# Patient Record
Sex: Male | Born: 1945 | Race: Black or African American | Hispanic: No | Smoking: Never smoker
Health system: Southern US, Community
[De-identification: ages and names within clinical notes are randomized; demographics above are authoritative.]

## PROBLEM LIST (undated history)

## (undated) DIAGNOSIS — I639 Cerebral infarction, unspecified: Secondary | ICD-10-CM

## (undated) DIAGNOSIS — E559 Vitamin D deficiency, unspecified: Secondary | ICD-10-CM

## (undated) DIAGNOSIS — Z550 Illiteracy and low-level literacy: Secondary | ICD-10-CM

## (undated) DIAGNOSIS — I10 Essential (primary) hypertension: Secondary | ICD-10-CM

## (undated) DIAGNOSIS — Z1810 Retained metal fragments, unspecified: Secondary | ICD-10-CM

## (undated) DIAGNOSIS — I2699 Other pulmonary embolism without acute cor pulmonale: Secondary | ICD-10-CM

## (undated) DIAGNOSIS — I5022 Chronic systolic (congestive) heart failure: Secondary | ICD-10-CM

## (undated) DIAGNOSIS — R7303 Prediabetes: Secondary | ICD-10-CM

## (undated) DIAGNOSIS — E785 Hyperlipidemia, unspecified: Secondary | ICD-10-CM

## (undated) DIAGNOSIS — I2510 Atherosclerotic heart disease of native coronary artery without angina pectoris: Secondary | ICD-10-CM

## (undated) DIAGNOSIS — M109 Gout, unspecified: Secondary | ICD-10-CM

## (undated) DIAGNOSIS — Z8673 Personal history of transient ischemic attack (TIA), and cerebral infarction without residual deficits: Secondary | ICD-10-CM

## (undated) DIAGNOSIS — I119 Hypertensive heart disease without heart failure: Secondary | ICD-10-CM

## (undated) DIAGNOSIS — I429 Cardiomyopathy, unspecified: Secondary | ICD-10-CM

## (undated) HISTORY — DX: Retained metal fragments, unspecified: Z18.10

## (undated) HISTORY — DX: Cerebral infarction, unspecified: I63.9

## (undated) HISTORY — DX: Other pulmonary embolism without acute cor pulmonale: I26.99

## (undated) HISTORY — DX: Vitamin D deficiency, unspecified: E55.9

## (undated) HISTORY — DX: Prediabetes: R73.03

## (undated) HISTORY — DX: Hyperlipidemia, unspecified: E78.5

## (undated) HISTORY — DX: Gout, unspecified: M10.9

---

## 1979-01-14 HISTORY — PX: OTHER SURGICAL HISTORY: SHX169

## 1988-05-15 HISTORY — PX: REVISION TOTAL HIP ARTHROPLASTY: SHX766

## 2006-06-23 ENCOUNTER — Emergency Department (HOSPITAL_COMMUNITY): Admission: EM | Admit: 2006-06-23 | Discharge: 2006-06-23 | Payer: Self-pay | Admitting: Family Medicine

## 2011-10-14 DIAGNOSIS — I639 Cerebral infarction, unspecified: Secondary | ICD-10-CM

## 2011-10-14 HISTORY — DX: Cerebral infarction, unspecified: I63.9

## 2011-10-17 ENCOUNTER — Emergency Department (HOSPITAL_COMMUNITY): Payer: 59

## 2011-10-17 ENCOUNTER — Encounter (HOSPITAL_COMMUNITY): Payer: Self-pay | Admitting: Emergency Medicine

## 2011-10-17 ENCOUNTER — Inpatient Hospital Stay (HOSPITAL_COMMUNITY)
Admission: EM | Admit: 2011-10-17 | Discharge: 2011-10-20 | DRG: 065 | Disposition: A | Discharge status: Home / Self Care | Payer: 59 | Source: Ambulatory Visit | Attending: Internal Medicine | Admitting: Internal Medicine

## 2011-10-17 DIAGNOSIS — Z91199 Patient's noncompliance with other medical treatment and regimen due to unspecified reason: Secondary | ICD-10-CM

## 2011-10-17 DIAGNOSIS — I2789 Other specified pulmonary heart diseases: Secondary | ICD-10-CM | POA: Diagnosis present

## 2011-10-17 DIAGNOSIS — I10 Essential (primary) hypertension: Secondary | ICD-10-CM | POA: Diagnosis present

## 2011-10-17 DIAGNOSIS — D696 Thrombocytopenia, unspecified: Secondary | ICD-10-CM | POA: Diagnosis present

## 2011-10-17 DIAGNOSIS — M311 Thrombotic microangiopathy: Secondary | ICD-10-CM

## 2011-10-17 DIAGNOSIS — I6350 Cerebral infarction due to unspecified occlusion or stenosis of unspecified cerebral artery: Principal | ICD-10-CM | POA: Diagnosis present

## 2011-10-17 DIAGNOSIS — Z9119 Patient's noncompliance with other medical treatment and regimen: Secondary | ICD-10-CM

## 2011-10-17 DIAGNOSIS — I639 Cerebral infarction, unspecified: Secondary | ICD-10-CM

## 2011-10-17 DIAGNOSIS — Z8249 Family history of ischemic heart disease and other diseases of the circulatory system: Secondary | ICD-10-CM

## 2011-10-17 DIAGNOSIS — I6340 Cerebral infarction due to embolism of unspecified cerebral artery: Secondary | ICD-10-CM

## 2011-10-17 DIAGNOSIS — I428 Other cardiomyopathies: Secondary | ICD-10-CM | POA: Diagnosis present

## 2011-10-17 DIAGNOSIS — Z833 Family history of diabetes mellitus: Secondary | ICD-10-CM

## 2011-10-17 HISTORY — DX: Essential (primary) hypertension: I10

## 2011-10-17 LAB — CBC
HCT: 43 % (ref 39.0–52.0)
MCH: 30.4 pg (ref 26.0–34.0)
MCHC: 34.2 g/dL (ref 30.0–36.0)
MCV: 89 fL (ref 78.0–100.0)
Platelets: 147 10*3/uL — ABNORMAL LOW (ref 150–400)
RDW: 13.3 % (ref 11.5–15.5)

## 2011-10-17 LAB — BASIC METABOLIC PANEL
CO2: 24 mEq/L (ref 19–32)
Calcium: 9.4 mg/dL (ref 8.4–10.5)
Creatinine, Ser: 1.36 mg/dL — ABNORMAL HIGH (ref 0.50–1.35)
GFR calc Af Amer: 62 mL/min — ABNORMAL LOW (ref >90)
GFR calc non Af Amer: 54 mL/min — ABNORMAL LOW (ref >90)
Sodium: 141 mEq/L (ref 135–145)

## 2011-10-17 LAB — DIFFERENTIAL
Basophils Absolute: 0 10*3/uL (ref 0.0–0.1)
Basophils Relative: 0 % (ref 0–1)
Eosinophils Absolute: 0.3 10*3/uL (ref 0.0–0.7)
Eosinophils Relative: 3 % (ref 0–5)
Monocytes Absolute: 1 10*3/uL (ref 0.1–1.0)

## 2011-10-17 MED ORDER — SODIUM CHLORIDE 0.9 % IV SOLN: INTRAVENOUS | Status: AC

## 2011-10-17 MED ORDER — ASPIRIN 300 MG RE SUPP
300.0000 mg | Freq: Every day | RECTAL | Status: DC
  Filled 2011-10-17 (×2): qty 1

## 2011-10-17 MED ORDER — SENNOSIDES-DOCUSATE SODIUM 8.6-50 MG PO TABS: 1.0000 | ORAL_TABLET | Freq: Every evening | ORAL | Status: DC | PRN

## 2011-10-17 MED ORDER — ENOXAPARIN SODIUM 30 MG/0.3ML ~~LOC~~ SOLN
30.0000 mg | SUBCUTANEOUS | Status: DC
  Administered 2011-10-18: 30 mg via SUBCUTANEOUS
  Filled 2011-10-17 (×2): qty 0.3

## 2011-10-17 MED ORDER — ASPIRIN 325 MG PO TABS
325.0000 mg | ORAL_TABLET | Freq: Every day | ORAL | Status: DC
  Administered 2011-10-18 – 2011-10-20 (×3): 325 mg via ORAL
  Filled 2011-10-17 (×4): qty 1

## 2011-10-17 MED ORDER — SODIUM CHLORIDE 0.9 % IV SOLN
INTRAVENOUS | Status: DC
Start: 2011-10-17 — End: 2011-10-19
  Administered 2011-10-18 – 2011-10-19 (×4): via INTRAVENOUS

## 2011-10-17 MED ORDER — ACETAMINOPHEN 325 MG PO TABS
650.0000 mg | ORAL_TABLET | Freq: Once | ORAL | Status: AC
  Administered 2011-10-17: 650 mg via ORAL
  Filled 2011-10-17: qty 2
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
## 2011-10-17 NOTE — ED Notes (Signed)
Patient assigned CDU 10  This nurse voiced concern for patient being away from the nurses station after being told of his inability to control the right leg.  Patient alert and oriented but continues to request to go home.  Jon notified of this nurses concerns.

## 2011-10-17 NOTE — ED Notes (Signed)
Patient states if he covers his right eye he sees 1 person on the TV, if he covers his left eye he sees 1 person on the TV but when both eyes uncovered he sees 2 people on the TV, his right eye wonders at times.

## 2011-10-17 NOTE — ED Notes (Signed)
Pt hx of Hypertension.  No meds .  Blurred vision, dizziness and hypertension symptoms since 7am today.  Denies n/v d.  18 Left forearm. 12 lead no significant changes.  Pt report porks rines that seemed to begin process

## 2011-10-17 NOTE — ED Notes (Signed)
X ray stated when they stood him up for his xray, his right leg was no cooperating.  Stated his leg was limp and did not have control of the leg.  Dr. Bebe Shaggy notified.
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
## 2011-10-17 NOTE — ED Notes (Signed)
Report given to Samantha

## 2011-10-17 NOTE — ED Notes (Signed)
Patient presented via EMS with c/o blurred vision and hypertension.  Stated he ate some pork rines and then he noticed feeling like his pressure was up.  Has history of same but has not been taking any medication. Denies headache, nausea or vomiting.

## 2011-10-18 ENCOUNTER — Encounter (HOSPITAL_COMMUNITY): Payer: Self-pay | Admitting: General Practice

## 2011-10-18 DIAGNOSIS — M311 Thrombotic microangiopathy: Secondary | ICD-10-CM

## 2011-10-18 DIAGNOSIS — I10 Essential (primary) hypertension: Secondary | ICD-10-CM

## 2011-10-18 DIAGNOSIS — I369 Nonrheumatic tricuspid valve disorder, unspecified: Secondary | ICD-10-CM

## 2011-10-18 DIAGNOSIS — I6340 Cerebral infarction due to embolism of unspecified cerebral artery: Secondary | ICD-10-CM

## 2011-10-18 DIAGNOSIS — Z9119 Patient's noncompliance with other medical treatment and regimen: Secondary | ICD-10-CM

## 2011-10-18 LAB — COMPREHENSIVE METABOLIC PANEL
BUN: 16 mg/dL (ref 6–23)
Calcium: 9.4 mg/dL (ref 8.4–10.5)
Creatinine, Ser: 1.28 mg/dL (ref 0.50–1.35)
GFR calc Af Amer: 67 mL/min — ABNORMAL LOW (ref >90)
Glucose, Bld: 118 mg/dL — ABNORMAL HIGH (ref 70–99)
Total Protein: 7 g/dL (ref 6.0–8.3)

## 2011-10-18 LAB — LIPID PANEL
Cholesterol: 146 mg/dL (ref 0–200)
Total CHOL/HDL Ratio: 2.5 RATIO
Triglycerides: 65 mg/dL (ref <150)
VLDL: 14 mg/dL (ref 0–40)

## 2011-10-18 MED ORDER — HYDRALAZINE HCL 20 MG/ML IJ SOLN
10.0000 mg | Freq: Four times a day (QID) | INTRAMUSCULAR | Status: DC | PRN
  Filled 2011-10-18: qty 0.5

## 2011-10-18 MED ORDER — HYDRALAZINE HCL 20 MG/ML IJ SOLN
10.0000 mg | Freq: Once | INTRAMUSCULAR | Status: AC
  Administered 2011-10-18: 10 mg via INTRAVENOUS
  Filled 2011-10-18: qty 0.5

## 2011-10-18 MED ORDER — AMLODIPINE BESYLATE 5 MG PO TABS
5.0000 mg | ORAL_TABLET | Freq: Every day | ORAL | Status: DC
  Administered 2011-10-18: 5 mg via ORAL
  Filled 2011-10-18 (×2): qty 1

## 2011-10-18 MED ORDER — ATORVASTATIN CALCIUM 10 MG PO TABS
10.0000 mg | ORAL_TABLET | Freq: Every day | ORAL | Status: DC
  Administered 2011-10-18 – 2011-10-19 (×2): 10 mg via ORAL
  Filled 2011-10-18 (×3): qty 1

## 2011-10-18 NOTE — ED Provider Notes (Signed)
Medical screening examination/treatment/procedure(s) were conducted as a shared visit with non-physician practitioner(s) and myself.  I personally evaluated the patient during the encounter   Joya Gaskins, MD 10/18/11 [phone]

## 2011-10-18 NOTE — Progress Notes (Signed)
SLP Cancellation Note  Evaluation cancelled today due to patient in MRI. Will f/u.  [name] MA, CCC-SLP [phone]   [name] [name] 10/18/2011, 9:38 AM

## 2011-10-18 NOTE — Progress Notes (Signed)
*  PRELIMINARY RESULTS* Vascular Ultrasound Carotid Duplex (Doppler) has been completed.  Preliminary findings: Bilateral no significant ICA stenosis with antegrade vertebral flow.  Farrel Demark, RDMS 10/18/2011, 9:52 AM
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
## 2011-10-18 NOTE — Progress Notes (Signed)
  Echocardiogram 2D Echocardiogram has been performed.  Dennis [name] [facility] 10/18/2011, 9:40 AM

## 2011-10-18 NOTE — Progress Notes (Signed)
Occupational Therapy Evaluation Patient Details Name: Dennis [name] MRN: [id number] DOB: January 10, 1946 Today's Date: 10/18/2011 Time: 1100-1119 OT Time Calculation (min): 19 min  OT Assessment / Plan / Recommendation Clinical Impression  Pt s/p cerebellar CVA and presenting with double vision. Pt will benefit from acute OT to address below problem list in prep for d/c home with assist from significant other.     OT Assessment  Patient needs continued OT Services    Follow Up Recommendations  Home health OT;Supervision/Assistance - 24 hour    Barriers to Discharge      Equipment Recommendations  None recommended by PT    Recommendations for Other Services    Frequency  Min 3X/week    Precautions / Restrictions Precautions Precautions: None Restrictions Weight Bearing Restrictions: No   Pertinent Vitals/Pain See vitals     ADL  Lower Body Bathing: Simulated;Modified independent Where Assessed - Lower Body Bathing: Unsupported sit to stand Lower Body Dressing: Modified independent;Performed Where Assessed - Lower Body Dressing: Unsupported sit to stand Toilet Transfer: Performed;Supervision/safety Toilet Transfer Method: Sit to Barista:  (bed) Equipment Used: Gait belt Transfers/Ambulation Related to ADLs: Supervision due to lines and leads.   ADL Comments: Mod I for increased time due to lines and leads and due to blurred/double vision.      OT Diagnosis: Generalized weakness;Disturbance of vision  OT Problem List: Impaired balance (sitting and/or standing);Impaired vision/perception OT Treatment Interventions: Self-care/ADL training;Therapeutic activities;Patient/family education;Balance training;Visual/perceptual remediation/compensation   OT Goals Acute Rehab OT Goals OT Goal Formulation: With patient Time For Goal Achievement: 10/25/11 Potential to Achieve Goals: Good Miscellaneous OT Goals Miscellaneous OT Goal #1: Pt will retrieve ADL  items with mod I. OT Goal: Miscellaneous Goal #1 - Progress: Goal set today Miscellaneous OT Goal #2: Pt will independently demosntrate proper eye patch wearing protocol. OT Goal: Miscellaneous Goal #2 - Progress: Goal set today  Visit Information  Last OT Received On: 10/18/11 Assistance Needed: +1    Subjective Data      Prior Functioning  Home Living Lives With: Significant other Available Help at Discharge: Available 24 hours/day (girlfriend) Type of Home: House Home Access: Stairs to enter [employer] of Steps: 2 Entrance Stairs-Rails: Right Home Layout: One level Bathroom Shower/Tub: Engineer, manufacturing systems: Standard Home Adaptive Equipment: None Prior Function Level of Independence: Independent Able to Take Stairs?: Yes Driving: Yes Vocation: Part time employment Comments: 8 hrs/ week supervising at [employer] Communication: No difficulties Dominant Hand: Right    Cognition  Overall Cognitive Status: Appears within functional limits for tasks assessed/performed Arousal/Alertness: Awake/alert Orientation Level: Appears intact for tasks assessed Behavior During Session: [facility] for tasks performed Cognition - Other Comments: Slow to process information/commands but still WFL. Question if this is baseline.    Extremity/Trunk Assessment Right Upper Extremity Assessment RUE ROM/Strength/Tone: Within functional levels RUE Sensation: WFL - Light Touch;WFL - Proprioception RUE Coordination: WFL - gross/fine motor Left Upper Extremity Assessment LUE ROM/Strength/Tone: Within functional levels LUE Sensation: WFL - Light Touch;WFL - Proprioception LUE Coordination: WFL - gross/fine motor  Trunk Assessment Trunk Assessment: Normal   Mobility Bed Mobility Bed Mobility: Supine to Sit;Sitting - Scoot to Edge of Bed;Sit to Supine Supine to Sit: 7: Independent;HOB flat Sitting - Scoot to Edge of Bed: 7: Independent Sit to Supine: 7:  Independent;HOB flat Details for Bed Mobility Assistance: HOB 25* Transfers Sit to Stand: 5: Supervision;From bed Stand to Sit: 5: Supervision;To bed Details for Transfer Assistance: supervision for safety due  to lines and leads   Exercise    Balance   End of Session OT - End of Session Equipment Utilized During Treatment: Gait belt Activity Tolerance: Patient tolerated treatment well Patient left: in bed;with call bell/phone within reach;with bed alarm set Nurse Communication: Mobility status (need for eye patch)  10/18/2011 Cipriano Mile OTR/L Pager [phone] Office [phone]  Cipriano Mile 10/18/2011, 1:15 PM
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
## 2011-10-18 NOTE — Care Management Note (Signed)
    Page 1 of 1   10/20/2011     12:36:52 PM   CARE MANAGEMENT NOTE 10/20/2011  Patient:  Dennis [name], Dennis [name]   Account Number:  [id number]  Date Initiated:  10/18/2011  Documentation initiated by:  Onnie Boer  Subjective/Objective Assessment:   PT WAS ADMITTED WITH CVA     Action/Plan:   PROGRESSION OF CARE AND DISCHARGE PLANNING   Anticipated DC Date:  10/20/2011   Anticipated DC Plan:  HOME W HOME HEALTH SERVICES      DC Planning Services  CM consult      Choice offered to / List presented to:  C-1 Patient        HH arranged  HH-3 OT      [facility] agency  [facility]   Status of service:  Completed, signed off Medicare Important Message given?   (If response is "NO", the following Medicare IM given date fields will be blank) Date Medicare IM given:   Date Additional Medicare IM given:    Discharge Disposition:  HOME W HOME HEALTH SERVICES  Per UR Regulation:  Reviewed for med. necessity/level of care/duration of stay  If discussed at Long Length of Stay Meetings, dates discussed:    Comments:  10/20/11 Onnie Boer, RN, BSN 1236 PT WAS DC'D TO HOME WITH HH OT WITH [facility]  10/18/11 Onnie Boer, RN, BSN 1023 PT WAS ADMITTED WITH CVA, PTA PT WAS AT HOME WITH SELF CARE.  WILL F/U ON DC NEEDS

## 2011-10-18 NOTE — Progress Notes (Signed)
OT Cancellation Note  Treatment cancelled today due to patient receiving procedure (doppler).  Will re-attempt later today when pt returns to room.    10/18/2011 Cipriano Mile OTR/L Pager [phone] Office [phone]

## 2011-10-19 DIAGNOSIS — I6340 Cerebral infarction due to embolism of unspecified cerebral artery: Secondary | ICD-10-CM

## 2011-10-19 DIAGNOSIS — Z9119 Patient's noncompliance with other medical treatment and regimen: Secondary | ICD-10-CM

## 2011-10-19 DIAGNOSIS — M311 Thrombotic microangiopathy: Secondary | ICD-10-CM

## 2011-10-19 DIAGNOSIS — I10 Essential (primary) hypertension: Secondary | ICD-10-CM

## 2011-10-19 LAB — CBC
HCT: 40.4 % (ref 39.0–52.0)
Hemoglobin: 13.9 g/dL (ref 13.0–17.0)
WBC: 8 10*3/uL (ref 4.0–10.5)

## 2011-10-19 LAB — PRO B NATRIURETIC PEPTIDE: Pro B Natriuretic peptide (BNP): 2522 pg/mL — ABNORMAL HIGH (ref 0–125)

## 2011-10-19 MED ORDER — HYDROCHLOROTHIAZIDE 12.5 MG PO CAPS
12.5000 mg | ORAL_CAPSULE | Freq: Every day | ORAL | Status: DC
  Administered 2011-10-19 – 2011-10-20 (×2): 12.5 mg via ORAL
  Filled 2011-10-19 (×2): qty 1

## 2011-10-19 MED ORDER — CARVEDILOL 3.125 MG PO TABS
3.1250 mg | ORAL_TABLET | Freq: Two times a day (BID) | ORAL | Status: DC
  Administered 2011-10-19 – 2011-10-20 (×3): 3.125 mg via ORAL
  Filled 2011-10-19 (×5): qty 1

## 2011-10-19 MED ORDER — AMLODIPINE BESYLATE 10 MG PO TABS
10.0000 mg | ORAL_TABLET | Freq: Every day | ORAL | Status: DC
  Administered 2011-10-19: 10 mg via ORAL
  Filled 2011-10-19: qty 1

## 2011-10-19 MED ORDER — LISINOPRIL 10 MG PO TABS
10.0000 mg | ORAL_TABLET | Freq: Every day | ORAL | Status: DC
  Administered 2011-10-19 – 2011-10-20 (×2): 10 mg via ORAL
  Filled 2011-10-19 (×2): qty 1

## 2011-10-19 MED ORDER — ENOXAPARIN SODIUM 40 MG/0.4ML ~~LOC~~ SOLN
40.0000 mg | SUBCUTANEOUS | Status: DC
  Administered 2011-10-19: 40 mg via SUBCUTANEOUS
  Filled 2011-10-19 (×2): qty 0.4

## 2011-10-19 MED ORDER — AMLODIPINE BESYLATE 5 MG PO TABS
5.0000 mg | ORAL_TABLET | Freq: Every day | ORAL | Status: DC
  Administered 2011-10-20: 5 mg via ORAL
  Filled 2011-10-19: qty 1

## 2011-10-19 NOTE — Discharge Instructions (Signed)
STROKE/TIA DISCHARGE INSTRUCTIONS SMOKING Cigarette smoking nearly doubles your risk of having a stroke & is the single most alterable risk factor  If you smoke or have smoked in the last 12 months, you are advised to quit smoking for your health.  Most of the excess cardiovascular risk related to smoking disappears within a year of stopping.  Ask you doctor about anti-smoking medications  [place] Quit Line: 1-800-QUIT NOW  Free Smoking Cessation Classes [phone]  CHOLESTEROL Know your levels; limit fat & cholesterol in your diet  Lipid Panel     Component Value Date/Time   CHOL 146 10/18/2011 1124   TRIG 65 10/18/2011 1124   HDL 57 10/18/2011 1124   CHOLHDL 2.6 10/18/2011 1124   VLDL 13 10/18/2011 1124   LDLCALC 76 10/18/2011 1124      Many patients benefit from treatment even if their cholesterol is at goal.  Goal: Total Cholesterol (CHOL) less than 160  Goal:  Triglycerides (TRIG) less than 150  Goal:  HDL greater than 40  Goal:  LDL (LDLCALC) less than 100   BLOOD PRESSURE American Stroke Association blood pressure target is less that 120/80 mm/Hg  Your discharge blood pressure is:  BP: 145/103 mmHg  Monitor your blood pressure  Limit your salt and alcohol intake  Many individuals will require more than one medication for high blood pressure  DIABETES (A1c is a blood sugar average for last 3 months) Goal HGBA1c is under 7% (HBGA1c is blood sugar average for last 3 months)  Diabetes: {STROKE DC DIABETES:22357}    Lab Results  Component Value Date   HGBA1C 5.5 10/18/2011     Your HGBA1c can be lowered with medications, healthy diet, and exercise.  Check your blood sugar as directed by your physician  Call your physician if you experience unexplained or low blood sugars.  PHYSICAL ACTIVITY/REHABILITATION Goal is 30 minutes at least 4 days per week    {STROKE DC ACTIVITY/REHAB:22359}  Activity decreases your risk of heart attack and stroke and makes your heart stronger.  It  helps control your weight and blood pressure; helps you relax and can improve your mood.  Participate in a regular exercise program.  Talk with your doctor about the best form of exercise for you (dancing, walking, swimming, cycling).  DIET/WEIGHT Goal is to maintain a healthy weight  Your discharge diet is: Cardiac *** liquids Your height is:  Height: 5\' 9"  (175.3 cm) Your current weight is: Weight: 73.4 kg (161 lb 13.1 oz) Your Body Mass Index (BMI) is:  BMI (Calculated): 23.9   Following the type of diet specifically designed for you will help prevent another stroke.  Your goal weight range is:  ***  Your goal Body Mass Index (BMI) is 19-24.  Healthy food habits can help reduce 3 risk factors for stroke:  High cholesterol, hypertension, and excess weight.  RESOURCES Stroke/Support Group:  Call [phone]  they meet the 3rd Sunday of the month on the Rehab Unit at [facility], [place] ( no meetings June, July & Aug).  STROKE EDUCATION PROVIDED/REVIEWED AND GIVEN TO PATIENT Stroke warning signs and symptoms How to activate emergency medical system (call 911). Medications prescribed at discharge. Need for follow-up after discharge. Personal risk factors for stroke. Pneumonia vaccine given:   {STROKE DC YES/NO/DATE:22363} Flu vaccine given:   {STROKE DC YES/NO/DATE:22363} My questions have been answered, the writing is legible, and I understand these instructions.  I will adhere to these goals & educational materials that have  been provided to me after my discharge from the hospital.
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
## 2011-10-19 NOTE — Progress Notes (Signed)
BP 145/103, patient asymptomatic, MD made aware- no order noted.

## 2011-10-19 NOTE — Progress Notes (Signed)
Physical Therapy Treatment Patient Details Name: LENZY KERSCHNER MRN: [id number] DOB: 12-09-1945 Today's Date: 10/19/2011 Time: 7829-5621 PT Time Calculation (min): 12 min  PT Assessment / Plan / Recommendation Comments on Treatment Session  Patient progressing well. Per daughter patient at baseline for mobility    Follow Up Recommendations  Home health PT (safety eval)    Barriers to Discharge        Equipment Recommendations  None recommended by PT    Recommendations for Other Services    Frequency     Plan All goals met and education completed, patient dischaged from PT services    Precautions / Restrictions     Pertinent Vitals/Pain     Mobility  Bed Mobility Supine to Sit: 7: Independent Sitting - Scoot to Edge of Bed: 7: Independent Transfers Sit to Stand: 7: Independent Stand to Sit: 7: Independent Ambulation/Gait Ambulation/Gait Assistance: 6: Modified independent (Device/Increase time) Ambulation Distance (Feet): 450 Feet Assistive device: None Gait Pattern: Within Functional Limits Stairs: Yes Stairs Assistance: 6: Modified independent (Device/Increase time) Stair Management Technique: One rail Right Number of Stairs: 10     Exercises     PT Diagnosis:    PT Problem List:   PT Treatment Interventions:     PT Goals Acute Rehab PT Goals PT Goal: Ambulate - Progress: Met PT Goal: Up/Down Stairs - Progress: Met  Visit Information  Last PT Received On: 10/19/11 Assistance Needed: +1    Subjective Data      Cognition  Overall Cognitive Status: Appears within functional limits for tasks assessed/performed Arousal/Alertness: Awake/alert Orientation Level: Appears intact for tasks assessed Behavior During Session: [facility] for tasks performed    Balance     End of Session PT - End of Session Activity Tolerance: Patient tolerated treatment well Patient left: in bed Nurse Communication: Mobility status    Fredrich Birks 10/19/2011, 2:48  PM 10/19/2011 Fredrich Birks PTA [phone] pager [phone] office
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
## 2011-10-20 DIAGNOSIS — I10 Essential (primary) hypertension: Secondary | ICD-10-CM

## 2011-10-20 DIAGNOSIS — R269 Unspecified abnormalities of gait and mobility: Secondary | ICD-10-CM

## 2011-10-20 DIAGNOSIS — I6340 Cerebral infarction due to embolism of unspecified cerebral artery: Secondary | ICD-10-CM

## 2011-10-20 DIAGNOSIS — H532 Diplopia: Secondary | ICD-10-CM

## 2011-10-20 LAB — BASIC METABOLIC PANEL
CO2: 27 mEq/L (ref 19–32)
Calcium: 9.6 mg/dL (ref 8.4–10.5)
Chloride: 103 mEq/L (ref 96–112)
Sodium: 139 mEq/L (ref 135–145)

## 2011-10-20 MED ORDER — LISINOPRIL 10 MG PO TABS: 10.0000 mg | ORAL_TABLET | Freq: Every day | ORAL | Status: DC

## 2011-10-20 MED ORDER — HYDROCHLOROTHIAZIDE 12.5 MG PO CAPS: 12.5000 mg | ORAL_CAPSULE | Freq: Every day | ORAL | Status: DC

## 2011-10-20 MED ORDER — ATORVASTATIN CALCIUM 10 MG PO TABS: 10.0000 mg | ORAL_TABLET | Freq: Every day | ORAL | Status: DC

## 2011-10-20 MED ORDER — CARVEDILOL 6.25 MG PO TABS: 6.2500 mg | ORAL_TABLET | Freq: Two times a day (BID) | ORAL | Status: DC

## 2011-10-20 MED ORDER — CARVEDILOL 3.125 MG PO TABS: 6.2500 mg | ORAL_TABLET | Freq: Two times a day (BID) | ORAL | Status: DC

## 2011-10-20 MED ORDER — ASPIRIN 325 MG PO TABS: 325.0000 mg | ORAL_TABLET | Freq: Every day | ORAL | Status: AC

## 2011-10-20 NOTE — Progress Notes (Signed)
Occupational Therapy Treatment Patient Details Name: Dennis [name] MRN: [id number] DOB: August 16, 1945 Today's Date: 10/20/2011 Time: 4098-1191 OT Time Calculation (min): 13 min  OT Assessment / Plan / Recommendation Comments on Treatment Session Pt reports that he continues to experience blurry vision.  Upon OT arrival, pt with gauze patch over left eye.  OT ordered eye patch for pt via Geographical information systems officer.  Educated pt on eye patch wearing schedule and to leave dominant eye (right eye) uncovered during transfers/ambulation.  Further OT needs can be addressed by [name] upon d/c home.    Follow Up Recommendations  Home health OT;Supervision/Assistance - 24 hour    Barriers to Discharge       Equipment Recommendations  None recommended by OT    Recommendations for Other Services    Frequency     Plan Discharge plan remains appropriate;All goals met and education completed, patient discharged from OT services    Precautions / Restrictions Restrictions Weight Bearing Restrictions: No   Pertinent Vitals/Pain See vitals    ADL  Grooming: Performed;Wash/dry hands;Wash/dry face;Modified independent Where Assessed - Grooming: Unsupported standing Transfers/Ambulation Related to ADLs: Pt ambulated throughout room with mod I for increased time due to lines. ADL Comments: Pt retrieved washcloths and towels from lower table shelves with no loss of balance.    OT Diagnosis:    OT Problem List:   OT Treatment Interventions:     OT Goals Miscellaneous OT Goals Miscellaneous OT Goal #1: Pt will retrieve ADL items with mod I. OT Goal: Miscellaneous Goal #1 - Progress: Met Miscellaneous OT Goal #2: Pt will independently demosntrate proper eye patch wearing protocol. OT Goal: Miscellaneous Goal #2 - Progress: Met  Visit Information  Last OT Received On: 10/20/11    Subjective Data      Prior Functioning       Cognition  Overall Cognitive Status: Appears within functional limits for tasks  assessed/performed Arousal/Alertness: Awake/alert Orientation Level: Appears intact for tasks assessed Behavior During Session: [facility] for tasks performed    Mobility Bed Mobility Bed Mobility: Supine to Sit;Sitting - Scoot to Edge of Bed;Sit to Supine Supine to Sit: 7: Independent;HOB flat Sitting - Scoot to Edge of Bed: 7: Independent Sit to Supine: 7: Independent;HOB flat Transfers Transfers: Sit to Stand;Stand to Sit Sit to Stand: 7: Independent;From bed Stand to Sit: 7: Independent;To bed   Exercises    Balance Dynamic Standing Balance Dynamic Standing - Balance Support: No upper extremity supported Dynamic Standing - Level of Assistance: 7: Independent Dynamic Standing - Balance Activities: Reaching for objects Dynamic Standing - Comments: Pt reaching for ADL items for grooming.  End of Session OT - End of Session Activity Tolerance: Patient tolerated treatment well Patient left: in bed;with call bell/phone within reach Nurse Communication: Other (comment) (ordered eye patch)  10/20/2011 Cipriano Mile OTR/L Pager [phone] Office [phone]  Cipriano Mile 10/20/2011, 11:34 AM
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
## 2011-10-20 NOTE — Progress Notes (Signed)
Patient Name: Dennis [name] Date of Encounter: 10/20/2011    SUBJECTIVE:Recurrent double vision over night. No cardiac complaints.  TELEMETRY:  NSR: Filed Vitals:   10/19/11 2200 10/20/11 0200 10/20/11 0600 10/20/11 1010  BP: 116/76 108/69 115/77 135/87  Pulse: 63 50 49 61  Temp: 98 F (36.7 C) 98.2 F (36.8 C) 97.7 F (36.5 C) 98.3 F (36.8 C)  TempSrc:    Oral  Resp: 16 16 16 18   Height:      Weight:      SpO2: 98% 98% 93% 97%    Intake/Output Summary (Last 24 hours) at 10/20/11 1127 Last data filed at 10/20/11 1012  Gross per 24 hour  Intake    358 ml  Output      0 ml  Net    358 ml    LABS: Basic Metabolic Panel:  Basename 10/20/11 0620 10/18/11 1124  NA 139 139  K 4.1 4.3  CL 103 108  CO2 27 23  GLUCOSE 85 118*  BUN 11 16  CREATININE 1.45* 1.28  CALCIUM 9.6 9.4  MG -- --  PHOS -- --   CBC:  Basename 10/19/11 0640 10/17/11 2025  WBC 8.0 8.9  NEUTROABS -- 5.8  HGB 13.9 14.7  HCT 40.4 43.0  MCV 89.0 89.0  PLT 129* 147*   BNP    Component Value Date/Time   PROBNP 2522.0* 10/19/2011 1128   Hemoglobin A1C:  Basename 10/18/11 1124  HGBA1C 5.5   Fasting Lipid Panel:  Basename 10/18/11 1124  CHOL 146  HDL 57  LDLCALC 76  TRIG 65  CHOLHDL 2.6  LDLDIRECT --    Radiology/Studies:  No new.  Physical Exam: Blood pressure 135/87, pulse 61, temperature 98.3 F (36.8 C), temperature source Oral, resp. rate 18, height 5\' 9"  (1.753 m), weight 73.4 kg (161 lb 13.1 oz), SpO2 97.00%. Weight change:    S4 gallop. Clear lungs  ASSESSMENT:  1. Acute on chronic systolic heart failure  2. Recent CVA  3. Hypertension still not well controlled.   Plan:  1. Med optimization as an OP. Will need better diuresis but in setting of CVA will get further out before more aggressive therapy  2. Plan OV with lab work in 5-7 days.  Selinda Eon 10/20/2011, 11:27 AM
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
## 2011-11-09 LAB — HM DIABETES EYE EXAM: HM Diabetic Eye Exam: NORMAL

## 2012-05-17 ENCOUNTER — Other Ambulatory Visit: Payer: Self-pay | Admitting: Orthopaedic Surgery

## 2012-06-05 ENCOUNTER — Encounter (HOSPITAL_BASED_OUTPATIENT_CLINIC_OR_DEPARTMENT_OTHER): Payer: Self-pay | Admitting: *Deleted
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
## 2012-06-05 NOTE — Progress Notes (Signed)
Daughter to bring him in for bmet-ekg 6/13-called for notes Dr Astrid Divine and Dr Katrinka Blazing

## 2012-06-06 NOTE — Progress Notes (Signed)
Since Dr crews wanted cardiac follow up from hospital visit 6/13-pt never went back to see dr Simeon Craft showed for labs Pt has OV with dr Katrinka Blazing 06/20/12 at 11:15am-daughter Consuela [phone] has that information and will take him-surgery will have to be reschedualled after clearance. Agustin Cree at Dr Jerl Santos office noticed.

## 2012-06-06 NOTE — Progress Notes (Signed)
Reviewed notes from Pt PCP and Dr smith-follow up stoke 6/13-pt never did go see Dr Mendel Ryder after hospital.  Dr Ivin Booty says pt needs to see cardiology before surgery for clearance. Dennis [name]-[facility] sch for Dr Jerl Santos notified. Did let pt know this needed to be done before Tuesday or surgery would need to be r/s.

## 2012-06-11 ENCOUNTER — Encounter (HOSPITAL_BASED_OUTPATIENT_CLINIC_OR_DEPARTMENT_OTHER): Admission: RE | Payer: Self-pay | Source: Ambulatory Visit

## 2012-06-11 ENCOUNTER — Ambulatory Visit (HOSPITAL_BASED_OUTPATIENT_CLINIC_OR_DEPARTMENT_OTHER): Admission: RE | Admit: 2012-06-11 | Payer: 59 | Source: Ambulatory Visit | Admitting: Orthopaedic Surgery

## 2012-06-11 HISTORY — DX: Illiteracy and low-level literacy: Z55.0

## 2012-06-11 HISTORY — DX: Hyperlipidemia, unspecified: E78.5

## 2012-06-11 SURGERY — BUNIONECTOMY, WITH METATARSAL OSTEOTOMY
Anesthesia: Choice | Site: Foot | Laterality: Left

## 2012-08-12 ENCOUNTER — Other Ambulatory Visit: Payer: Self-pay | Admitting: Orthopaedic Surgery
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
## 2012-08-12 NOTE — Progress Notes (Signed)
Pt surgery was r/s-needed cardiac clearance-saw dr smith-cleared- Needs istat

## 2012-08-13 ENCOUNTER — Ambulatory Visit (HOSPITAL_BASED_OUTPATIENT_CLINIC_OR_DEPARTMENT_OTHER)
Admission: RE | Admit: 2012-08-13 | Discharge: 2012-08-13 | Disposition: A | Discharge status: Home / Self Care | Payer: 59 | Source: Ambulatory Visit | Attending: Orthopaedic Surgery | Admitting: Orthopaedic Surgery

## 2012-08-13 ENCOUNTER — Encounter (HOSPITAL_BASED_OUTPATIENT_CLINIC_OR_DEPARTMENT_OTHER): Payer: Self-pay | Admitting: Anesthesiology

## 2012-08-13 ENCOUNTER — Ambulatory Visit (HOSPITAL_BASED_OUTPATIENT_CLINIC_OR_DEPARTMENT_OTHER): Payer: 59 | Admitting: Anesthesiology

## 2012-08-13 ENCOUNTER — Encounter (HOSPITAL_BASED_OUTPATIENT_CLINIC_OR_DEPARTMENT_OTHER): Payer: Self-pay | Admitting: *Deleted

## 2012-08-13 ENCOUNTER — Encounter (HOSPITAL_BASED_OUTPATIENT_CLINIC_OR_DEPARTMENT_OTHER)
Admission: RE | Disposition: A | Discharge status: Home / Self Care | Payer: Self-pay | Source: Ambulatory Visit | Attending: Orthopaedic Surgery

## 2012-08-13 DIAGNOSIS — M2010 Hallux valgus (acquired), unspecified foot: Secondary | ICD-10-CM | POA: Insufficient documentation

## 2012-08-13 DIAGNOSIS — L84 Corns and callosities: Secondary | ICD-10-CM | POA: Insufficient documentation

## 2012-08-13 DIAGNOSIS — Z8673 Personal history of transient ischemic attack (TIA), and cerebral infarction without residual deficits: Secondary | ICD-10-CM | POA: Insufficient documentation

## 2012-08-13 DIAGNOSIS — I10 Essential (primary) hypertension: Secondary | ICD-10-CM | POA: Insufficient documentation

## 2012-08-13 DIAGNOSIS — E785 Hyperlipidemia, unspecified: Secondary | ICD-10-CM | POA: Insufficient documentation

## 2012-08-13 DIAGNOSIS — M21619 Bunion of unspecified foot: Secondary | ICD-10-CM | POA: Insufficient documentation

## 2012-08-13 DIAGNOSIS — M2040 Other hammer toe(s) (acquired), unspecified foot: Secondary | ICD-10-CM | POA: Insufficient documentation

## 2012-08-13 DIAGNOSIS — M21612 Bunion of left foot: Secondary | ICD-10-CM

## 2012-08-13 DIAGNOSIS — I509 Heart failure, unspecified: Secondary | ICD-10-CM | POA: Insufficient documentation

## 2012-08-13 HISTORY — PX: TOE SURGERY: SHX1073

## 2012-08-13 HISTORY — PX: METATARSAL OSTEOTOMY WITH BUNIONECTOMY: SHX5662

## 2012-08-13 LAB — POCT I-STAT, CHEM 8
BUN: 26 mg/dL — ABNORMAL HIGH (ref 6–23)
Chloride: 107 mEq/L (ref 96–112)
Creatinine, Ser: 1.5 mg/dL — ABNORMAL HIGH (ref 0.50–1.35)
Sodium: 143 mEq/L (ref 135–145)
TCO2: 29 mmol/L (ref 0–100)

## 2012-08-13 SURGERY — BUNIONECTOMY, WITH METATARSAL OSTEOTOMY
Anesthesia: General | Site: Foot | Laterality: Left | Wound class: Clean

## 2012-08-13 MED ORDER — PROPOFOL 10 MG/ML IV BOLUS
INTRAVENOUS | Status: DC | PRN
  Administered 2012-08-13: 150 mg via INTRAVENOUS

## 2012-08-13 MED ORDER — LACTATED RINGERS IV SOLN
INTRAVENOUS | Status: DC
  Administered 2012-08-13 (×2): via INTRAVENOUS

## 2012-08-13 MED ORDER — PROMETHAZINE HCL 25 MG/ML IJ SOLN: 6.2500 mg | INTRAMUSCULAR | Status: DC | PRN

## 2012-08-13 MED ORDER — LIDOCAINE HCL (CARDIAC) 20 MG/ML IV SOLN
INTRAVENOUS | Status: DC | PRN
  Administered 2012-08-13: 50 mg via INTRAVENOUS

## 2012-08-13 MED ORDER — BUPIVACAINE-EPINEPHRINE PF 0.5-1:200000 % IJ SOLN
INTRAMUSCULAR | Status: DC | PRN
  Administered 2012-08-13: 30 mL
  Administered 2012-08-13: 10 mL

## 2012-08-13 MED ORDER — OXYCODONE HCL 5 MG PO TABS: 5.0000 mg | ORAL_TABLET | Freq: Once | ORAL | Status: DC | PRN

## 2012-08-13 MED ORDER — MIDAZOLAM HCL 2 MG/2ML IJ SOLN
0.5000 mg | Freq: Once | INTRAMUSCULAR | Status: DC | PRN
Start: 2012-08-13 — End: 2012-08-13

## 2012-08-13 MED ORDER — ACETAMINOPHEN 10 MG/ML IV SOLN
1000.0000 mg | Freq: Once | INTRAVENOUS | Status: AC
  Administered 2012-08-13: 1000 mg via INTRAVENOUS

## 2012-08-13 MED ORDER — OXYCODONE HCL 5 MG/5ML PO SOLN: 5.0000 mg | Freq: Once | ORAL | Status: DC | PRN

## 2012-08-13 MED ORDER — MIDAZOLAM HCL 2 MG/2ML IJ SOLN
1.0000 mg | INTRAMUSCULAR | Status: DC | PRN
Start: 2012-08-13 — End: 2012-08-13
  Administered 2012-08-13: 0.5 mg via INTRAVENOUS

## 2012-08-13 MED ORDER — MEPERIDINE HCL 25 MG/ML IJ SOLN: 6.2500 mg | INTRAMUSCULAR | Status: DC | PRN

## 2012-08-13 MED ORDER — DEXAMETHASONE SODIUM PHOSPHATE 4 MG/ML IJ SOLN
INTRAMUSCULAR | Status: DC | PRN
  Administered 2012-08-13: 10 mg via INTRAVENOUS

## 2012-08-13 MED ORDER — FENTANYL CITRATE 0.05 MG/ML IJ SOLN
50.0000 ug | INTRAMUSCULAR | Status: DC | PRN
  Administered 2012-08-13: 100 ug via INTRAVENOUS

## 2012-08-13 MED ORDER — OXYCODONE-ACETAMINOPHEN 5-325 MG PO TABS: 1.0000 | ORAL_TABLET | ORAL | Status: DC | PRN

## 2012-08-13 MED ORDER — FENTANYL CITRATE 0.05 MG/ML IJ SOLN: 25.0000 ug | INTRAMUSCULAR | Status: DC | PRN

## 2012-08-13 MED ORDER — CEFAZOLIN SODIUM-DEXTROSE 2-3 GM-% IV SOLR
INTRAVENOUS | Status: DC | PRN
  Administered 2012-08-13: 2 g via INTRAVENOUS

## 2012-08-13 MED ORDER — EPHEDRINE SULFATE 50 MG/ML IJ SOLN
INTRAMUSCULAR | Status: DC | PRN
  Administered 2012-08-13 (×3): 10 mg via INTRAVENOUS

## 2012-08-13 SURGICAL SUPPLY — 77 items
BANDAGE ELASTIC 3 VELCRO ST LF (GAUZE/BANDAGES/DRESSINGS) ×2 IMPLANT
BANDAGE ESMARK 6X9 LF (GAUZE/BANDAGES/DRESSINGS) ×1 IMPLANT
BANDAGE GAUZE ELAST BULKY 4 IN (GAUZE/BANDAGES/DRESSINGS) ×2 IMPLANT
BLADE AVERAGE 25X9 (BLADE) IMPLANT
BLADE CRESCENTIC 13.5X.38X32 (BLADE) IMPLANT
BLADE OSC/SAG .038X5.5 CUT EDG (BLADE) ×1 IMPLANT
BLADE SURG 15 STRL LF DISP TIS (BLADE) ×2 IMPLANT
BLADE SURG 15 STRL SS (BLADE) ×4
BNDG CMPR 9X6 STRL LF SNTH (GAUZE/BANDAGES/DRESSINGS) ×1
BNDG ESMARK 6X9 LF (GAUZE/BANDAGES/DRESSINGS) ×2
CANISTER SUCTION 1200CC (MISCELLANEOUS) IMPLANT
CAP PIN PROTECTOR ORTHO WHT (CAP) IMPLANT
COVER TABLE BACK 60X90 (DRAPES) ×2 IMPLANT
CUFF TOURNIQUET SINGLE 18IN (TOURNIQUET CUFF) IMPLANT
CUFF TOURNIQUET SINGLE 24IN (TOURNIQUET CUFF) ×1 IMPLANT
CUFF TOURNIQUET SINGLE 34IN LL (TOURNIQUET CUFF) IMPLANT
DECANTER SPIKE VIAL GLASS SM (MISCELLANEOUS) IMPLANT
DRAPE EXTREMITY T 121X128X90 (DRAPE) ×2 IMPLANT
DRAPE OEC MINIVIEW 54X84 (DRAPES) ×1 IMPLANT
DRAPE U-SHAPE 47X51 STRL (DRAPES) ×2 IMPLANT
DRESSING TELFA 8X3 (GAUZE/BANDAGES/DRESSINGS) IMPLANT
DRSG EMULSION OIL 3X3 NADH (GAUZE/BANDAGES/DRESSINGS) IMPLANT
DRSG PAD ABDOMINAL 8X10 ST (GAUZE/BANDAGES/DRESSINGS) ×2 IMPLANT
DURAPREP 26ML APPLICATOR (WOUND CARE) ×2 IMPLANT
ELECT NDL TIP 2.8 STRL (NEEDLE) ×1 IMPLANT
ELECT NEEDLE TIP 2.8 STRL (NEEDLE) ×2 IMPLANT
ELECT REM PT RETURN 9FT ADLT (ELECTROSURGICAL) ×2
ELECTRODE REM PT RTRN 9FT ADLT (ELECTROSURGICAL) ×1 IMPLANT
GAUZE SPONGE 4X4 16PLY XRAY LF (GAUZE/BANDAGES/DRESSINGS) IMPLANT
GAUZE XEROFORM 1X8 LF (GAUZE/BANDAGES/DRESSINGS) ×2 IMPLANT
GLOVE BIO SURGEON STRL SZ 6.5 (GLOVE) ×1 IMPLANT
GLOVE BIO SURGEON STRL SZ8.5 (GLOVE) ×2 IMPLANT
GLOVE BIOGEL PI IND STRL 8 (GLOVE) ×1 IMPLANT
GLOVE BIOGEL PI IND STRL 8.5 (GLOVE) ×1 IMPLANT
GLOVE BIOGEL PI INDICATOR 8 (GLOVE) ×1
GLOVE BIOGEL PI INDICATOR 8.5 (GLOVE) ×1
GLOVE ECLIPSE 6.5 STRL STRAW (GLOVE) ×2 IMPLANT
GLOVE INDICATOR 7.0 STRL GRN (GLOVE) ×3 IMPLANT
GLOVE SS BIOGEL STRL SZ 8 (GLOVE) ×1 IMPLANT
GLOVE SUPERSENSE BIOGEL SZ 8 (GLOVE) ×1
GOWN PREVENTION PLUS XLARGE (GOWN DISPOSABLE) ×3 IMPLANT
GOWN PREVENTION PLUS XXLARGE (GOWN DISPOSABLE) ×2 IMPLANT
KIT 1-PIN ORTHOSORB 1.3X40 (Pin) ×1 IMPLANT
KWIRE 4.0 X .045IN (WIRE) ×1 IMPLANT
KWIRE 4.0 X .062IN (WIRE) IMPLANT
NDL HYPO 25X1 1.5 SAFETY (NEEDLE) IMPLANT
NEEDLE HYPO 22GX1.5 SAFETY (NEEDLE) ×1 IMPLANT
NEEDLE HYPO 25X1 1.5 SAFETY (NEEDLE) IMPLANT
NS IRRIG 1000ML POUR BTL (IV SOLUTION) ×2 IMPLANT
PACK BASIN DAY SURGERY FS (CUSTOM PROCEDURE TRAY) ×2 IMPLANT
PADDING CAST ABS 3INX4YD NS (CAST SUPPLIES) ×1
PADDING CAST ABS 4INX4YD NS (CAST SUPPLIES) ×1
PADDING CAST ABS COTTON 3X4 (CAST SUPPLIES) IMPLANT
PADDING CAST ABS COTTON 4X4 ST (CAST SUPPLIES) ×1 IMPLANT
PENCIL BUTTON HOLSTER BLD 10FT (ELECTRODE) ×2 IMPLANT
SHEET MEDIUM DRAPE 40X70 STRL (DRAPES) ×2 IMPLANT
SPONGE GAUZE 4X4 12PLY (GAUZE/BANDAGES/DRESSINGS) ×2 IMPLANT
STOCKINETTE 6  STRL (DRAPES) ×1
STOCKINETTE 6 STRL (DRAPES) ×1 IMPLANT
SUCTION FRAZIER TIP 10 FR DISP (SUCTIONS) IMPLANT
SUT BONE WAX W31G (SUTURE) IMPLANT
SUT ETHILON 3 0 PS 1 (SUTURE) IMPLANT
SUT ETHILON 4 0 PS 2 18 (SUTURE) ×1 IMPLANT
SUT ETHILON 5 0 PS 2 18 (SUTURE) IMPLANT
SUT VIC AB 0 CT1 27 (SUTURE)
SUT VIC AB 0 CT1 27XBRD ANBCTR (SUTURE) IMPLANT
SUT VIC AB 0 SH 27 (SUTURE) ×1 IMPLANT
SUT VIC AB 2-0 SH 27 (SUTURE) ×2
SUT VIC AB 2-0 SH 27XBRD (SUTURE) IMPLANT
SUT VIC AB 4-0 P-3 18XBRD (SUTURE) IMPLANT
SUT VIC AB 4-0 P3 18 (SUTURE)
SYR BULB 3OZ (MISCELLANEOUS) ×2 IMPLANT
SYR CONTROL 10ML LL (SYRINGE) ×1 IMPLANT
TOWEL OR 17X24 6PK STRL BLUE (TOWEL DISPOSABLE) ×4 IMPLANT
TOWEL OR NON WOVEN STRL DISP B (DISPOSABLE) ×2 IMPLANT
TUBE CONNECTING 20X1/4 (TUBING) IMPLANT
UNDERPAD 30X30 INCONTINENT (UNDERPADS AND DIAPERS) ×2 IMPLANT

## 2012-08-13 NOTE — Anesthesia Postprocedure Evaluation (Signed)
  Anesthesia Post-op Note  Patient: Dennis [name]  Procedure(s) Performed: Procedure(s): LEFT CHEVRON OSTEOTOMY 2ND HAMMER TOE CORRECTION  EXCISION OF CORN  (Left)  Patient Location: PACU  Anesthesia Type:GA combined with regional for post-op pain  Level of Consciousness: awake, alert , oriented and patient cooperative  Airway and Oxygen Therapy: Patient Spontanous Breathing  Post-op Pain: none  Post-op Assessment: Post-op Vital signs reviewed, Patient's Cardiovascular Status Stable, Respiratory Function Stable, Patent Airway, No signs of Nausea or vomiting and Pain level controlled  Post-op Vital Signs: Reviewed and stable  Complications: No apparent anesthesia complications

## 2012-08-13 NOTE — Op Note (Signed)
PRE-OP DIAGNOSIS:  left foot hallux valgus, second hammertoe, and second toe corn POST-OP DIAGNOSIS:  Same PROCEDURE:  left foot Chevron osteotomy, second PIP resection arthroplasty, and corn excision SURGEON:  Yanette Tripoli MD ASSISTANT:  Bryna Colander PA-C ANESTHESIA:  General and block  Indication for procedure:  Dennis [name] is a 67 y.o. male with a long history of a painful foot deformity.  Having failed non-operative measures of pads and wide shoes surgery was eventually discussed.  With a moderate deformity bunionectomy with Chevron osteotomy was felt to be the best solution. He also had a rigid hammertoe deformity of the second toe and was offered resection arthroplasty there with use of a pin for 4-6 week.  Also had a painful corn on this second toe he wished to have removed.  Informed operative consent was obtained after discussion of risks including reaction to anesthesia, infection, neurovascular injury, and nonunion.  Summary of findings and procedure:  Under above anesthetic a bunionectomy and Chevron osteotomy procedure was performed.  The deformity was corrected and stabilized with a single OrthoSorb pin.  I used fluoroscopy throughout the case and read all views myself.  Bryna Colander assisted throughout and was invaluable to the completion of the case in that he retracted and maintained reduction while I placed pin.  He also closed with me to help minimize OR time. After the [facility] we performed the second hammertoe correction and corn excision.  The resection arthroplasty was stabilized with a single 0.45 k wire out the tip of the toe.  Description of procedure:  The patient was taken to the OR suite where the above anesthetic was applied and then was prepped and draped in normal sterile fashion.  After the administration of Kefzol pre-op antibiotic the leg was elevated, exsanguinated, and a tourniquet inflated about the calf.  A dorsomedial incision was made over the MTP joint with  dissection down to capsule.  A Y shaped capsular incision was made based distally.  The bunion was exposed and resected with an oscillating saw.  I then made a Chevron cut and displaced the distal portion in a lateral direction about 7mm.  A guide pin was then placed for the OrthoSorb pin and position confirmed by fluoro.  I then placed the OrthoSorb pin stabilizing the osteotomy.  The saw was used to remove the resultant medial prominence.  The would was irrigated.  I made a small incision in the first interspace and released the tight capsule on this aspect of the MTP joint.  Capsule was closed with O vicryl and skin was closed with interrupted nylon sutures.  We turned our attention to the second toe.  A horizontal incision was made at the level of the PIP joint and dissection was carried down to the distal portion of the proximal phallynx. The distal several mm were removed with a saw.  The extensor tendon was repaired with vicryl and skin was closed with nylon.  We also made a small incision distally at a corn abutting the first toe on the second toe.  I dissected down to a small bony prominence and removed it with a rongeur. This incision was closed with nylon as well. Tourniquet was deflated and the foot became pick and warm immediately.  Adaptic was applied followed by dry gauze and a loose Ace wrap.  Estimated blood loss and intraoperative fluids can be obtained from anesthesia records.  Disposition:  The patient was scheduled to go home same day and will be contacted by  me tonight.  Follow-up will be in the office in about a week.   Keylor Rands G

## 2012-08-13 NOTE — Transfer of Care (Signed)
Immediate Anesthesia Transfer of Care Note  Patient: Dennis [name]  Procedure(s) Performed: Procedure(s): LEFT CHEVRON OSTEOTOMY 2ND HAMMER TOE CORRECTION  EXCISION OF CORN  (Left)  Patient Location: PACU  Anesthesia Type:GA combined with regional for post-op pain  Level of Consciousness: awake, alert  and oriented  Airway & Oxygen Therapy: Patient Spontanous Breathing and Patient connected to face mask oxygen  Post-op Assessment: Report given to PACU RN and Post -op Vital signs reviewed and stable  Post vital signs: Reviewed and stable  Complications: No apparent anesthesia complications

## 2012-08-13 NOTE — Interval H&P Note (Signed)
History and Physical Interval Note:  08/13/2012 10:25 AM  Dennis [name]  has presented today for surgery, with the diagnosis of LEFT HALLUX VALGUS AND 2ND HAMMER TOE AND CORN  The various methods of treatment have been discussed with the patient and family. After consideration of risks, benefits and other options for treatment, the patient has consented to  Procedure(s): LEFT CHEVRON OSTEOTOMY 2ND HAMMER TOE CORRECTION  EXCISION OF CORN  (Left) as a surgical intervention .  The patient's history has been reviewed, patient examined, no change in status, stable for surgery.  I have reviewed the patient's chart and labs.  Questions were answered to the patient's satisfaction.     [name]

## 2012-08-13 NOTE — Progress Notes (Signed)
Assisted Dr. Jean Rosenthal with left, popliteal block. Side rails up, monitors on throughout procedure. See vital signs in flow sheet. Tolerated Procedure well.

## 2012-08-13 NOTE — Anesthesia Procedure Notes (Addendum)
Anesthesia Regional Block:  Popliteal block  Pre-Anesthetic Checklist: ,, timeout performed, Correct Patient, Correct Site, Correct Laterality, Correct Procedure, Correct Position, site marked, Risks and benefits discussed,  Surgical consent,  Pre-op evaluation,  At surgeon's request and post-op pain management  Laterality: Left     Needles:  Injection technique: Single-shot  Needle Type: Stimulator Needle - 80      Needle Gauge: 22 and 22 G    Additional Needles:  Procedures: nerve stimulator Popliteal block  Nerve Stimulator or Paresthesia:  Response: toe abduction, 0.5 mA, 0.1 ms,  Response: toe dorsiflexion, 0.5 mA, 0.1 ms,   Additional Responses:   Narrative:  Start time: 08/13/2012 10:25 AM End time: 08/13/2012 10:34 AM Injection made incrementally with aspirations every 5 mL.  Performed by: Personally  Anesthesiologist: Sandford Craze, MD  Additional Notes: Pt identified in Holding room.  Monitors applied. Working IV access confirmed. Sterile prep L lateral knee.  #22ga PNS to toe dorsiflexion and toe abduction twitches at 0.76mA threshold.  30cc 0.5% Bupivacaine with 1:200k epi injected incrementally after negative test dose.  Additional prep to L medial anterior aspect of prox tibia, total 10cc 0.5% Bupivacaine with 1:200k epi injected subq for saphenous nerve supplementation. Patient asymptomatic, VSS, no heme aspirated, tolerated well.  Sandford Craze, MD  Popliteal block Procedure Name: LMA Insertion Date/Time: 08/13/2012 10:59 AM Performed by: Zenia Resides D Pre-anesthesia Checklist: Patient identified, Emergency Drugs available, Suction available and Patient being monitored Patient Re-evaluated:Patient Re-evaluated prior to inductionOxygen Delivery Method: Circle System Utilized Preoxygenation: Pre-oxygenation with 100% oxygen Intubation Type: IV induction Ventilation: Mask ventilation without difficulty LMA: LMA inserted LMA Size: 5.0 Number of attempts: 1 Airway  Equipment and Method: bite block Placement Confirmation: positive ETCO2 and breath sounds checked- equal and bilateral Tube secured with: Tape Dental Injury: Teeth and Oropharynx as per pre-operative assessment

## 2012-08-13 NOTE — Anesthesia Preprocedure Evaluation (Signed)
Anesthesia Evaluation  Patient identified by MRN, date of birth, ID band Patient awake    Reviewed: Allergy & Precautions, H&P , NPO status , Patient's Chart, lab work & pertinent test results, reviewed documented beta blocker date and time   History of Anesthesia Complications Negative for: history of anesthetic complications  Airway Mallampati: I TM Distance: >3 FB Neck ROM: Full    Dental  (+) Dental Advisory Given   Pulmonary neg pulmonary ROS,  breath sounds clear to auscultation  Pulmonary exam normal       Cardiovascular hypertension, Pt. on medications and Pt. on home beta blockers +CHF Rhythm:Regular Rate:Normal  2/14 ECHO: EF 40-45%, mild MR   Neuro/Psych CVA, Residual Symptoms    GI/Hepatic negative GI ROS, (+)     substance abuse  alcohol use,   Endo/Other  negative endocrine ROS  Renal/GU negative Renal ROS     Musculoskeletal   Abdominal   Peds  Hematology negative hematology ROS (+)   Anesthesia Other Findings   Reproductive/Obstetrics                           Anesthesia Physical Anesthesia Plan  ASA: III  Anesthesia Plan: General   Post-op Pain Management:    Induction: Intravenous  Airway Management Planned: LMA  Additional Equipment:   Intra-op Plan:   Post-operative Plan:   Informed Consent: I have reviewed the patients History and Physical, chart, labs and discussed the procedure including the risks, benefits and alternatives for the proposed anesthesia with the patient or authorized representative who has indicated his/her understanding and acceptance.   Dental advisory given  Plan Discussed with: Surgeon and CRNA  Anesthesia Plan Comments: (Plan routine monitors, GA- LMA OK, popliteal block for post op analgesia)        Anesthesia Quick Evaluation

## 2012-08-15 ENCOUNTER — Encounter (HOSPITAL_BASED_OUTPATIENT_CLINIC_OR_DEPARTMENT_OTHER): Payer: Self-pay | Admitting: Orthopaedic Surgery

## 2012-11-12 ENCOUNTER — Encounter: Payer: Self-pay | Admitting: Gastroenterology

## 2013-01-01 ENCOUNTER — Telehealth: Payer: Self-pay

## 2013-01-01 NOTE — Telephone Encounter (Signed)
Please schedule an office visit with RK. I am not doc of the day this morning. It is DJ.

## 2013-01-01 NOTE — Telephone Encounter (Signed)
EF 30-35%.  Does he need to be a direct hospital pt or have an office visit?

## 2013-01-16 ENCOUNTER — Encounter: Payer: 59 | Admitting: Gastroenterology

## 2013-01-24 ENCOUNTER — Encounter: Payer: Self-pay | Admitting: Gastroenterology

## 2013-02-26 ENCOUNTER — Ambulatory Visit: Payer: 59 | Admitting: Gastroenterology

## 2013-04-08 ENCOUNTER — Ambulatory Visit: Payer: 59 | Admitting: Gastroenterology

## 2013-05-09 DIAGNOSIS — I119 Hypertensive heart disease without heart failure: Secondary | ICD-10-CM | POA: Insufficient documentation

## 2013-05-09 DIAGNOSIS — E782 Mixed hyperlipidemia: Secondary | ICD-10-CM | POA: Insufficient documentation

## 2013-05-09 DIAGNOSIS — M109 Gout, unspecified: Secondary | ICD-10-CM | POA: Insufficient documentation

## 2013-05-09 DIAGNOSIS — E559 Vitamin D deficiency, unspecified: Secondary | ICD-10-CM | POA: Insufficient documentation

## 2013-05-09 DIAGNOSIS — R7309 Other abnormal glucose: Secondary | ICD-10-CM | POA: Insufficient documentation

## 2013-05-09 DIAGNOSIS — Z8673 Personal history of transient ischemic attack (TIA), and cerebral infarction without residual deficits: Secondary | ICD-10-CM | POA: Insufficient documentation

## 2013-05-12 ENCOUNTER — Encounter: Payer: Self-pay | Admitting: Internal Medicine

## 2013-05-13 ENCOUNTER — Ambulatory Visit: Payer: Self-pay | Admitting: Internal Medicine

## 2013-07-10 ENCOUNTER — Ambulatory Visit: Payer: Self-pay | Admitting: Internal Medicine

## 2013-11-06 ENCOUNTER — Other Ambulatory Visit: Payer: Self-pay | Admitting: Internal Medicine

## 2013-11-18 ENCOUNTER — Encounter: Payer: Self-pay | Admitting: Internal Medicine

## 2013-11-18 DIAGNOSIS — Z79899 Other long term (current) drug therapy: Secondary | ICD-10-CM | POA: Insufficient documentation

## 2013-11-18 DIAGNOSIS — Z0000 Encounter for general adult medical examination without abnormal findings: Secondary | ICD-10-CM

## 2013-11-18 NOTE — Progress Notes (Signed)
Patient ID: Dennis [name], male   DOB: 1946/05/10, 68 y.o.   MRN: [id number]   Stephenie [place]  O     S  H  O  W

## 2013-11-26 ENCOUNTER — Encounter: Payer: Self-pay | Admitting: Internal Medicine

## 2013-12-07 ENCOUNTER — Other Ambulatory Visit: Payer: Self-pay | Admitting: Internal Medicine

## 2013-12-12 ENCOUNTER — Encounter: Payer: Self-pay | Admitting: Internal Medicine

## 2013-12-12 ENCOUNTER — Ambulatory Visit (INDEPENDENT_AMBULATORY_CARE_PROVIDER_SITE_OTHER): Payer: Medicare Other | Admitting: Internal Medicine

## 2013-12-12 VITALS — BP 140/84 | HR 60 | Temp 98.6°F | Resp 16 | Ht 68.0 in | Wt 173.8 lb

## 2013-12-12 DIAGNOSIS — I10 Essential (primary) hypertension: Secondary | ICD-10-CM

## 2013-12-12 DIAGNOSIS — E559 Vitamin D deficiency, unspecified: Secondary | ICD-10-CM

## 2013-12-12 DIAGNOSIS — E782 Mixed hyperlipidemia: Secondary | ICD-10-CM

## 2013-12-12 DIAGNOSIS — R7309 Other abnormal glucose: Secondary | ICD-10-CM | POA: Diagnosis not present

## 2013-12-12 DIAGNOSIS — Z79899 Other long term (current) drug therapy: Secondary | ICD-10-CM | POA: Diagnosis not present

## 2013-12-12 LAB — CBC WITH DIFFERENTIAL/PLATELET
Basophils Absolute: 0 10*3/uL (ref 0.0–0.1)
Basophils Relative: 0 % (ref 0–1)
Eosinophils Absolute: 0.2 10*3/uL (ref 0.0–0.7)
Eosinophils Relative: 2 % (ref 0–5)
HEMATOCRIT: 43.8 % (ref 39.0–52.0)
HEMOGLOBIN: 14.9 g/dL (ref 13.0–17.0)
Lymphocytes Relative: 16 % (ref 12–46)
Lymphs Abs: 1.7 10*3/uL (ref 0.7–4.0)
MCH: 30.7 pg (ref 26.0–34.0)
MCHC: 34 g/dL (ref 30.0–36.0)
MCV: 90.1 fL (ref 78.0–100.0)
MONO ABS: 0.8 10*3/uL (ref 0.1–1.0)
MONOS PCT: 8 % (ref 3–12)
NEUTROS ABS: 7.8 10*3/uL — AB (ref 1.7–7.7)
Neutrophils Relative %: 74 % (ref 43–77)
Platelets: 174 10*3/uL (ref 150–400)
RBC: 4.86 MIL/uL (ref 4.22–5.81)
RDW: 15 % (ref 11.5–15.5)
WBC: 10.5 10*3/uL (ref 4.0–10.5)

## 2013-12-12 LAB — HEMOGLOBIN A1C
Hgb A1c MFr Bld: 6 % — ABNORMAL HIGH (ref <5.7)
Mean Plasma Glucose: 126 mg/dL — ABNORMAL HIGH (ref <117)

## 2013-12-12 MED ORDER — BISOPROLOL-HYDROCHLOROTHIAZIDE 5-6.25 MG PO TABS: 1.0000 | ORAL_TABLET | Freq: Every day | ORAL | Status: DC

## 2013-12-12 MED ORDER — CITALOPRAM HYDROBROMIDE 40 MG PO TABS: 40.0000 mg | ORAL_TABLET | Freq: Every day | ORAL | Status: DC

## 2013-12-12 MED ORDER — LISINOPRIL 10 MG PO TABS: 20.0000 mg | ORAL_TABLET | Freq: Every day | ORAL | Status: DC

## 2013-12-12 NOTE — Patient Instructions (Signed)

## 2013-12-12 NOTE — Progress Notes (Signed)
Patient ID: Dennis [name], male   DOB: Aug 22, 1945, 68 y.o.   MRN: [id number]   This very nice 68 y.o.DBM lost to f/u presents for follow up with Hypertension, Hyperlipidemia, Pre-Diabetes and Vitamin D Deficiency.    Patient is treated for HTN & BP has been controlled at home. Today's BP: 140/84 mmHg. Patient has hx/o CVA in June 2013 manifest with diplopia which subsequently resolved. Patient denies any cardiac type chest pain, palpitations, dyspnea/orthopnea/PND, dizziness, claudication, or dependent edema.   Hyperlipidemia is controlled with diet & meds. Patient denies myalgias or other med SE's. Last Lipids in July 2014 were Chol 115, TG 115, HDL 49 and LDL 51 - all at goal.   Also, the patient has history of PreDiabetes and patient denies any symptoms of reactive hypoglycemia, diabetic polys, paresthesias or visual blurring.  Last A1c was 5.8% in June 2014.    Further, Patient has history of Vitamin D Deficiency of 21 in 2008 and patient supplements vitamin D without any suspected side-effects. Last vitamin D was 77 in June 2014.    Medication List   allopurinol 300 MG tablet  Commonly known as:  ZYLOPRIM  Take 300 mg by mouth daily.     bisoprolol-hydrochlorothiazide 10-6.25 MG per tablet  Commonly known as:  ZIAC  Take 1 tablet by mouth daily.     cholecalciferol 1000 UNITS tablet  Commonly known as:  VITAMIN D  Take 1,000 Units by mouth daily. Takes 5000     citalopram 40 MG tablet  Commonly known as:  CELEXA  take 1 tablet by mouth every morning for MOOD     lisinopril 10 MG tablet  Commonly known as:  PRINIVIL,ZESTRIL  Take 20 mg by mouth daily.     pravastatin 40 MG tablet  Commonly known as:  PRAVACHOL  Take 40 mg by mouth daily.       No Known Allergies PMHx:   Past Medical History  Diagnosis Date  . Hypertension   . Blurry vision, bilateral 10/17/11  . Arthritis     "hands"  . Stress at work   . 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  CHF (congestive heart failure)   . Hyperlipemia   . Illiteracy   . Pre-diabetes   . Gout   . Vitamin D deficiency    FHx:    Reviewed / unchanged SHx:    Reviewed / unchanged  Systems Review:  Constitutional: Denies fever, chills, wt changes, headaches, insomnia, fatigue, night sweats, change in appetite. Eyes: Denies redness, blurred vision, diplopia, discharge, itchy, watery eyes.  ENT: Denies discharge, congestion, post nasal drip, epistaxis, sore throat, earache, hearing loss, dental pain, tinnitus, vertigo, sinus pain, snoring.  CV: Denies chest pain, palpitations, irregular heartbeat, syncope, dyspnea, diaphoresis, orthopnea, PND, claudication or edema. Respiratory: denies cough, dyspnea, DOE, pleurisy, hoarseness, laryngitis, wheezing.  Gastrointestinal: Denies dysphagia, odynophagia, heartburn, reflux, water brash, abdominal pain or cramps, nausea, vomiting, bloating, diarrhea, constipation, hematemesis, melena, hematochezia  or hemorrhoids. Genitourinary: Denies dysuria, frequency, urgency, nocturia, hesitancy, discharge, hematuria or flank pain. Musculoskeletal: Denies arthralgias, myalgias, stiffness, jt. swelling, pain, limping or strain/sprain.  Skin: Denies pruritus, rash, hives, warts, acne, eczema or change in skin lesion(s). Neuro: No weakness, tremor, incoordination, spasms, paresthesia or pain. Psychiatric: Denies confusion, memory loss or sensory loss. Endo: Denies change in weight, skin or hair change.  Heme/Lymph: No excessive bleeding, bruising or enlarged lymph nodes.  Exam:  BP 140/84  Pulse 60  Temp(Src)  98.6 F (37 C) (Temporal)  Resp 16  Ht 5\' 8"  (1.727 m)  Wt 173 lb 12.8 oz (78.835 kg)  BMI 26.43 kg/m2  Appears well nourished and in no distress. Eyes: PERRLA, EOMs, conjunctiva no swelling or erythema. Sinuses: No frontal/maxillary tenderness ENT/Mouth: EAC's clear, TM's nl w/o erythema, bulging. Nares clear w/o erythema, swelling, exudates.  Oropharynx clear without erythema or exudates. Oral hygiene is good. Tongue normal, non obstructing. Hearing intact.  Neck: Supple. Thyroid nl. Car 2+/2+ without bruits, nodes or JVD. Chest: Respirations nl with BS clear & equal w/o rales, rhonchi, wheezing or stridor.  Cor: Heart sounds normal w/ regular rate and rhythm without sig. murmurs, gallops, clicks, or rubs. Peripheral pulses normal and equal  without edema.  Abdomen: Soft & bowel sounds normal. Non-tender w/o guarding, rebound, hernias, masses, or organomegaly.  Lymphatics: Unremarkable.  Musculoskeletal: Full ROM all peripheral extremities, joint stability, 5/5 strength, and normal gait.  Skin: Warm, dry without exposed rashes, lesions or ecchymosis apparent.  Neuro: Cranial nerves intact, reflexes equal bilaterally. Sensory-motor testing grossly intact. Tendon reflexes grossly intact.  Pysch: Alert & oriented x 3. Insight and judgement nl & appropriate. No ideations.  Assessment and Plan:  1. Hypertension - Continue monitor blood pressure at home. Continue diet/meds same.  2. Hyperlipidemia - Continue diet/meds, exercise,& lifestyle modifications. Continue monitor periodic cholesterol/liver & renal functions   3. Pre-Diabetes - Continue diet, exercise, lifestyle modifications. Monitor appropriate labs.   4. Vitamin D Deficiency - Continue supplementation.  Recommended regular exercise, BP monitoring, weight control, and discussed med and SE's. Recommended labs to assess and monitor clinical status. Further disposition pending results of labs.

## 2013-12-13 LAB — BASIC METABOLIC PANEL WITH GFR
BUN: 24 mg/dL — AB (ref 6–23)
CHLORIDE: 103 meq/L (ref 96–112)
CO2: 26 mEq/L (ref 19–32)
Calcium: 9.6 mg/dL (ref 8.4–10.5)
Creat: 1.32 mg/dL (ref 0.50–1.35)
GFR, EST NON AFRICAN AMERICAN: 56 mL/min — AB
GFR, Est African American: 65 mL/min
Glucose, Bld: 114 mg/dL — ABNORMAL HIGH (ref 70–99)
POTASSIUM: 4.4 meq/L (ref 3.5–5.3)
Sodium: 141 mEq/L (ref 135–145)

## 2013-12-13 LAB — HEPATIC FUNCTION PANEL
ALK PHOS: 74 U/L (ref 39–117)
ALT: <8 U/L (ref 0–53)
AST: 14 U/L (ref 0–37)
Albumin: 4.5 g/dL (ref 3.5–5.2)
BILIRUBIN DIRECT: 0.2 mg/dL (ref 0.0–0.3)
BILIRUBIN INDIRECT: 0.6 mg/dL (ref 0.2–1.2)
BILIRUBIN TOTAL: 0.8 mg/dL (ref 0.2–1.2)
TOTAL PROTEIN: 7.2 g/dL (ref 6.0–8.3)

## 2013-12-13 LAB — LIPID PANEL
CHOL/HDL RATIO: 2.5 ratio
Cholesterol: 132 mg/dL (ref 0–200)
HDL: 52 mg/dL (ref >39)
LDL CALC: 51 mg/dL (ref 0–99)
TRIGLYCERIDES: 145 mg/dL (ref <150)
VLDL: 29 mg/dL (ref 0–40)

## 2013-12-13 LAB — INSULIN, FASTING: Insulin fasting, serum: 14 u[IU]/mL (ref 3–28)

## 2013-12-13 LAB — VITAMIN D 25 HYDROXY (VIT D DEFICIENCY, FRACTURES): Vit D, 25-Hydroxy: 51 ng/mL (ref 30–89)

## 2013-12-13 LAB — MAGNESIUM: Magnesium: 2.1 mg/dL (ref 1.5–2.5)

## 2013-12-13 LAB — TSH: TSH: 2.12 u[IU]/mL (ref 0.350–4.500)

## 2013-12-16 ENCOUNTER — Other Ambulatory Visit: Payer: Self-pay | Admitting: *Deleted

## 2013-12-16 MED ORDER — BISOPROLOL-HYDROCHLOROTHIAZIDE 5-6.25 MG PO TABS: 1.0000 | ORAL_TABLET | Freq: Every day | ORAL | Status: DC

## 2013-12-16 MED ORDER — CITALOPRAM HYDROBROMIDE 40 MG PO TABS: 40.0000 mg | ORAL_TABLET | Freq: Every day | ORAL | Status: DC

## 2013-12-16 MED ORDER — LISINOPRIL 10 MG PO TABS: 20.0000 mg | ORAL_TABLET | Freq: Every day | ORAL | Status: DC

## 2014-01-22 ENCOUNTER — Encounter: Payer: Self-pay | Admitting: Internal Medicine

## 2014-02-16 ENCOUNTER — Other Ambulatory Visit: Payer: Self-pay | Admitting: Internal Medicine

## 2014-02-16 MED ORDER — PRAVASTATIN SODIUM 40 MG PO TABS: 40.0000 mg | ORAL_TABLET | Freq: Every day | ORAL | Status: DC

## 2014-03-27 ENCOUNTER — Ambulatory Visit: Payer: Self-pay | Admitting: Physician Assistant

## 2014-04-01 ENCOUNTER — Other Ambulatory Visit: Payer: Self-pay

## 2014-04-01 MED ORDER — ALLOPURINOL 300 MG PO TABS: 300.0000 mg | ORAL_TABLET | Freq: Every day | ORAL | Status: DC

## 2014-04-24 ENCOUNTER — Ambulatory Visit
Admission: RE | Admit: 2014-04-24 | Discharge: 2014-04-24 | Disposition: A | Discharge status: Home / Self Care | Payer: Medicare Other | Source: Ambulatory Visit | Attending: Physician Assistant | Admitting: Physician Assistant

## 2014-04-24 ENCOUNTER — Ambulatory Visit (INDEPENDENT_AMBULATORY_CARE_PROVIDER_SITE_OTHER): Payer: Medicare Other | Admitting: Physician Assistant

## 2014-04-24 ENCOUNTER — Encounter: Payer: Self-pay | Admitting: Physician Assistant

## 2014-04-24 VITALS — BP 132/84 | HR 84 | Temp 98.1°F | Resp 16 | Ht 68.0 in | Wt 152.0 lb

## 2014-04-24 DIAGNOSIS — R6889 Other general symptoms and signs: Secondary | ICD-10-CM | POA: Diagnosis not present

## 2014-04-24 DIAGNOSIS — Z9119 Patient's noncompliance with other medical treatment and regimen: Secondary | ICD-10-CM

## 2014-04-24 DIAGNOSIS — Z23 Encounter for immunization: Secondary | ICD-10-CM

## 2014-04-24 DIAGNOSIS — R410 Disorientation, unspecified: Secondary | ICD-10-CM

## 2014-04-24 DIAGNOSIS — R5383 Other fatigue: Secondary | ICD-10-CM | POA: Diagnosis not present

## 2014-04-24 DIAGNOSIS — E785 Hyperlipidemia, unspecified: Secondary | ICD-10-CM | POA: Diagnosis not present

## 2014-04-24 DIAGNOSIS — R55 Syncope and collapse: Secondary | ICD-10-CM

## 2014-04-24 DIAGNOSIS — I10 Essential (primary) hypertension: Secondary | ICD-10-CM

## 2014-04-24 DIAGNOSIS — Z9181 History of falling: Secondary | ICD-10-CM

## 2014-04-24 DIAGNOSIS — R0600 Dyspnea, unspecified: Secondary | ICD-10-CM

## 2014-04-24 DIAGNOSIS — Z79899 Other long term (current) drug therapy: Secondary | ICD-10-CM

## 2014-04-24 DIAGNOSIS — Z91199 Patient's noncompliance with other medical treatment and regimen due to unspecified reason: Secondary | ICD-10-CM

## 2014-04-24 DIAGNOSIS — R7309 Other abnormal glucose: Secondary | ICD-10-CM | POA: Diagnosis not present

## 2014-04-24 DIAGNOSIS — M20091 Other deformity of right finger(s): Secondary | ICD-10-CM

## 2014-04-24 DIAGNOSIS — M20092 Other deformity of left finger(s): Secondary | ICD-10-CM

## 2014-04-24 DIAGNOSIS — Z0001 Encounter for general adult medical examination with abnormal findings: Secondary | ICD-10-CM

## 2014-04-24 DIAGNOSIS — Z125 Encounter for screening for malignant neoplasm of prostate: Secondary | ICD-10-CM

## 2014-04-24 DIAGNOSIS — I639 Cerebral infarction, unspecified: Secondary | ICD-10-CM | POA: Diagnosis not present

## 2014-04-24 DIAGNOSIS — I272 Pulmonary hypertension, unspecified: Secondary | ICD-10-CM

## 2014-04-24 DIAGNOSIS — Z1331 Encounter for screening for depression: Secondary | ICD-10-CM

## 2014-04-24 DIAGNOSIS — R7303 Prediabetes: Secondary | ICD-10-CM

## 2014-04-24 DIAGNOSIS — S0990XA Unspecified injury of head, initial encounter: Secondary | ICD-10-CM | POA: Diagnosis not present

## 2014-04-24 DIAGNOSIS — E559 Vitamin D deficiency, unspecified: Secondary | ICD-10-CM

## 2014-04-24 DIAGNOSIS — I5022 Chronic systolic (congestive) heart failure: Secondary | ICD-10-CM

## 2014-04-24 DIAGNOSIS — M109 Gout, unspecified: Secondary | ICD-10-CM

## 2014-04-24 LAB — MAGNESIUM: Magnesium: 1.9 mg/dL (ref 1.5–2.5)

## 2014-04-24 LAB — LIPID PANEL
CHOLESTEROL: 114 mg/dL (ref 0–200)
HDL: 45 mg/dL (ref >39)
LDL Cholesterol: 55 mg/dL (ref 0–99)
Total CHOL/HDL Ratio: 2.5 Ratio
Triglycerides: 71 mg/dL (ref <150)
VLDL: 14 mg/dL (ref 0–40)

## 2014-04-24 LAB — CBC WITH DIFFERENTIAL/PLATELET
Basophils Absolute: 0 10*3/uL (ref 0.0–0.1)
Basophils Relative: 0 % (ref 0–1)
EOS PCT: 1 % (ref 0–5)
Eosinophils Absolute: 0.1 10*3/uL (ref 0.0–0.7)
HCT: 40.6 % (ref 39.0–52.0)
HEMOGLOBIN: 13.7 g/dL (ref 13.0–17.0)
LYMPHS ABS: 1.7 10*3/uL (ref 0.7–4.0)
Lymphocytes Relative: 18 % (ref 12–46)
MCH: 30.3 pg (ref 26.0–34.0)
MCHC: 33.7 g/dL (ref 30.0–36.0)
MCV: 89.8 fL (ref 78.0–100.0)
MPV: 11.3 fL (ref 9.4–12.4)
Monocytes Absolute: 0.9 10*3/uL (ref 0.1–1.0)
Monocytes Relative: 9 % (ref 3–12)
Neutro Abs: 7 10*3/uL (ref 1.7–7.7)
Neutrophils Relative %: 72 % (ref 43–77)
PLATELETS: 165 10*3/uL (ref 150–400)
RBC: 4.52 MIL/uL (ref 4.22–5.81)
RDW: 14.3 % (ref 11.5–15.5)
WBC: 9.7 10*3/uL (ref 4.0–10.5)

## 2014-04-24 LAB — BASIC METABOLIC PANEL WITH GFR
BUN: 21 mg/dL (ref 6–23)
CO2: 22 mEq/L (ref 19–32)
CREATININE: 1.32 mg/dL (ref 0.50–1.35)
Calcium: 9.6 mg/dL (ref 8.4–10.5)
Chloride: 102 mEq/L (ref 96–112)
GFR, EST AFRICAN AMERICAN: 65 mL/min
GFR, EST NON AFRICAN AMERICAN: 56 mL/min — AB
Glucose, Bld: 88 mg/dL (ref 70–99)
Potassium: 4.2 mEq/L (ref 3.5–5.3)
Sodium: 138 mEq/L (ref 135–145)

## 2014-04-24 LAB — IRON AND TIBC
%SAT: 32 % (ref 20–55)
Iron: 87 ug/dL (ref 42–165)
TIBC: 270 ug/dL (ref 215–435)
UIBC: 183 ug/dL (ref 125–400)

## 2014-04-24 LAB — TSH: TSH: 1.258 u[IU]/mL (ref 0.350–4.500)

## 2014-04-24 LAB — HEPATIC FUNCTION PANEL
ALBUMIN: 4.3 g/dL (ref 3.5–5.2)
ALT: 14 U/L (ref 0–53)
AST: 17 U/L (ref 0–37)
Alkaline Phosphatase: 86 U/L (ref 39–117)
Bilirubin, Direct: 0.2 mg/dL (ref 0.0–0.3)
Indirect Bilirubin: 0.9 mg/dL (ref 0.2–1.2)
Total Bilirubin: 1.1 mg/dL (ref 0.2–1.2)
Total Protein: 7 g/dL (ref 6.0–8.3)

## 2014-04-24 LAB — RHEUMATOID FACTOR: Rhuematoid fact SerPl-aCnc: <10 IU/mL (ref <=14)

## 2014-04-24 LAB — VITAMIN B12: VITAMIN B 12: 586 pg/mL (ref 211–911)

## 2014-04-24 LAB — FERRITIN: FERRITIN: 348 ng/mL — AB (ref 22–322)

## 2014-04-24 LAB — URIC ACID: Uric Acid, Serum: 4.8 mg/dL (ref 4.0–7.8)

## 2014-04-24 MED ORDER — CARVEDILOL 12.5 MG PO TABS: 12.5000 mg | ORAL_TABLET | Freq: Two times a day (BID) | ORAL | Status: DC
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
## 2014-04-24 NOTE — Patient Instructions (Signed)
Please stop the ziac and start Coreg 12.5mg  BID Cut the Celexa in half for 1 week and then stop it IF you have ANY worsening shortness of breath, chest pain, fainting GO TO ER  Syncope Syncope is a medical term for fainting or passing out. This means you lose consciousness and drop to the ground. People are generally unconscious for less than 5 minutes. You may have some muscle twitches for up to 15 seconds before waking up and returning to normal. Syncope occurs more often in older adults, but it can happen to anyone. While most causes of syncope are not dangerous, syncope can be a sign of a serious medical problem. It is important to seek medical care.  CAUSES  Syncope is caused by a sudden drop in blood flow to the brain. The specific cause is often not determined. Factors that can bring on syncope include:  Taking medicines that lower blood pressure.  Sudden changes in posture, such as standing up quickly.  Taking more medicine than prescribed.  Standing in one place for too long.  Seizure disorders.  Dehydration and excessive exposure to heat.  Low blood sugar (hypoglycemia).  Straining to have a bowel movement.  Heart disease, irregular heartbeat, or other circulatory problems.  Fear, emotional distress, seeing blood, or severe pain. SYMPTOMS  Right before fainting, you may:  Feel dizzy or light-headed.  Feel nauseous.  See all white or all black in your field of vision.  Have cold, clammy skin. DIAGNOSIS  Your health care provider will ask about your symptoms, perform a physical exam, and perform an electrocardiogram (ECG) to record the electrical activity of your heart. Your health care provider may also perform other heart or blood tests to determine the cause of your syncope which may include:  Transthoracic echocardiogram (TTE). During echocardiography, sound waves are used to evaluate how blood flows through your heart.  Transesophageal echocardiogram  (TEE).  Cardiac monitoring. This allows your health care provider to monitor your heart rate and rhythm in real time.  Holter monitor. This is a portable device that records your heartbeat and can help diagnose heart arrhythmias. It allows your health care provider to track your heart activity for several days, if needed.  Stress tests by exercise or by giving medicine that makes the heart beat faster. TREATMENT  In most cases, no treatment is needed. Depending on the cause of your syncope, your health care provider may recommend changing or stopping some of your medicines. HOME CARE INSTRUCTIONS  Have someone stay with you until you feel stable.  Do not drive, use machinery, or play sports until your health care provider says it is okay.  Keep all follow-up appointments as directed by your health care provider.  Lie down right away if you start feeling like you might faint. Breathe deeply and steadily. Wait until all the symptoms have passed.  Drink enough fluids to keep your urine clear or pale yellow.  If you are taking blood pressure or heart medicine, get up slowly and take several minutes to sit and then stand. This can reduce dizziness. SEEK IMMEDIATE MEDICAL CARE IF:   You have a severe headache.  You have unusual pain in the chest, abdomen, or back.  You are bleeding from your mouth or rectum, or you have black or tarry stool.  You have an irregular or very fast heartbeat.  You have pain with breathing.  You have repeated fainting or seizure-like jerking during an episode.  You faint when sitting  or lying down.  You have confusion.  You have trouble walking.  You have severe weakness.  You have vision problems. If you fainted, call your local emergency services (911 in [place] Do not drive yourself to the hospital.  MAKE SURE YOU:  Understand these instructions.  Will watch your condition.  Will get help right away if you are not doing well or get  worse. Document Released: 05/01/2005 Document Revised: 05/06/2013 Document Reviewed: 06/30/2011 [facility] Patient Information 2015 [employer] [place], [place]. This information is not intended to replace advice given to you by your health care provider. Make sure you discuss any questions you have with your health care provider.

## 2014-04-25 LAB — HEMOGLOBIN A1C
HEMOGLOBIN A1C: 5.6 % (ref <5.7)
Mean Plasma Glucose: 114 mg/dL (ref <117)

## 2014-04-25 LAB — BRAIN NATRIURETIC PEPTIDE: BRAIN NATRIURETIC PEPTIDE: 42.4 pg/mL (ref 0.0–100.0)

## 2014-04-25 LAB — HIV ANTIBODY (ROUTINE TESTING W REFLEX): HIV 1&2 Ab, 4th Generation: NONREACTIVE

## 2014-04-25 LAB — D-DIMER, QUANTITATIVE: D-Dimer, Quant: 0.64 ug/mL-FEU — ABNORMAL HIGH (ref 0.00–0.48)

## 2014-04-25 LAB — INSULIN, FASTING: Insulin fasting, serum: 5.4 u[IU]/mL (ref 2.0–19.6)

## 2014-04-25 LAB — VITAMIN D 25 HYDROXY (VIT D DEFICIENCY, FRACTURES): VIT D 25 HYDROXY: 40 ng/mL (ref 30–100)

## 2014-04-25 LAB — RPR

## 2014-04-25 LAB — PSA: PSA: 0.98 ng/mL (ref <=4.00)

## 2014-04-25 LAB — FOLATE RBC: RBC Folate: 596 ng/mL (ref >280)

## 2014-04-25 LAB — SEDIMENTATION RATE: SED RATE: 4 mm/h (ref 0–16)

## 2014-04-27 LAB — ANTI-DNA ANTIBODY, DOUBLE-STRANDED

## 2014-04-27 LAB — ANA: Anti Nuclear Antibody(ANA): NEGATIVE

## 2014-04-27 NOTE — Addendum Note (Signed)
Addended by: Quentin MullingOLLIER, AMANDA R on: 04/27/2014 08:56 AM   Modules accepted: Orders

## 2014-04-28 ENCOUNTER — Encounter: Payer: Self-pay | Admitting: Cardiology

## 2014-04-28 DIAGNOSIS — I420 Dilated cardiomyopathy: Secondary | ICD-10-CM

## 2014-04-28 DIAGNOSIS — I119 Hypertensive heart disease without heart failure: Secondary | ICD-10-CM | POA: Diagnosis not present

## 2014-04-28 DIAGNOSIS — I429 Cardiomyopathy, unspecified: Secondary | ICD-10-CM | POA: Insufficient documentation

## 2014-04-28 DIAGNOSIS — E784 Other hyperlipidemia: Secondary | ICD-10-CM | POA: Diagnosis not present

## 2014-04-28 DIAGNOSIS — R0602 Shortness of breath: Secondary | ICD-10-CM | POA: Diagnosis not present

## 2014-04-28 DIAGNOSIS — R55 Syncope and collapse: Secondary | ICD-10-CM | POA: Diagnosis not present

## 2014-04-28 DIAGNOSIS — Z8673 Personal history of transient ischemic attack (TIA), and cerebral infarction without residual deficits: Secondary | ICD-10-CM | POA: Diagnosis not present
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
## 2014-05-01 ENCOUNTER — Ambulatory Visit
Admission: RE | Admit: 2014-05-01 | Discharge: 2014-05-01 | Disposition: A | Discharge status: Home / Self Care | Payer: Medicare Other | Source: Ambulatory Visit | Attending: Physician Assistant | Admitting: Physician Assistant

## 2014-05-01 ENCOUNTER — Telehealth: Payer: Self-pay | Admitting: Physician Assistant

## 2014-05-01 DIAGNOSIS — I639 Cerebral infarction, unspecified: Secondary | ICD-10-CM

## 2014-05-01 DIAGNOSIS — R0600 Dyspnea, unspecified: Secondary | ICD-10-CM

## 2014-05-01 DIAGNOSIS — I712 Thoracic aortic aneurysm, without rupture: Secondary | ICD-10-CM | POA: Diagnosis not present

## 2014-05-01 DIAGNOSIS — R55 Syncope and collapse: Secondary | ICD-10-CM

## 2014-05-01 DIAGNOSIS — I2510 Atherosclerotic heart disease of native coronary artery without angina pectoris: Secondary | ICD-10-CM | POA: Diagnosis not present

## 2014-05-01 DIAGNOSIS — I272 Pulmonary hypertension, unspecified: Secondary | ICD-10-CM

## 2014-05-01 DIAGNOSIS — J984 Other disorders of lung: Secondary | ICD-10-CM | POA: Diagnosis not present

## 2014-05-01 MED ORDER — IOHEXOL 350 MG/ML SOLN
100.0000 mL | Freq: Once | INTRAVENOUS | Status: AC | PRN
  Administered 2014-05-01: 100 mL via INTRAVENOUS

## 2014-05-01 MED ORDER — DOXYCYCLINE HYCLATE 100 MG PO TABS: 100.0000 mg | ORAL_TABLET | Freq: Two times a day (BID) | ORAL | Status: DC

## 2014-05-01 NOTE — Telephone Encounter (Signed)
CTA results LM at only number available, no PE- + pneumonia, sent in Doxy. Will call on Monday and given full detail report.

## 2014-05-04 ENCOUNTER — Other Ambulatory Visit: Payer: Self-pay | Admitting: Physician Assistant

## 2014-05-04 DIAGNOSIS — R55 Syncope and collapse: Secondary | ICD-10-CM | POA: Diagnosis not present

## 2014-05-04 MED ORDER — AZITHROMYCIN 250 MG PO TABS: ORAL_TABLET | ORAL | Status: DC

## 2014-05-06 ENCOUNTER — Ambulatory Visit (INDEPENDENT_AMBULATORY_CARE_PROVIDER_SITE_OTHER): Payer: Medicare Other | Admitting: Neurology

## 2014-05-06 ENCOUNTER — Telehealth: Payer: Self-pay | Admitting: Neurology

## 2014-05-06 ENCOUNTER — Encounter: Payer: Self-pay | Admitting: Neurology

## 2014-05-06 VITALS — BP 120/80 | HR 87 | Ht 69.0 in | Wt 157.0 lb

## 2014-05-06 DIAGNOSIS — R4182 Altered mental status, unspecified: Secondary | ICD-10-CM | POA: Diagnosis not present

## 2014-05-06 DIAGNOSIS — I639 Cerebral infarction, unspecified: Secondary | ICD-10-CM

## 2014-05-06 NOTE — Patient Instructions (Signed)
1. MRI brain with and without contrast 2. Routine EEG 3. Follow-up after the tests

## 2014-05-06 NOTE — Telephone Encounter (Signed)
Pt resch appt from 06-12-14 to 06-15-14
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
## 2014-05-09 ENCOUNTER — Encounter: Payer: Self-pay | Admitting: Neurology

## 2014-05-12 ENCOUNTER — Encounter (HOSPITAL_COMMUNITY): Payer: Self-pay | Admitting: Cardiology

## 2014-05-12 DIAGNOSIS — I5022 Chronic systolic (congestive) heart failure: Secondary | ICD-10-CM | POA: Diagnosis not present

## 2014-05-12 DIAGNOSIS — R55 Syncope and collapse: Secondary | ICD-10-CM | POA: Diagnosis not present

## 2014-05-12 DIAGNOSIS — I2510 Atherosclerotic heart disease of native coronary artery without angina pectoris: Secondary | ICD-10-CM | POA: Diagnosis not present

## 2014-05-12 DIAGNOSIS — R0602 Shortness of breath: Secondary | ICD-10-CM | POA: Diagnosis not present

## 2014-05-12 DIAGNOSIS — Z01810 Encounter for preprocedural cardiovascular examination: Secondary | ICD-10-CM | POA: Diagnosis not present

## 2014-05-12 DIAGNOSIS — I119 Hypertensive heart disease without heart failure: Secondary | ICD-10-CM | POA: Diagnosis not present

## 2014-05-12 DIAGNOSIS — I420 Dilated cardiomyopathy: Secondary | ICD-10-CM | POA: Diagnosis not present

## 2014-05-12 DIAGNOSIS — E784 Other hyperlipidemia: Secondary | ICD-10-CM | POA: Diagnosis not present

## 2014-05-12 MED ORDER — ASPIRIN 81 MG PO CHEW: 81.0000 mg | CHEWABLE_TABLET | ORAL | Status: AC

## 2014-05-12 MED ORDER — SODIUM CHLORIDE 0.9 % IV SOLN: 250.0000 mL | INTRAVENOUS | Status: DC | PRN

## 2014-05-12 MED ORDER — SODIUM CHLORIDE 0.9 % IJ SOLN: 3.0000 mL | INTRAMUSCULAR | Status: DC | PRN

## 2014-05-12 MED ORDER — SODIUM CHLORIDE 0.9 % IJ SOLN: 3.0000 mL | Freq: Two times a day (BID) | INTRAMUSCULAR | Status: DC

## 2014-05-12 MED ORDER — SODIUM CHLORIDE 0.9 % IV SOLN
INTRAVENOUS | Status: DC
  Administered 2014-05-18: 10:00:00 via INTRAVENOUS
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
## 2014-05-18 ENCOUNTER — Encounter (HOSPITAL_COMMUNITY): Payer: Self-pay | Admitting: Cardiology

## 2014-05-18 ENCOUNTER — Encounter (HOSPITAL_COMMUNITY)
Admission: RE | Disposition: A | Discharge status: Home / Self Care | Payer: Self-pay | Source: Ambulatory Visit | Attending: Cardiology

## 2014-05-18 ENCOUNTER — Ambulatory Visit (HOSPITAL_COMMUNITY)
Admission: RE | Admit: 2014-05-18 | Discharge: 2014-05-18 | Disposition: A | Discharge status: Home / Self Care | Payer: Medicare Other | Source: Ambulatory Visit | Attending: Cardiology | Admitting: Cardiology

## 2014-05-18 DIAGNOSIS — Z79899 Other long term (current) drug therapy: Secondary | ICD-10-CM | POA: Insufficient documentation

## 2014-05-18 DIAGNOSIS — M109 Gout, unspecified: Secondary | ICD-10-CM | POA: Insufficient documentation

## 2014-05-18 DIAGNOSIS — I5022 Chronic systolic (congestive) heart failure: Secondary | ICD-10-CM | POA: Diagnosis not present

## 2014-05-18 DIAGNOSIS — I110 Hypertensive heart disease with heart failure: Secondary | ICD-10-CM | POA: Insufficient documentation

## 2014-05-18 DIAGNOSIS — I2510 Atherosclerotic heart disease of native coronary artery without angina pectoris: Secondary | ICD-10-CM | POA: Diagnosis present

## 2014-05-18 DIAGNOSIS — I420 Dilated cardiomyopathy: Secondary | ICD-10-CM | POA: Diagnosis not present

## 2014-05-18 DIAGNOSIS — Z7982 Long term (current) use of aspirin: Secondary | ICD-10-CM | POA: Insufficient documentation

## 2014-05-18 DIAGNOSIS — Z8673 Personal history of transient ischemic attack (TIA), and cerebral infarction without residual deficits: Secondary | ICD-10-CM | POA: Diagnosis not present

## 2014-05-18 DIAGNOSIS — E785 Hyperlipidemia, unspecified: Secondary | ICD-10-CM | POA: Diagnosis not present

## 2014-05-18 DIAGNOSIS — I429 Cardiomyopathy, unspecified: Secondary | ICD-10-CM

## 2014-05-18 HISTORY — PX: LEFT HEART CATHETERIZATION WITH CORONARY ANGIOGRAM: SHX5451

## 2014-05-18 HISTORY — DX: Hypertensive heart disease without heart failure: I11.9

## 2014-05-18 HISTORY — DX: Cardiomyopathy, unspecified: I42.9

## 2014-05-18 HISTORY — DX: Chronic systolic (congestive) heart failure: I50.22

## 2014-05-18 HISTORY — DX: Atherosclerotic heart disease of native coronary artery without angina pectoris: I25.10

## 2014-05-18 HISTORY — DX: Personal history of transient ischemic attack (TIA), and cerebral infarction without residual deficits: Z86.73

## 2014-05-18 SURGERY — LEFT HEART CATHETERIZATION WITH CORONARY ANGIOGRAM
Anesthesia: LOCAL

## 2014-05-18 MED ORDER — LABETALOL HCL 5 MG/ML IV SOLN
INTRAVENOUS | Status: AC
  Filled 2014-05-18: qty 4

## 2014-05-18 MED ORDER — SODIUM CHLORIDE 0.9 % IV SOLN: 1.0000 mL/kg/h | INTRAVENOUS | Status: DC

## 2014-05-18 MED ORDER — LABETALOL HCL 5 MG/ML IV SOLN
20.0000 mg | Freq: Once | INTRAVENOUS | Status: AC
  Administered 2014-05-18: 20 mg via INTRAVENOUS

## 2014-05-18 MED ORDER — HEPARIN (PORCINE) IN NACL 2-0.9 UNIT/ML-% IJ SOLN
INTRAMUSCULAR | Status: AC
  Filled 2014-05-18: qty 1000

## 2014-05-18 MED ORDER — LABETALOL HCL 5 MG/ML IV SOLN
INTRAVENOUS | Status: AC
Start: 2014-05-18 — End: 2014-05-18
  Filled 2014-05-18: qty 4

## 2014-05-18 MED ORDER — LIDOCAINE HCL (PF) 1 % IJ SOLN
INTRAMUSCULAR | Status: AC
  Filled 2014-05-18: qty 30

## 2014-05-18 NOTE — Interval H&P Note (Signed)
History and Physical Interval Note:  05/18/2014 12:46 PM    Patient seen and examined.  No interval change in history and exam since last note.  Stable for procedure.  Darden Palmer. MD [facility]  05/18/2014

## 2014-05-18 NOTE — Discharge Instructions (Signed)

## 2014-05-18 NOTE — CV Procedure (Addendum)
Cardiac Catheterization Report   Dennis [name]    69 y.o.  male  DOB: 10-Sep-1945  MRN: [id number]  05/18/2014    PROCEDURE:  Left heart catheterization with selective coronary angiography, left ventriculogram.  INDICATIONS:  Cardiomyopathy, undetermined type-assess for coronary artery disease The risks, benefits, and details of the procedure were explained to the patient.  The patient verbalized understanding and wanted to proceed.  Informed written consent was obtained.  PROCEDURE TECHNIQUE:  After Xylocaine anesthesia a 75F sheath was placed in the left femoral artery with a single anterior needle wall stick.   Left coronary angiography was done using a Judkins L4 guide catheter.  Right coronary angiography was done using a Judkins R4 guide catheter.  A 30 cc ventriculogram was performed in the 30 degree RAO projection.  Tolerated the procedure well.  Labetalol 20 mg intravenously was given at the end of the procedure for hypertension.  Sheath removed in the holding area.   CONTRAST:  Total of 65 cc.  COMPLICATIONS:  None.    HEMODYNAMICS:  Aortic postcontrast 170/85, LV postcontrast 170 over 5-21  There was no gradient between the left ventricle and aorta.    ANGIOGRAPHIC DATA:    CORONARY ARTERIES:   Arise and distribute normally.  Right dominant. Mild coronary calcification is noted.  Left main coronary artery: Normal   Left anterior descending: Mild calcification noted proximally with 20-30% narrowing.  The vessel is widely patent.  Circumflex coronary artery: Supplies 2 marginal branches.  It has no significant obstructive coronary disease.  Right coronary artery: Normal.  LEFT VENTRICULOGRAM:  Performed in the 30 RAO projection.  The aortic and mitral valves are normal. The left ventricle is mildly dilated with diffuse hypokinesis.  Estimated ejection fraction is 30%.   IMPRESSIONS:  1. Mild LAD coronary calcification with no  significant obstructive coronary artery disease noted.  Mild stenosis present in proximal LAD 2. Abnormal left ventricular function with elevated LVEDP and diffuse hypokinesis consistent with cardiomyopathy  RECOMMENDATION:  Intensive treatment of blood pressure.  Treat for cardiomyopathy.  Consideration of implantable defibrillator.Darden Palmer MD [facility]

## 2014-05-18 NOTE — Progress Notes (Signed)
Site area: Right groin a 5 french arterial sheath was removed  Site Prior to Removal:  Level 0  Pressure Applied For 20 MINUTES    Minutes Beginning at 1350p  Manual:   Yes.    Patient Status During Pull:  stable  Post Pull Groin Site:  Level 0  Post Pull Instructions Given:  Yes.    Post Pull Pulses Present:  Yes.    Dressing Applied:  Yes.    Comments:  VS remain stable thru sheath pull.  BP 183/92.  Dr Donnie Aho in to see pt and gave a verbal order for Labetalol 20 mg IV given per order.

## 2014-05-20 ENCOUNTER — Telehealth: Payer: Self-pay | Admitting: *Deleted

## 2014-05-20 ENCOUNTER — Other Ambulatory Visit: Payer: Medicare Other

## 2014-05-20 ENCOUNTER — Ambulatory Visit (HOSPITAL_COMMUNITY)
Admission: RE | Admit: 2014-05-20 | Discharge: 2014-05-20 | Disposition: A | Discharge status: Home / Self Care | Payer: Medicare Other | Source: Ambulatory Visit | Attending: Neurology | Admitting: Neurology

## 2014-05-20 ENCOUNTER — Encounter: Payer: Self-pay | Admitting: Family Medicine

## 2014-05-20 DIAGNOSIS — R4182 Altered mental status, unspecified: Secondary | ICD-10-CM

## 2014-05-20 DIAGNOSIS — Z1810 Retained metal fragments, unspecified: Secondary | ICD-10-CM | POA: Insufficient documentation
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
## 2014-05-26 ENCOUNTER — Ambulatory Visit (INDEPENDENT_AMBULATORY_CARE_PROVIDER_SITE_OTHER): Payer: Medicare Other | Admitting: Neurology

## 2014-05-26 DIAGNOSIS — R4182 Altered mental status, unspecified: Secondary | ICD-10-CM

## 2014-05-27 NOTE — Procedures (Signed)
ELECTROENCEPHALOGRAM REPORT  Date of Study: 05/26/2014  Patient's Name: Dennis [name] MRN: [id number] Date of Birth: Dec 14, 1945  Referring Provider: Dr. Patrcia DollyKaren Lile Mccurley  Clinical History: This is a 69 year old man with worsening confusion over the past few months. His girlfriend reports an unwitnessed syncopal episode 3 months ago  Medications: Celexa, aspirin, HCTZ, Lisinopril, Pravastatin  Technical Summary: A multichannel digital EEG recording measured by the international 10-20 system with electrodes applied with paste and impedances below 5000 ohms performed in our laboratory with EKG monitoring in an awake and asleep patient.  Hyperventilation was not performed. Photic stimulation was performed.  The digital EEG was referentially recorded, reformatted, and digitally filtered in a variety of bipolar and referential montages for optimal display.    Description: The patient is awake and asleep during the recording.  During maximal wakefulness, there is a symmetric, medium voltage 10.5 Hz posterior dominant rhythm that attenuates with eye opening.  The record is symmetric.  During drowsiness and sleep, there is an increase in theta slowing of the background.  Vertex waves and symmetric sleep spindles were seen.  Photic stimulation did not elicit any abnormalities.  There were no epileptiform discharges or electrographic seizures seen.    EKG lead showed occasional extrasystolic beats.  Impression: This awake and asleep EEG is normal.    Clinical Correlation: A normal EEG does not exclude a clinical diagnosis of epilepsy.  If further clinical questions remain, prolonged EEG may be helpful.  Clinical correlation is advised.   Patrcia DollyKaren Ronaldo Crilly, M.D.

## 2014-06-12 ENCOUNTER — Ambulatory Visit: Payer: Medicare Other | Admitting: Neurology

## 2014-06-15 ENCOUNTER — Ambulatory Visit: Payer: Medicare Other | Admitting: Neurology

## 2014-06-15 ENCOUNTER — Telehealth: Payer: Self-pay | Admitting: Neurology

## 2014-06-15 NOTE — Telephone Encounter (Signed)
Pt left without being seen today 06-15-14 because they showed up hour early and they resch to March

## 2014-07-01 ENCOUNTER — Ambulatory Visit (INDEPENDENT_AMBULATORY_CARE_PROVIDER_SITE_OTHER): Payer: Medicare Other | Admitting: Internal Medicine

## 2014-07-01 ENCOUNTER — Encounter: Payer: Self-pay | Admitting: Internal Medicine

## 2014-07-01 VITALS — BP 132/88 | HR 64 | Temp 98.8°F | Resp 16 | Ht 68.25 in | Wt 160.2 lb

## 2014-07-01 DIAGNOSIS — Z125 Encounter for screening for malignant neoplasm of prostate: Secondary | ICD-10-CM

## 2014-07-01 DIAGNOSIS — R7303 Prediabetes: Secondary | ICD-10-CM

## 2014-07-01 DIAGNOSIS — R7309 Other abnormal glucose: Secondary | ICD-10-CM | POA: Diagnosis not present

## 2014-07-01 DIAGNOSIS — Z1212 Encounter for screening for malignant neoplasm of rectum: Secondary | ICD-10-CM

## 2014-07-01 DIAGNOSIS — I10 Essential (primary) hypertension: Secondary | ICD-10-CM | POA: Insufficient documentation

## 2014-07-01 DIAGNOSIS — Z1331 Encounter for screening for depression: Secondary | ICD-10-CM

## 2014-07-01 DIAGNOSIS — E785 Hyperlipidemia, unspecified: Secondary | ICD-10-CM

## 2014-07-01 DIAGNOSIS — E559 Vitamin D deficiency, unspecified: Secondary | ICD-10-CM | POA: Diagnosis not present

## 2014-07-01 DIAGNOSIS — Z8673 Personal history of transient ischemic attack (TIA), and cerebral infarction without residual deficits: Secondary | ICD-10-CM

## 2014-07-01 DIAGNOSIS — Z9181 History of falling: Secondary | ICD-10-CM

## 2014-07-01 DIAGNOSIS — F411 Generalized anxiety disorder: Secondary | ICD-10-CM

## 2014-07-01 DIAGNOSIS — Z79899 Other long term (current) drug therapy: Secondary | ICD-10-CM

## 2014-07-01 DIAGNOSIS — M1000 Idiopathic gout, unspecified site: Secondary | ICD-10-CM

## 2014-07-01 LAB — CBC WITH DIFFERENTIAL/PLATELET
BASOS ABS: 0 10*3/uL (ref 0.0–0.1)
Basophils Relative: 0 % (ref 0–1)
Eosinophils Absolute: 0.2 10*3/uL (ref 0.0–0.7)
Eosinophils Relative: 2 % (ref 0–5)
HEMATOCRIT: 42.3 % (ref 39.0–52.0)
HEMOGLOBIN: 13.9 g/dL (ref 13.0–17.0)
Lymphocytes Relative: 24 % (ref 12–46)
Lymphs Abs: 1.8 10*3/uL (ref 0.7–4.0)
MCH: 30.9 pg (ref 26.0–34.0)
MCHC: 32.9 g/dL (ref 30.0–36.0)
MCV: 94 fL (ref 78.0–100.0)
MPV: 10.7 fL (ref 8.6–12.4)
Monocytes Absolute: 0.7 10*3/uL (ref 0.1–1.0)
Monocytes Relative: 9 % (ref 3–12)
Neutro Abs: 4.9 10*3/uL (ref 1.7–7.7)
Neutrophils Relative %: 65 % (ref 43–77)
Platelets: 135 10*3/uL — ABNORMAL LOW (ref 150–400)
RBC: 4.5 MIL/uL (ref 4.22–5.81)
RDW: 14.4 % (ref 11.5–15.5)
WBC: 7.5 10*3/uL (ref 4.0–10.5)

## 2014-07-01 MED ORDER — ALPRAZOLAM 1 MG PO TABS: ORAL_TABLET | ORAL | Status: DC

## 2014-07-01 NOTE — Patient Instructions (Signed)
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
## 2014-07-02 LAB — HEPATIC FUNCTION PANEL
ALT: 13 U/L (ref 0–53)
AST: 18 U/L (ref 0–37)
Albumin: 4.2 g/dL (ref 3.5–5.2)
Alkaline Phosphatase: 79 U/L (ref 39–117)
Bilirubin, Direct: 0.1 mg/dL (ref 0.0–0.3)
Indirect Bilirubin: 0.5 mg/dL (ref 0.2–1.2)
TOTAL PROTEIN: 6.7 g/dL (ref 6.0–8.3)
Total Bilirubin: 0.6 mg/dL (ref 0.2–1.2)

## 2014-07-02 LAB — BASIC METABOLIC PANEL WITH GFR
BUN: 26 mg/dL — ABNORMAL HIGH (ref 6–23)
CHLORIDE: 107 meq/L (ref 96–112)
CO2: 30 mEq/L (ref 19–32)
Calcium: 9.9 mg/dL (ref 8.4–10.5)
Creat: 1.21 mg/dL (ref 0.50–1.35)
GFR, Est African American: 72 mL/min
GFR, Est Non African American: 62 mL/min
Glucose, Bld: 167 mg/dL — ABNORMAL HIGH (ref 70–99)
POTASSIUM: 4.7 meq/L (ref 3.5–5.3)
Sodium: 143 mEq/L (ref 135–145)

## 2014-07-02 LAB — LIPID PANEL
Cholesterol: 128 mg/dL (ref 0–200)
HDL: 51 mg/dL (ref >39)
LDL Cholesterol: 51 mg/dL (ref 0–99)
Total CHOL/HDL Ratio: 2.5 Ratio
Triglycerides: 129 mg/dL (ref <150)
VLDL: 26 mg/dL (ref 0–40)

## 2014-07-02 LAB — VITAMIN D 25 HYDROXY (VIT D DEFICIENCY, FRACTURES): Vit D, 25-Hydroxy: 33 ng/mL (ref 30–100)

## 2014-07-02 LAB — PSA: PSA: 0.94 ng/mL (ref <=4.00)

## 2014-07-02 LAB — INSULIN, FASTING: INSULIN FASTING, SERUM: 37.9 u[IU]/mL — AB (ref 2.0–19.6)

## 2014-07-02 LAB — TSH: TSH: 1.605 u[IU]/mL (ref 0.350–4.500)

## 2014-07-02 LAB — URIC ACID: Uric Acid, Serum: 3.7 mg/dL — ABNORMAL LOW (ref 4.0–7.8)

## 2014-07-02 LAB — MAGNESIUM: Magnesium: 1.9 mg/dL (ref 1.5–2.5)

## 2014-07-02 LAB — HEMOGLOBIN A1C
HEMOGLOBIN A1C: 5.8 % — AB (ref <5.7)
Mean Plasma Glucose: 120 mg/dL — ABNORMAL HIGH (ref <117)

## 2014-07-27 ENCOUNTER — Encounter: Payer: Self-pay | Admitting: Neurology

## 2014-07-27 ENCOUNTER — Ambulatory Visit (INDEPENDENT_AMBULATORY_CARE_PROVIDER_SITE_OTHER): Payer: Medicare Other | Admitting: Neurology

## 2014-07-27 VITALS — BP 132/80 | HR 77 | Resp 16 | Ht 69.0 in | Wt 160.0 lb

## 2014-07-27 DIAGNOSIS — Z8673 Personal history of transient ischemic attack (TIA), and cerebral infarction without residual deficits: Secondary | ICD-10-CM | POA: Diagnosis not present

## 2014-07-27 DIAGNOSIS — R4182 Altered mental status, unspecified: Secondary | ICD-10-CM
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
## 2014-07-27 NOTE — Patient Instructions (Signed)
1. Follow-up in 6 months, call our office for any changes

## 2014-07-29 DIAGNOSIS — R4182 Altered mental status, unspecified: Secondary | ICD-10-CM | POA: Insufficient documentation

## 2014-08-03 ENCOUNTER — Other Ambulatory Visit: Payer: Self-pay | Admitting: *Deleted

## 2014-08-03 ENCOUNTER — Ambulatory Visit (INDEPENDENT_AMBULATORY_CARE_PROVIDER_SITE_OTHER): Payer: Medicare Other | Admitting: Internal Medicine

## 2014-08-03 ENCOUNTER — Encounter: Payer: Self-pay | Admitting: Internal Medicine

## 2014-08-03 VITALS — BP 144/90 | HR 60 | Temp 97.3°F | Resp 16 | Ht 68.25 in | Wt 169.0 lb

## 2014-08-03 DIAGNOSIS — F411 Generalized anxiety disorder: Secondary | ICD-10-CM

## 2014-08-03 DIAGNOSIS — I10 Essential (primary) hypertension: Secondary | ICD-10-CM | POA: Diagnosis not present

## 2014-08-03 DIAGNOSIS — Z1212 Encounter for screening for malignant neoplasm of rectum: Secondary | ICD-10-CM

## 2014-08-03 LAB — POC HEMOCCULT BLD/STL (HOME/3-CARD/SCREEN)
Card #2 Fecal Occult Blod, POC: NEGATIVE
FECAL OCCULT BLD: NEGATIVE
Fecal Occult Blood, POC: NEGATIVE
OCCULT BLOOD DATE: NEGATIVE

## 2014-08-03 MED ORDER — CITALOPRAM HYDROBROMIDE 40 MG PO TABS: ORAL_TABLET | ORAL | Status: DC

## 2014-08-03 NOTE — Patient Instructions (Signed)
  Start Citalopram (Celexa) 40 mg at 1/2 tablet  = 20 mg daily  If still irritable in 1 month may increase to 1 whole pill daily
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
## 2014-10-07 ENCOUNTER — Ambulatory Visit: Payer: Self-pay | Admitting: Physician Assistant

## 2014-10-20 ENCOUNTER — Ambulatory Visit (INDEPENDENT_AMBULATORY_CARE_PROVIDER_SITE_OTHER): Payer: Medicare Other | Admitting: Physician Assistant

## 2014-10-20 ENCOUNTER — Encounter: Payer: Self-pay | Admitting: Physician Assistant

## 2014-10-20 VITALS — BP 158/88 | HR 76 | Temp 97.7°F | Resp 16 | Ht 68.0 in | Wt 160.0 lb

## 2014-10-20 DIAGNOSIS — R7309 Other abnormal glucose: Secondary | ICD-10-CM

## 2014-10-20 DIAGNOSIS — E785 Hyperlipidemia, unspecified: Secondary | ICD-10-CM | POA: Diagnosis not present

## 2014-10-20 DIAGNOSIS — Z79899 Other long term (current) drug therapy: Secondary | ICD-10-CM | POA: Diagnosis not present

## 2014-10-20 DIAGNOSIS — F411 Generalized anxiety disorder: Secondary | ICD-10-CM

## 2014-10-20 DIAGNOSIS — I10 Essential (primary) hypertension: Secondary | ICD-10-CM

## 2014-10-20 DIAGNOSIS — Z8673 Personal history of transient ischemic attack (TIA), and cerebral infarction without residual deficits: Secondary | ICD-10-CM

## 2014-10-20 DIAGNOSIS — E559 Vitamin D deficiency, unspecified: Secondary | ICD-10-CM | POA: Diagnosis not present

## 2014-10-20 DIAGNOSIS — R7303 Prediabetes: Secondary | ICD-10-CM

## 2014-10-20 DIAGNOSIS — R1310 Dysphagia, unspecified: Secondary | ICD-10-CM

## 2014-10-20 DIAGNOSIS — I5022 Chronic systolic (congestive) heart failure: Secondary | ICD-10-CM

## 2014-10-20 LAB — CBC WITH DIFFERENTIAL/PLATELET
Basophils Absolute: 0 10*3/uL (ref 0.0–0.1)
Basophils Relative: 0 % (ref 0–1)
EOS ABS: 0.2 10*3/uL (ref 0.0–0.7)
Eosinophils Relative: 2 % (ref 0–5)
HEMATOCRIT: 43.7 % (ref 39.0–52.0)
Hemoglobin: 15 g/dL (ref 13.0–17.0)
Lymphocytes Relative: 19 % (ref 12–46)
Lymphs Abs: 1.8 10*3/uL (ref 0.7–4.0)
MCH: 31.4 pg (ref 26.0–34.0)
MCHC: 34.3 g/dL (ref 30.0–36.0)
MCV: 91.4 fL (ref 78.0–100.0)
MONO ABS: 0.8 10*3/uL (ref 0.1–1.0)
MPV: 10.1 fL (ref 8.6–12.4)
Monocytes Relative: 9 % (ref 3–12)
NEUTROS PCT: 70 % (ref 43–77)
Neutro Abs: 6.5 10*3/uL (ref 1.7–7.7)
Platelets: 147 10*3/uL — ABNORMAL LOW (ref 150–400)
RBC: 4.78 MIL/uL (ref 4.22–5.81)
RDW: 14 % (ref 11.5–15.5)
WBC: 9.3 10*3/uL (ref 4.0–10.5)

## 2014-10-20 LAB — LIPID PANEL
CHOLESTEROL: 124 mg/dL (ref 0–200)
HDL: 50 mg/dL (ref >=40)
LDL CALC: 61 mg/dL (ref 0–99)
Total CHOL/HDL Ratio: 2.5 Ratio
Triglycerides: 64 mg/dL (ref <150)
VLDL: 13 mg/dL (ref 0–40)

## 2014-10-20 LAB — MAGNESIUM: MAGNESIUM: 2.2 mg/dL (ref 1.5–2.5)

## 2014-10-20 LAB — HEPATIC FUNCTION PANEL
ALBUMIN: 4.2 g/dL (ref 3.5–5.2)
ALT: <8 U/L (ref 0–53)
AST: 14 U/L (ref 0–37)
Alkaline Phosphatase: 67 U/L (ref 39–117)
BILIRUBIN DIRECT: 0.2 mg/dL (ref 0.0–0.3)
Indirect Bilirubin: 0.6 mg/dL (ref 0.2–1.2)
Total Bilirubin: 0.8 mg/dL (ref 0.2–1.2)
Total Protein: 7.4 g/dL (ref 6.0–8.3)

## 2014-10-20 LAB — BASIC METABOLIC PANEL WITH GFR
BUN: 24 mg/dL — ABNORMAL HIGH (ref 6–23)
CO2: 26 meq/L (ref 19–32)
CREATININE: 1.41 mg/dL — AB (ref 0.50–1.35)
Calcium: 10 mg/dL (ref 8.4–10.5)
Chloride: 104 mEq/L (ref 96–112)
GFR, EST AFRICAN AMERICAN: 60 mL/min
GFR, Est Non African American: 52 mL/min — ABNORMAL LOW
GLUCOSE: 85 mg/dL (ref 70–99)
POTASSIUM: 4.6 meq/L (ref 3.5–5.3)
Sodium: 140 mEq/L (ref 135–145)

## 2014-10-20 LAB — HEMOGLOBIN A1C
HEMOGLOBIN A1C: 5.5 % (ref <5.7)
MEAN PLASMA GLUCOSE: 111 mg/dL (ref <117)

## 2014-10-20 LAB — TSH: TSH: 1.062 u[IU]/mL (ref 0.350–4.500)

## 2014-10-20 MED ORDER — RANITIDINE HCL 300 MG PO TABS: ORAL_TABLET | ORAL | Status: DC

## 2014-10-20 MED ORDER — SERTRALINE HCL 100 MG PO TABS: 100.0000 mg | ORAL_TABLET | Freq: Every day | ORAL | Status: DC

## 2014-10-20 MED ORDER — CARVEDILOL 25 MG PO TABS: 25.0000 mg | ORAL_TABLET | Freq: Two times a day (BID) | ORAL | Status: DC

## 2014-10-20 NOTE — Progress Notes (Signed)
Assessment and Plan:  1. Hypertension -Continue medication, monitor blood pressure at home. Continue DASH diet.  Reminder to go to the ER if any CP, SOB, nausea, dizziness, severe HA, changes vision/speech, left arm numbness and tingling and jaw pain.  2. Cholesterol -Continue diet and exercise. Check cholesterol.   3. Prediabetes  -Continue diet and exercise. Check A1C  4. Vitamin D Def - check level and continue medications.   5. Anxiety celexa to zoloft  6. 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 Donnie Aho for holter monitor rule out arrythmia, need for AICD  7. Dysphagia At this time declines testing/referral, with stroke history may need swallow study, will do trial of PPI and if it is not better refer to GI for EGD  Continue diet and meds as discussed. Further disposition pending results of labs. Over 30 minutes of exam, counseling, chart review, and critical decision making was performed  HPI 69 y.o. male  presents for 3 month follow up on hypertension, cholesterol, prediabetes, and vitamin D deficiency.   His blood pressure has been controlled at home, today their BP is BP: (!) 158/88 mmHg  He has history of ASCVD and non ischemic cardiomyopathy with EF 30% via cath, minimal CAD. Weight is stable. He will get some dypsnea and fatigue with sweeping the rug/exertion, better with rest. Denies any more syncopal episodes but states he does feel a fast and occ irregular, non exertional. He is on coreg 12.5 BID, lisinopril , he is on ASA 325.  Wt Readings from Last 3 Encounters:  10/20/14 160 lb (72.576 kg)  08/03/14 169 lb (76.658 kg)  07/27/14 160 lb (72.576 kg)  has some trouble with swallowing breads and meats, no problem with liquids, no globus sensation, never a smoker, denies GERD sx's.  He had a syncopal episode in Dec 2015, and has also had some memory issues,  following with Dr. Karel Jarvis. Had normal EEG and CT shows old CVA.    He does not workout. He denies chest pain, shortness of breath, dizziness.  He is on cholesterol medication and denies myalgias. His cholesterol is at goal. The cholesterol last visit was:   Lab Results  Component Value Date   CHOL 128 07/01/2014   HDL 51 07/01/2014   LDLCALC 51 07/01/2014   TRIG 129 07/01/2014   CHOLHDL 2.5 07/01/2014    He has been working on diet and exercise for prediabetes, and denies paresthesia of the feet, polydipsia, polyuria and visual disturbances. Last A1C in the office was:  Lab Results  Component Value Date   HGBA1C 5.8* 07/01/2014   Patient is on Vitamin D supplement.   Lab Results  Component Value Date   VD25OH 33 07/01/2014      Current Medications:  Current Outpatient Prescriptions on File Prior to Visit  Medication Sig Dispense Refill  . allopurinol (ZYLOPRIM) 300 MG tablet Take 1 tablet (300 mg total) by mouth daily. 90 tablet 3  . ALPRAZolam (XANAX) 1 MG tablet Take 1/2 to 1 tablet 3 x day if needed for anxiety 90 tablet 1  . aspirin 325 MG tablet Take 325 mg by mouth daily.    . carvedilol (COREG) 12.5 MG tablet Take 1 tablet (12.5 mg total) by mouth 2 (two) times daily. 60 tablet 11  . Cholecalciferol (VITAMIN D PO) Take 5,000 Int'l Units by mouth daily.    . citalopram (CELEXA) 40 MG tablet  Take 1/2 to t tablet daily for mood & relaxation 90 tablet 3  . lisinopril (PRINIVIL,ZESTRIL) 10 MG tablet Take 2 tablets (20 mg total) by mouth daily. For BP 90 tablet 1  . pravastatin (PRAVACHOL) 40 MG tablet Take 1 tablet (40 mg total) by mouth at bedtime. For cholesterol 90 tablet 1   No current facility-administered medications on file prior to visit.   Medical History:  Past Medical History  Diagnosis Date  . Hyperlipidemia   . CVA (cerebral vascular accident) June 2013  . Hyperlipemia   . Illiteracy   . Pre-diabetes   . Gout   . Vitamin D deficiency   . History of  ischemic vertebrobasilar artery cerebellar stroke   . Chronic systolic heart failure     Echo 2015 EF 30-35%   . Cardiomyopathy of undetermined type   . Hypertensive heart disease   . CAD (coronary artery disease), native coronary artery     Coronary calcifications noted on CT scan.   . Embedded metal fragments     Metallic Pellet In Heart   Allergies: No Known Allergies   Review of Systems:  Review of Systems  Constitutional: Positive for malaise/fatigue. Negative for fever, chills, weight loss and diaphoresis.  HENT: Negative.   Respiratory: Positive for shortness of breath. Negative for cough, hemoptysis, sputum production and wheezing.   Cardiovascular: Positive for palpitations. Negative for chest pain, orthopnea, claudication, leg swelling and PND.  Gastrointestinal: Negative.   Genitourinary: Negative.   Musculoskeletal: Positive for back pain and joint pain. Negative for myalgias, falls and neck pain.  Skin: Negative.   Neurological: Negative.  Negative for dizziness and weakness.  Psychiatric/Behavioral: Positive for depression. Negative for suicidal ideas, hallucinations, memory loss and substance abuse. The patient is nervous/anxious. The patient does not have insomnia.     Family history- Review and unchanged Social history- Review and unchanged Physical Exam: BP 158/88 mmHg  Pulse 76  Temp(Src) 97.7 F (36.5 C)  Resp 16  Ht 5\' 8"  (1.727 m)  Wt 160 lb (72.576 kg)  BMI 24.33 kg/m2 Wt Readings from Last 3 Encounters:  10/20/14 160 lb (72.576 kg)  08/03/14 169 lb (76.658 kg)  07/27/14 160 lb (72.576 kg)   General Appearance: Well nourished, in no apparent distress. Eyes: PERRLA, EOMs, conjunctiva no swelling or erythema Sinuses: No Frontal/maxillary tenderness ENT/Mouth: Ext aud canals clear, TMs without erythema, bulging. No erythema, swelling, or exudate on post pharynx.  Tonsils not swollen or erythematous. Hearing normal.  Neck: Supple, thyroid normal.   Respiratory: Respiratory effort normal, distant heart sounds, BS equal bilaterally without rales, rhonchi, wheezing or stridor.  Cardio: RRR with 3/6 systolic murmur RSB. Brisk peripheral pulses without edema.  Abdomen: Soft, + BS,  Non tender, no guarding, rebound, hernias, masses. Lymphatics: Non tender without lymphadenopathy.  Musculoskeletal: Full ROM, 5/5 strength, Normal gait Skin: Warm, dry without rashes, lesions, ecchymosis.  Neuro: Cranial nerves intact. Normal muscle tone, no cerebellar symptoms. Psych: Awake and oriented X 3, normal affect, Insight and Judgment appropriate.    Quentin Mullingollier, Shawntel Farnworth, PA-C 10:37 AM [facility]

## 2014-10-20 NOTE — Patient Instructions (Signed)
Will switch celexa to zoloft Will increase coreg to  twice a day, take two of the 12.5 until you run out.  Call Dr. Donnie Aho to get holter monitor.    Do the following things EVERYDAY: 1) Weigh yourself in the morning before breakfast. Write it down and keep it in a log. 2) Take your medicines as prescribed 3) Eat low salt foods-Limit salt (sodium) to 2000 mg per day. Best thing to do is avoid processed foods.   4) Stay as active as you can everyday 5) Limit all fluids for the day to less than 2 liters  Call your doctor if:  Anytime you have any of the following symptoms:  1) 3 pound weight gain in 24 hours or 5 pounds in 1 week  2) shortness of breath, with or without a dry hacking cough  3) swelling in the hands, feet or stomach  4) if you have to sleep on extra pillows at night in order to breathe. 5) after laying down at night for 20-30 mins, you wake up short of breath.   These can all be signs of fluid overload.   Dysphagia Swallowing problems (dysphagia) occur when solids and liquids seem to stick in your throat on the way down to your stomach, or the food takes longer to get to the stomach. Other symptoms include regurgitating food, noises coming from the throat, chest discomfort with swallowing, and a feeling of fullness or the feeling of something being stuck in your throat when swallowing. When blockage in your throat is complete, it may be associated with drooling. CAUSES  Problems with swallowing may occur because of problems with the muscles. The food cannot be propelled in the usual manner into your stomach. You may have ulcers, scar tissue, or inflammation in the tube down which food travels from your mouth to your stomach (esophagus), which blocks food from passing normally into the stomach. Causes of inflammation include:  Acid reflux from your stomach into your esophagus.  Infection.  Radiation treatment for cancer.  Medicines taken without enough fluids to wash  them down into your stomach. You may have nerve problems that prevent signals from being sent to the muscles of your esophagus to contract and move your food down to your stomach. Globus pharyngeus is a relatively common problem in which there is a sense of an obstruction or difficulty in swallowing, without any physical abnormalities of the swallowing passages being found. This problem usually improves over time with reassurance and testing to rule out other causes. DIAGNOSIS Dysphagia can be diagnosed and its cause can be determined by tests in which you swallow a white substance that helps illuminate the inside of your throat (contrast medium) while X-rays are taken. Sometimes a flexible telescope that is inserted down your throat (endoscopy) to look at your esophagus and stomach is used. TREATMENT   If the dysphagia is caused by acid reflux or infection, medicines may be used.  If the dysphagia is caused by problems with your swallowing muscles, swallowing therapy may be used to help you strengthen your swallowing muscles.  If the dysphagia is caused by a blockage or mass, procedures to remove the blockage may be done. HOME CARE INSTRUCTIONS  Try to eat soft food that is easier to swallow and check your weight on a daily basis to be sure that it is not decreasing.  Be sure to drink liquids when sitting upright (not lying down). SEEK MEDICAL CARE IF:  You are losing weight because  you are unable to swallow.  You are coughing when you drink liquids (aspiration).  You are coughing up partially digested food. SEEK IMMEDIATE MEDICAL CARE IF:  You are unable to swallow your own saliva .  You are having shortness of breath or a fever, or both.  You have a hoarse voice along with difficulty swallowing. MAKE SURE YOU:  Understand these instructions.  Will watch your condition.  Will get help right away if you are not doing well or get worse. Document Released: 04/28/2000 Document  Revised: 09/15/2013 Document Reviewed: 10/18/2012 [facility] Patient Information 2015 [employer] [place]. This information is not intended to replace advice given to you by your health care provider. Make sure you discuss any questions you have with your health care provider.

## 2014-10-21 LAB — VITAMIN D 25 HYDROXY (VIT D DEFICIENCY, FRACTURES): Vit D, 25-Hydroxy: 33 ng/mL (ref 30–100)

## 2014-11-24 ENCOUNTER — Ambulatory Visit: Payer: Self-pay

## 2014-11-25 ENCOUNTER — Encounter: Payer: Self-pay | Admitting: Internal Medicine

## 2014-12-07 ENCOUNTER — Ambulatory Visit (INDEPENDENT_AMBULATORY_CARE_PROVIDER_SITE_OTHER): Payer: Medicare Other | Admitting: Internal Medicine

## 2014-12-07 ENCOUNTER — Encounter: Payer: Self-pay | Admitting: Internal Medicine

## 2014-12-07 VITALS — BP 160/88 | HR 60 | Temp 97.2°F | Resp 16 | Ht 68.25 in | Wt 165.0 lb

## 2014-12-07 DIAGNOSIS — R7309 Other abnormal glucose: Secondary | ICD-10-CM | POA: Diagnosis not present

## 2014-12-07 DIAGNOSIS — Z79899 Other long term (current) drug therapy: Secondary | ICD-10-CM

## 2014-12-07 DIAGNOSIS — Z6824 Body mass index (BMI) 24.0-24.9, adult: Secondary | ICD-10-CM

## 2014-12-07 DIAGNOSIS — N289 Disorder of kidney and ureter, unspecified: Secondary | ICD-10-CM

## 2014-12-07 DIAGNOSIS — I10 Essential (primary) hypertension: Secondary | ICD-10-CM

## 2014-12-07 DIAGNOSIS — R7303 Prediabetes: Secondary | ICD-10-CM

## 2014-12-07 DIAGNOSIS — Z0000 Encounter for general adult medical examination without abnormal findings: Secondary | ICD-10-CM

## 2014-12-07 MED ORDER — AMLODIPINE BESYLATE 10 MG PO TABS: ORAL_TABLET | ORAL | Status: DC

## 2014-12-07 NOTE — Patient Instructions (Signed)
Chronic Kidney Disease Chronic kidney disease occurs when the kidneys are damaged over a long period. The kidneys are two organs that lie on either side of the spine between the middle of the back and the front of the abdomen. The kidneys:   Remove wastes and extra water from the blood.   Produce important hormones. These help keep bones strong, regulate blood pressure, and help create red blood cells.   Balance the fluids and chemicals in the blood and tissues. A small amount of kidney damage may not cause problems, but a large amount of damage may make it difficult or impossible for the kidneys to work the way they should. If steps are not taken to slow down the kidney damage or stop it from getting worse, the kidneys may stop working permanently. Most of the time, chronic kidney disease does not go away. However, it can often be controlled, and those with the disease can usually live normal lives. CAUSES  The most common causes of chronic kidney disease are diabetes and high blood pressure (hypertension). Chronic kidney disease may also be caused by:   Diseases that cause the kidneys' filters to become inflamed.   Diseases that affect the immune system.   Genetic diseases.   Medicines that damage the kidneys, such as anti-inflammatory medicines.  Poisoning or exposure to toxic substances.   A reoccurring kidney or urinary infection.   A problem with urine flow. This may be caused by:   Cancer.   Kidney stones.   An enlarged prostate in males. SIGNS AND SYMPTOMS  Because the kidney damage in chronic kidney disease occurs slowly, symptoms develop slowly and may not be obvious until the kidney damage becomes severe. A person may have a kidney disease for years without showing any symptoms. Symptoms can include:   Swelling (edema) of the legs, ankles, or feet.   Tiredness (lethargy).   Nausea or vomiting.   Confusion.   Problems with urination, such as:    Decreased urine production.   Frequent urination, especially at night.   Frequent accidents in children who are potty trained.   Muscle twitches and cramps.   Shortness of breath.  Weakness.   Persistent itchiness.   Loss of appetite.  Metallic taste in the mouth.  Trouble sleeping.  Slowed development in children.  Short stature in children. DIAGNOSIS  Chronic kidney disease may be detected and diagnosed by tests, including blood, urine, imaging, or kidney biopsy tests.  TREATMENT  Most chronic kidney diseases cannot be cured. Treatment usually involves relieving symptoms and preventing or slowing the progression of the disease. Treatment may include:   A special diet. You may need to avoid alcohol and foods thatare salty and high in potassium.   Medicines. These may:   Lower blood pressure.   Relieve anemia.   Relieve swelling.   Protect the bones. HOME CARE INSTRUCTIONS   Follow your prescribed diet.   Take medicines only as directed by your health care provider. Do not take any new medicines (prescription, over-the-counter, or nutritional supplements) unless approved by your health care provider. Many medicines can worsen your kidney damage or need to have the dose adjusted.   Quit smoking if you smoke. Talk to your health care provider about a smoking cessation program.   Keep all follow-up visits as directed by your health care provider. SEEK IMMEDIATE MEDICAL CARE IF:  Your symptoms get worse or you develop new symptoms.   You develop symptoms of end-stage kidney disease. These  include:   Headaches.   Abnormally dark or light skin.   Numbness in the hands or feet.   Easy bruising.   Frequent hiccups.   Menstruation stops.   You have a fever.   You have decreased urine production.   You havepain or bleeding when urinating. MAKE SURE YOU:  Understand these instructions.  Will watch your condition.  Will  get help right away if you are not doing well or get worse. FOR MORE INFORMATION   American Association of Kidney Patients: [employer]idential[employer].is  National Kidney Foundation: www.kidney.org
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
## 2014-12-08 LAB — BASIC METABOLIC PANEL
BUN: 18 mg/dL (ref 7–25)
CHLORIDE: 104 mmol/L (ref 98–110)
CO2: 24 mmol/L (ref 20–31)
CREATININE: 1.5 mg/dL — AB (ref 0.70–1.25)
Calcium: 9.8 mg/dL (ref 8.6–10.3)
Glucose, Bld: 84 mg/dL (ref 65–99)
Potassium: 4.7 mmol/L (ref 3.5–5.3)
Sodium: 142 mmol/L (ref 135–146)

## 2014-12-25 ENCOUNTER — Other Ambulatory Visit: Payer: Self-pay | Admitting: Internal Medicine

## 2014-12-29 ENCOUNTER — Ambulatory Visit (INDEPENDENT_AMBULATORY_CARE_PROVIDER_SITE_OTHER): Payer: Medicare Other | Admitting: Podiatry

## 2014-12-29 ENCOUNTER — Encounter: Payer: Self-pay | Admitting: Podiatry

## 2014-12-29 VITALS — BP 142/68 | HR 69 | Resp 12

## 2014-12-29 DIAGNOSIS — B351 Tinea unguium: Secondary | ICD-10-CM | POA: Diagnosis not present

## 2014-12-29 NOTE — Patient Instructions (Signed)
Diabetes and Foot Care Diabetes may cause you to have problems because of poor blood supply (circulation) to your feet and legs. This may cause the skin on your feet to become thinner, break easier, and heal more slowly. Your skin may become dry, and the skin may peel and crack. You may also have nerve damage in your legs and feet causing decreased feeling in them. You may not notice minor injuries to your feet that could lead to infections or more serious problems. Taking care of your feet is one of the most important things you can do for yourself.  HOME CARE INSTRUCTIONS  Wear shoes at all times, even in the house. Do not go barefoot. Bare feet are easily injured.  Check your feet daily for blisters, cuts, and redness. If you cannot see the bottom of your feet, use a mirror or ask someone for help.  Wash your feet with warm water (do not use hot water) and mild soap. Then pat your feet and the areas between your toes until they are completely dry. Do not soak your feet as this can dry your skin.  Apply a moisturizing lotion or petroleum jelly (that does not contain alcohol and is unscented) to the skin on your feet and to dry, brittle toenails. Do not apply lotion between your toes.  Trim your toenails straight across. Do not dig under them or around the cuticle. File the edges of your nails with an emery board or nail file.  Do not cut corns or calluses or try to remove them with medicine.  Wear clean socks or stockings every day. Make sure they are not too tight. Do not wear knee-high stockings since they may decrease blood flow to your legs.  Wear shoes that fit properly and have enough cushioning. To break in new shoes, wear them for just a few hours a day. This prevents you from injuring your feet. Always look in your shoes before you put them on to be sure there are no objects inside.  Do not cross your legs. This may decrease the blood flow to your feet.  If you find a minor scrape,  cut, or break in the skin on your feet, keep it and the skin around it clean and dry. These areas may be cleansed with mild soap and water. Do not cleanse the area with peroxide, alcohol, or iodine.  When you remove an adhesive bandage, be sure not to damage the skin around it.  If you have a wound, look at it several times a day to make sure it is healing.  Do not use heating pads or hot water bottles. They may burn your skin. If you have lost feeling in your feet or legs, you may not know it is happening until it is too late.  Make sure your health care provider performs a complete foot exam at least annually or more often if you have foot problems. Report any cuts, sores, or bruises to your health care provider immediately. SEEK MEDICAL CARE IF:   You have an injury that is not healing.  You have cuts or breaks in the skin.  You have an ingrown nail.  You notice redness on your legs or feet.  You feel burning or tingling in your legs or feet.  You have pain or cramps in your legs and feet.  Your legs or feet are numb.  Your feet always feel cold. SEEK IMMEDIATE MEDICAL CARE IF:   There is increasing redness,   swelling, or pain in or around a wound.  There is a red line that goes up your leg.  Pus is coming from a wound.  You develop a fever or as directed by your health care provider.  You notice a bad smell coming from an ulcer or wound. Document Released: 04/28/2000 Document Revised: 01/01/2013 Document Reviewed: 10/08/2012 ExitCare Patient Information 2015 [employer]. This information is not intended to replace advice given to you by your health care provider. Make sure you discuss any questions you have with your health care provider.  
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
## 2015-01-04 ENCOUNTER — Telehealth: Payer: Self-pay | Admitting: Neurology

## 2015-01-04 ENCOUNTER — Encounter: Payer: Self-pay | Admitting: Neurology

## 2015-01-04 ENCOUNTER — Ambulatory Visit (INDEPENDENT_AMBULATORY_CARE_PROVIDER_SITE_OTHER): Payer: Medicare Other | Admitting: Neurology

## 2015-01-04 VITALS — BP 126/72 | HR 63 | Resp 16 | Ht 69.0 in | Wt 160.0 lb

## 2015-01-04 DIAGNOSIS — R413 Other amnesia: Secondary | ICD-10-CM | POA: Insufficient documentation

## 2015-01-04 DIAGNOSIS — Z8673 Personal history of transient ischemic attack (TIA), and cerebral infarction without residual deficits: Secondary | ICD-10-CM | POA: Diagnosis not present

## 2015-01-04 DIAGNOSIS — F411 Generalized anxiety disorder: Secondary | ICD-10-CM | POA: Diagnosis not present

## 2015-01-04 NOTE — Telephone Encounter (Signed)
Pt's daughter Gillermina Hu called and wanted to get a letter stating that her father was not capable of living on his own/Dawn CB# [phone]
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
## 2015-01-04 NOTE — Patient Instructions (Signed)
1. Continue all your current medications 2. Discuss anxiety with primary care doctor 3. Recommend 24/7 supervision and care by family to monitor medications 4. Follow-up in 6 months

## 2015-01-04 NOTE — Telephone Encounter (Signed)
Please review

## 2015-01-06 ENCOUNTER — Encounter: Payer: Self-pay | Admitting: Neurology

## 2015-01-06 NOTE — Telephone Encounter (Signed)
Called her to let her know that letter was ready. She requested that letter be mailed to her @ [address]. [place] [address]. Letter mailed.

## 2015-01-06 NOTE — Telephone Encounter (Signed)
pls let her know letter is done. Thanks

## 2015-01-11 ENCOUNTER — Other Ambulatory Visit: Payer: Self-pay | Admitting: *Deleted

## 2015-01-11 ENCOUNTER — Ambulatory Visit: Payer: Self-pay | Admitting: Internal Medicine

## 2015-01-11 ENCOUNTER — Ambulatory Visit (INDEPENDENT_AMBULATORY_CARE_PROVIDER_SITE_OTHER): Payer: Medicare Other | Admitting: Internal Medicine

## 2015-01-11 ENCOUNTER — Encounter: Payer: Self-pay | Admitting: Internal Medicine

## 2015-01-11 VITALS — BP 116/80 | HR 56 | Temp 97.5°F | Resp 16 | Ht 68.25 in | Wt 160.0 lb

## 2015-01-11 DIAGNOSIS — F411 Generalized anxiety disorder: Secondary | ICD-10-CM

## 2015-01-11 DIAGNOSIS — E559 Vitamin D deficiency, unspecified: Secondary | ICD-10-CM

## 2015-01-11 DIAGNOSIS — Z789 Other specified health status: Secondary | ICD-10-CM

## 2015-01-11 DIAGNOSIS — R7309 Other abnormal glucose: Secondary | ICD-10-CM | POA: Diagnosis not present

## 2015-01-11 DIAGNOSIS — I10 Essential (primary) hypertension: Secondary | ICD-10-CM

## 2015-01-11 DIAGNOSIS — Z79899 Other long term (current) drug therapy: Secondary | ICD-10-CM

## 2015-01-11 DIAGNOSIS — E785 Hyperlipidemia, unspecified: Secondary | ICD-10-CM | POA: Diagnosis not present

## 2015-01-11 DIAGNOSIS — Z9119 Patient's noncompliance with other medical treatment and regimen: Secondary | ICD-10-CM

## 2015-01-11 DIAGNOSIS — R7303 Prediabetes: Secondary | ICD-10-CM

## 2015-01-11 DIAGNOSIS — Z91199 Patient's noncompliance with other medical treatment and regimen due to unspecified reason: Secondary | ICD-10-CM

## 2015-01-11 LAB — HEPATIC FUNCTION PANEL
ALT: 10 U/L (ref 9–46)
AST: 17 U/L (ref 10–35)
Albumin: 4.1 g/dL (ref 3.6–5.1)
Alkaline Phosphatase: 60 U/L (ref 40–115)
BILIRUBIN DIRECT: 0.1 mg/dL (ref <=0.2)
BILIRUBIN TOTAL: 0.5 mg/dL (ref 0.2–1.2)
Indirect Bilirubin: 0.4 mg/dL (ref 0.2–1.2)
Total Protein: 6.7 g/dL (ref 6.1–8.1)

## 2015-01-11 LAB — BASIC METABOLIC PANEL WITH GFR
BUN: 22 mg/dL (ref 7–25)
CHLORIDE: 101 mmol/L (ref 98–110)
CO2: 27 mmol/L (ref 20–31)
CREATININE: 1.45 mg/dL — AB (ref 0.70–1.25)
Calcium: 9.5 mg/dL (ref 8.6–10.3)
GFR, EST NON AFRICAN AMERICAN: 50 mL/min — AB (ref >=60)
GFR, Est African American: 58 mL/min — ABNORMAL LOW (ref >=60)
Glucose, Bld: 95 mg/dL (ref 65–99)
Potassium: 4.7 mmol/L (ref 3.5–5.3)
SODIUM: 140 mmol/L (ref 135–146)

## 2015-01-11 LAB — HEMOGLOBIN A1C
HEMOGLOBIN A1C: 5.7 % — AB (ref <5.7)
Mean Plasma Glucose: 117 mg/dL — ABNORMAL HIGH (ref <117)

## 2015-01-11 LAB — CBC WITH DIFFERENTIAL/PLATELET
BASOS ABS: 0 10*3/uL (ref 0.0–0.1)
BASOS PCT: 0 % (ref 0–1)
EOS ABS: 0.3 10*3/uL (ref 0.0–0.7)
Eosinophils Relative: 3 % (ref 0–5)
HCT: 39.8 % (ref 39.0–52.0)
Hemoglobin: 13.8 g/dL (ref 13.0–17.0)
Lymphocytes Relative: 18 % (ref 12–46)
Lymphs Abs: 1.5 10*3/uL (ref 0.7–4.0)
MCH: 31.8 pg (ref 26.0–34.0)
MCHC: 34.7 g/dL (ref 30.0–36.0)
MCV: 91.7 fL (ref 78.0–100.0)
MONOS PCT: 10 % (ref 3–12)
MPV: 9.9 fL (ref 8.6–12.4)
Monocytes Absolute: 0.9 10*3/uL (ref 0.1–1.0)
NEUTROS PCT: 69 % (ref 43–77)
Neutro Abs: 5.9 10*3/uL (ref 1.7–7.7)
PLATELETS: 144 10*3/uL — AB (ref 150–400)
RBC: 4.34 MIL/uL (ref 4.22–5.81)
RDW: 14.5 % (ref 11.5–15.5)
WBC: 8.5 10*3/uL (ref 4.0–10.5)

## 2015-01-11 LAB — LIPID PANEL
CHOL/HDL RATIO: 2.2 ratio (ref <=5.0)
CHOLESTEROL: 118 mg/dL — AB (ref 125–200)
HDL: 53 mg/dL (ref >=40)
LDL Cholesterol: 43 mg/dL (ref <130)
Triglycerides: 109 mg/dL (ref <150)
VLDL: 22 mg/dL (ref <30)

## 2015-01-11 LAB — TSH: TSH: 0.868 u[IU]/mL (ref 0.350–4.500)

## 2015-01-11 LAB — MAGNESIUM: MAGNESIUM: 2 mg/dL (ref 1.5–2.5)

## 2015-01-11 MED ORDER — FUROSEMIDE 40 MG PO TABS: 40.0000 mg | ORAL_TABLET | Freq: Every day | ORAL | Status: DC | PRN

## 2015-01-11 MED ORDER — ALPRAZOLAM 1 MG PO TABS: ORAL_TABLET | ORAL | Status: DC

## 2015-01-11 MED ORDER — LISINOPRIL 10 MG PO TABS: 20.0000 mg | ORAL_TABLET | Freq: Every day | ORAL | Status: DC

## 2015-01-11 MED ORDER — BUPROPION HCL ER (XL) 150 MG PO TB24: 150.0000 mg | ORAL_TABLET | ORAL | Status: DC

## 2015-01-11 MED ORDER — SERTRALINE HCL 100 MG PO TABS: 100.0000 mg | ORAL_TABLET | Freq: Every day | ORAL | Status: DC

## 2015-01-11 NOTE — Progress Notes (Signed)
Patient ID: Dennis [name], male   DOB: 06-Sep-1945, 69 y.o.   MRN: [id number]  Assessment and Plan:  Hypertension:  -Continue medication,  -monitor blood pressure at home.  -Continue DASH diet.   -Reminder to go to the ER if any CP, SOB, nausea, dizziness, severe HA, changes vision/speech, left arm numbness and tingling, and jaw pain.  Cholesterol: -Continue diet and exercise.  -Check cholesterol.   Pre-diabetes: -Continue diet and exercise.  -Check A1C  Vitamin D Def: -check level -continue medications.   Decreased ADLs -OT/PT needed due to poor skills  Anxiety -add in wellbutrin to zoloft -cont xanax -recheck in 1 month -call for any interaction  Peripheral edema -lasix prn if failed therapy with conservative treatments  Continue diet and meds as discussed. Further disposition pending results of labs.  HPI 69 y.o. male  presents for 3 month follow up with hypertension, hyperlipidemia, prediabetes and vitamin D.   His blood pressure has been controlled at home, today their BP is BP: 116/80 mmHg.   He does workout. He denies chest pain, shortness of breath, dizziness.   He is on cholesterol medication and denies myalgias. His cholesterol is at goal. The cholesterol last visit was:   Lab Results  Component Value Date   CHOL 124 10/20/2014   HDL 50 10/20/2014   LDLCALC 61 10/20/2014   TRIG 64 10/20/2014   CHOLHDL 2.5 10/20/2014     He has been working on diet and exercise for prediabetes, and denies foot ulcerations, hyperglycemia, hypoglycemia , increased appetite, nausea, paresthesia of the feet, polydipsia, polyuria, visual disturbances, vomiting and weight loss. Last A1C in the office was:  Lab Results  Component Value Date   HGBA1C 5.5 10/20/2014    Patient is on Vitamin D supplement.  Lab Results  Component Value Date   VD25OH 52 10/20/2014     He has been having a lot of anxiety recently.  His sister in law reports a significant amount of memory loss  lately and that he is forgetting how to do a lot of daily activities.  She reports that he has been having a lot of issues with remembering hot and cold water and is afraid of normal everyday things.  She reports that since the stroke he does not do a whole lot other than sit there and occasionally walk.     Current Medications:  Current Outpatient Prescriptions on File Prior to Visit  Medication Sig Dispense Refill  . ALPRAZolam (XANAX) 1 MG tablet Take 1/2 to 1 tablet 3 x day if needed for anxiety (Patient taking differently: 1 mg 2 (two) times daily as needed. ) 90 tablet 1  . amLODipine (NORVASC) 10 MG tablet Take 1/2 to 1 tablet daily for BP (Patient taking differently: Take 1/2 tablet daily) 30 tablet 11  . aspirin 325 MG tablet Take 325 mg by mouth daily.    . carvedilol (COREG) 25 MG tablet Take 1 tablet (25 mg total) by mouth 2 (two) times daily. 60 tablet 5  . Cholecalciferol (VITAMIN D PO) Take 5,000 Int'l Units by mouth daily.    . pravastatin (PRAVACHOL) 40 MG tablet Take 1 tablet (40 mg total) by mouth at bedtime. For cholesterol 90 tablet 1  . ranitidine (ZANTAC) 300 MG tablet Take twice a day for 2 weeks, then can go to once at night (Patient taking differently: No sig reported) 60 tablet 3   No current facility-administered medications on file prior to visit.    Medical History:  Past Medical History  Diagnosis Date  . Hyperlipidemia   . CVA (cerebral vascular accident) June 2013  . Hyperlipemia   . Illiteracy   . Pre-diabetes   . Gout   . Vitamin D deficiency   . History of ischemic vertebrobasilar artery cerebellar stroke   . Chronic systolic heart failure     Echo 2015 EF 30-35%   . Cardiomyopathy of undetermined type   . Hypertensive heart disease   . CAD (coronary artery disease), native coronary artery     Coronary calcifications noted on CT scan.   . Embedded metal fragments     Metallic Pellet In Heart    Allergies: No Known Allergies   Review of  Systems:  Review of Systems  Constitutional: Negative for fever, chills and malaise/fatigue.  HENT: Negative for congestion, ear pain and sore throat.   Eyes: Negative.   Respiratory: Negative for cough, shortness of breath and wheezing.   Cardiovascular: Positive for leg swelling. Negative for chest pain and palpitations.  Gastrointestinal: Negative for diarrhea, constipation, blood in stool and melena.  Genitourinary: Negative.   Skin: Negative.   Neurological: Negative for dizziness, loss of consciousness and headaches.  Psychiatric/Behavioral: Negative for depression. The patient is nervous/anxious. The patient does not have insomnia.     Family history- Review and unchanged  Social history- Review and unchanged  Physical Exam: BP 116/80 mmHg  Pulse 56  Temp(Src) 97.5 F (36.4 C)  Resp 16  Ht 5' 8.25" (1.734 m)  Wt 160 lb (72.576 kg)  BMI 24.14 kg/m2 Wt Readings from Last 3 Encounters:  01/11/15 160 lb (72.576 kg)  01/04/15 160 lb (72.576 kg)  12/07/14 165 lb (74.844 kg)    General Appearance: Well nourished well developed, in no apparent distress. Eyes: PERRLA, EOMs, conjunctiva no swelling or erythema ENT/Mouth: Ear canals normal without obstruction, swelling, erythma, discharge.  TMs normal bilaterally.  Oropharynx moist, clear, without exudate, or postoropharyngeal swelling. Neck: Supple, thyroid normal,no cervical adenopathy  Respiratory: Respiratory effort normal, Breath sounds clear A&P without rhonchi, wheeze, or rale.  No retractions, no accessory usage. Cardio: RRR with no MRGs. Brisk peripheral pulses without edema.  Abdomen: Soft, + BS,  Non tender, no guarding, rebound, hernias, masses. Musculoskeletal: Full ROM, 5/5 strength, Normal gait Skin: Warm, dry without rashes, lesions, ecchymosis.  Neuro: Awake and oriented X 3, Cranial nerves intact. Normal muscle tone, no cerebellar symptoms. Psych: Normal affect, Poor Insight and Judgment, does not always  appear to comprehend conversation   [name], PA-C 11:15 AM [facility]

## 2015-01-11 NOTE — Patient Instructions (Addendum)
Please start taking wellbutrin once daily to help with anxiety.  If you have any difficulties or side effects from the medication please call the office and let us know.  Please start taking fluid pill as needed to help with leg swelling and shortness of breath.  Try elevating legs above heart, avoiding salt and compression socks first and if this doesn't work please take one pill once a day.  Please continue to take xanax prn.  We will work on setting up occupational therapy.    [facility]  [phone]

## 2015-01-12 LAB — VITAMIN D 25 HYDROXY (VIT D DEFICIENCY, FRACTURES): Vit D, 25-Hydroxy: 46 ng/mL (ref 30–100)

## 2015-01-12 LAB — INSULIN, RANDOM: Insulin: 8 u[IU]/mL (ref 2.0–19.6)

## 2015-01-13 DIAGNOSIS — R2689 Other abnormalities of gait and mobility: Secondary | ICD-10-CM | POA: Diagnosis not present

## 2015-01-13 DIAGNOSIS — I2510 Atherosclerotic heart disease of native coronary artery without angina pectoris: Secondary | ICD-10-CM | POA: Diagnosis not present

## 2015-01-13 DIAGNOSIS — R739 Hyperglycemia, unspecified: Secondary | ICD-10-CM | POA: Diagnosis not present

## 2015-01-13 DIAGNOSIS — I6931 Cognitive deficits following cerebral infarction: Secondary | ICD-10-CM | POA: Diagnosis not present

## 2015-01-13 DIAGNOSIS — I5022 Chronic systolic (congestive) heart failure: Secondary | ICD-10-CM | POA: Diagnosis not present

## 2015-01-13 DIAGNOSIS — I10 Essential (primary) hypertension: Secondary | ICD-10-CM | POA: Diagnosis not present

## 2015-01-13 DIAGNOSIS — Z9181 History of falling: Secondary | ICD-10-CM | POA: Diagnosis not present

## 2015-01-13 DIAGNOSIS — Z96641 Presence of right artificial hip joint: Secondary | ICD-10-CM | POA: Diagnosis not present

## 2015-01-13 DIAGNOSIS — F419 Anxiety disorder, unspecified: Secondary | ICD-10-CM | POA: Diagnosis not present

## 2015-01-15 DIAGNOSIS — I5022 Chronic systolic (congestive) heart failure: Secondary | ICD-10-CM | POA: Diagnosis not present

## 2015-01-15 DIAGNOSIS — Z9181 History of falling: Secondary | ICD-10-CM | POA: Diagnosis not present

## 2015-01-15 DIAGNOSIS — I6931 Cognitive deficits following cerebral infarction: Secondary | ICD-10-CM | POA: Diagnosis not present

## 2015-01-15 DIAGNOSIS — R2689 Other abnormalities of gait and mobility: Secondary | ICD-10-CM | POA: Diagnosis not present

## 2015-01-15 DIAGNOSIS — Z96641 Presence of right artificial hip joint: Secondary | ICD-10-CM | POA: Diagnosis not present

## 2015-01-15 DIAGNOSIS — I2510 Atherosclerotic heart disease of native coronary artery without angina pectoris: Secondary | ICD-10-CM | POA: Diagnosis not present

## 2015-01-19 DIAGNOSIS — I2510 Atherosclerotic heart disease of native coronary artery without angina pectoris: Secondary | ICD-10-CM | POA: Diagnosis not present

## 2015-01-19 DIAGNOSIS — Z9181 History of falling: Secondary | ICD-10-CM | POA: Diagnosis not present

## 2015-01-19 DIAGNOSIS — Z96641 Presence of right artificial hip joint: Secondary | ICD-10-CM | POA: Diagnosis not present

## 2015-01-19 DIAGNOSIS — R2689 Other abnormalities of gait and mobility: Secondary | ICD-10-CM | POA: Diagnosis not present

## 2015-01-19 DIAGNOSIS — I5022 Chronic systolic (congestive) heart failure: Secondary | ICD-10-CM | POA: Diagnosis not present

## 2015-01-19 DIAGNOSIS — I6931 Cognitive deficits following cerebral infarction: Secondary | ICD-10-CM | POA: Diagnosis not present

## 2015-01-20 DIAGNOSIS — I5022 Chronic systolic (congestive) heart failure: Secondary | ICD-10-CM | POA: Diagnosis not present

## 2015-01-20 DIAGNOSIS — Z96641 Presence of right artificial hip joint: Secondary | ICD-10-CM | POA: Diagnosis not present

## 2015-01-20 DIAGNOSIS — I6931 Cognitive deficits following cerebral infarction: Secondary | ICD-10-CM | POA: Diagnosis not present

## 2015-01-20 DIAGNOSIS — I2510 Atherosclerotic heart disease of native coronary artery without angina pectoris: Secondary | ICD-10-CM | POA: Diagnosis not present

## 2015-01-20 DIAGNOSIS — Z9181 History of falling: Secondary | ICD-10-CM | POA: Diagnosis not present

## 2015-01-20 DIAGNOSIS — R2689 Other abnormalities of gait and mobility: Secondary | ICD-10-CM | POA: Diagnosis not present

## 2015-01-21 DIAGNOSIS — R2689 Other abnormalities of gait and mobility: Secondary | ICD-10-CM | POA: Diagnosis not present

## 2015-01-21 DIAGNOSIS — I6931 Cognitive deficits following cerebral infarction: Secondary | ICD-10-CM | POA: Diagnosis not present

## 2015-01-21 DIAGNOSIS — I2510 Atherosclerotic heart disease of native coronary artery without angina pectoris: Secondary | ICD-10-CM | POA: Diagnosis not present

## 2015-01-21 DIAGNOSIS — Z9181 History of falling: Secondary | ICD-10-CM | POA: Diagnosis not present

## 2015-01-21 DIAGNOSIS — I5022 Chronic systolic (congestive) heart failure: Secondary | ICD-10-CM | POA: Diagnosis not present

## 2015-01-21 DIAGNOSIS — Z96641 Presence of right artificial hip joint: Secondary | ICD-10-CM | POA: Diagnosis not present

## 2015-01-22 DIAGNOSIS — Z96641 Presence of right artificial hip joint: Secondary | ICD-10-CM | POA: Diagnosis not present

## 2015-01-22 DIAGNOSIS — I5022 Chronic systolic (congestive) heart failure: Secondary | ICD-10-CM | POA: Diagnosis not present

## 2015-01-22 DIAGNOSIS — Z9181 History of falling: Secondary | ICD-10-CM | POA: Diagnosis not present

## 2015-01-22 DIAGNOSIS — I2510 Atherosclerotic heart disease of native coronary artery without angina pectoris: Secondary | ICD-10-CM | POA: Diagnosis not present

## 2015-01-22 DIAGNOSIS — R2689 Other abnormalities of gait and mobility: Secondary | ICD-10-CM | POA: Diagnosis not present

## 2015-01-22 DIAGNOSIS — I6931 Cognitive deficits following cerebral infarction: Secondary | ICD-10-CM | POA: Diagnosis not present

## 2015-01-26 DIAGNOSIS — Z9181 History of falling: Secondary | ICD-10-CM | POA: Diagnosis not present

## 2015-01-26 DIAGNOSIS — I5022 Chronic systolic (congestive) heart failure: Secondary | ICD-10-CM | POA: Diagnosis not present

## 2015-01-26 DIAGNOSIS — I6931 Cognitive deficits following cerebral infarction: Secondary | ICD-10-CM | POA: Diagnosis not present

## 2015-01-26 DIAGNOSIS — Z96641 Presence of right artificial hip joint: Secondary | ICD-10-CM | POA: Diagnosis not present

## 2015-01-26 DIAGNOSIS — R2689 Other abnormalities of gait and mobility: Secondary | ICD-10-CM | POA: Diagnosis not present

## 2015-01-26 DIAGNOSIS — I2510 Atherosclerotic heart disease of native coronary artery without angina pectoris: Secondary | ICD-10-CM | POA: Diagnosis not present

## 2015-01-27 ENCOUNTER — Ambulatory Visit: Payer: Medicare Other | Admitting: Neurology

## 2015-01-27 DIAGNOSIS — R2689 Other abnormalities of gait and mobility: Secondary | ICD-10-CM | POA: Diagnosis not present

## 2015-01-27 DIAGNOSIS — I6931 Cognitive deficits following cerebral infarction: Secondary | ICD-10-CM | POA: Diagnosis not present

## 2015-01-27 DIAGNOSIS — Z96641 Presence of right artificial hip joint: Secondary | ICD-10-CM | POA: Diagnosis not present

## 2015-01-27 DIAGNOSIS — Z9181 History of falling: Secondary | ICD-10-CM | POA: Diagnosis not present

## 2015-01-27 DIAGNOSIS — I2510 Atherosclerotic heart disease of native coronary artery without angina pectoris: Secondary | ICD-10-CM | POA: Diagnosis not present

## 2015-01-27 DIAGNOSIS — I5022 Chronic systolic (congestive) heart failure: Secondary | ICD-10-CM | POA: Diagnosis not present

## 2015-01-29 DIAGNOSIS — R2689 Other abnormalities of gait and mobility: Secondary | ICD-10-CM | POA: Diagnosis not present

## 2015-01-29 DIAGNOSIS — Z96641 Presence of right artificial hip joint: Secondary | ICD-10-CM | POA: Diagnosis not present

## 2015-01-29 DIAGNOSIS — Z9181 History of falling: Secondary | ICD-10-CM | POA: Diagnosis not present

## 2015-01-29 DIAGNOSIS — I2510 Atherosclerotic heart disease of native coronary artery without angina pectoris: Secondary | ICD-10-CM | POA: Diagnosis not present

## 2015-01-29 DIAGNOSIS — I5022 Chronic systolic (congestive) heart failure: Secondary | ICD-10-CM | POA: Diagnosis not present

## 2015-01-29 DIAGNOSIS — I6931 Cognitive deficits following cerebral infarction: Secondary | ICD-10-CM | POA: Diagnosis not present

## 2015-01-31 ENCOUNTER — Encounter (HOSPITAL_COMMUNITY): Payer: Self-pay | Admitting: Emergency Medicine

## 2015-01-31 ENCOUNTER — Emergency Department (HOSPITAL_COMMUNITY)
Admission: EM | Admit: 2015-01-31 | Discharge: 2015-01-31 | Disposition: A | Discharge status: Home / Self Care | Payer: Medicare Other | Attending: Emergency Medicine | Admitting: Emergency Medicine

## 2015-01-31 ENCOUNTER — Emergency Department (HOSPITAL_COMMUNITY): Payer: Medicare Other

## 2015-01-31 DIAGNOSIS — R410 Disorientation, unspecified: Secondary | ICD-10-CM | POA: Insufficient documentation

## 2015-01-31 DIAGNOSIS — Z79899 Other long term (current) drug therapy: Secondary | ICD-10-CM | POA: Diagnosis not present

## 2015-01-31 DIAGNOSIS — S0181XA Laceration without foreign body of other part of head, initial encounter: Secondary | ICD-10-CM | POA: Insufficient documentation

## 2015-01-31 DIAGNOSIS — W07XXXA Fall from chair, initial encounter: Secondary | ICD-10-CM | POA: Insufficient documentation

## 2015-01-31 DIAGNOSIS — Z8673 Personal history of transient ischemic attack (TIA), and cerebral infarction without residual deficits: Secondary | ICD-10-CM | POA: Diagnosis not present

## 2015-01-31 DIAGNOSIS — E785 Hyperlipidemia, unspecified: Secondary | ICD-10-CM | POA: Diagnosis not present

## 2015-01-31 DIAGNOSIS — I5022 Chronic systolic (congestive) heart failure: Secondary | ICD-10-CM | POA: Diagnosis not present

## 2015-01-31 DIAGNOSIS — S098XXA Other specified injuries of head, initial encounter: Secondary | ICD-10-CM | POA: Diagnosis not present

## 2015-01-31 DIAGNOSIS — S01111A Laceration without foreign body of right eyelid and periocular area, initial encounter: Secondary | ICD-10-CM | POA: Diagnosis not present

## 2015-01-31 DIAGNOSIS — Y9289 Other specified places as the place of occurrence of the external cause: Secondary | ICD-10-CM | POA: Insufficient documentation

## 2015-01-31 DIAGNOSIS — S0191XA Laceration without foreign body of unspecified part of head, initial encounter: Secondary | ICD-10-CM

## 2015-01-31 DIAGNOSIS — S0081XA Abrasion of other part of head, initial encounter: Secondary | ICD-10-CM | POA: Diagnosis not present

## 2015-01-31 DIAGNOSIS — M109 Gout, unspecified: Secondary | ICD-10-CM | POA: Insufficient documentation

## 2015-01-31 DIAGNOSIS — Y998 Other external cause status: Secondary | ICD-10-CM | POA: Insufficient documentation

## 2015-01-31 DIAGNOSIS — S0990XA Unspecified injury of head, initial encounter: Secondary | ICD-10-CM | POA: Diagnosis not present

## 2015-01-31 DIAGNOSIS — Y9389 Activity, other specified: Secondary | ICD-10-CM | POA: Insufficient documentation

## 2015-01-31 DIAGNOSIS — I2510 Atherosclerotic heart disease of native coronary artery without angina pectoris: Secondary | ICD-10-CM | POA: Diagnosis not present

## 2015-01-31 DIAGNOSIS — I119 Hypertensive heart disease without heart failure: Secondary | ICD-10-CM | POA: Diagnosis not present
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
## 2015-01-31 NOTE — ED Notes (Signed)
Silverio Lay, MD at bedside performing bedside procedure

## 2015-01-31 NOTE — Discharge Instructions (Signed)
Don't wash your face today, you may wash it tomorrow.  The dermabond will likely dissolve or come off in several days.   See your doctor.   Return to ER if you have severe headaches, vomiting.

## 2015-01-31 NOTE — ED Notes (Signed)
Per [facility], Pt from home, pt hx of stroke with some residual confusion which is patients norm, unwitnessed fall, they think patient went to sit in chair but missed the chair, pt has laceration to R eyebrow and abraision to R cheek. Pt is alert, in NAD. VSS.

## 2015-02-01 DIAGNOSIS — Z96641 Presence of right artificial hip joint: Secondary | ICD-10-CM | POA: Diagnosis not present

## 2015-02-01 DIAGNOSIS — I5022 Chronic systolic (congestive) heart failure: Secondary | ICD-10-CM | POA: Diagnosis not present

## 2015-02-01 DIAGNOSIS — Z9181 History of falling: Secondary | ICD-10-CM | POA: Diagnosis not present

## 2015-02-01 DIAGNOSIS — R2689 Other abnormalities of gait and mobility: Secondary | ICD-10-CM | POA: Diagnosis not present

## 2015-02-01 DIAGNOSIS — I2510 Atherosclerotic heart disease of native coronary artery without angina pectoris: Secondary | ICD-10-CM | POA: Diagnosis not present

## 2015-02-01 DIAGNOSIS — I6931 Cognitive deficits following cerebral infarction: Secondary | ICD-10-CM | POA: Diagnosis not present

## 2015-02-02 DIAGNOSIS — I2510 Atherosclerotic heart disease of native coronary artery without angina pectoris: Secondary | ICD-10-CM | POA: Diagnosis not present

## 2015-02-02 DIAGNOSIS — I6931 Cognitive deficits following cerebral infarction: Secondary | ICD-10-CM | POA: Diagnosis not present

## 2015-02-02 DIAGNOSIS — Z9181 History of falling: Secondary | ICD-10-CM | POA: Diagnosis not present

## 2015-02-02 DIAGNOSIS — Z96641 Presence of right artificial hip joint: Secondary | ICD-10-CM | POA: Diagnosis not present

## 2015-02-02 DIAGNOSIS — I5022 Chronic systolic (congestive) heart failure: Secondary | ICD-10-CM | POA: Diagnosis not present

## 2015-02-02 DIAGNOSIS — R2689 Other abnormalities of gait and mobility: Secondary | ICD-10-CM | POA: Diagnosis not present

## 2015-02-03 DIAGNOSIS — R2689 Other abnormalities of gait and mobility: Secondary | ICD-10-CM | POA: Diagnosis not present

## 2015-02-03 DIAGNOSIS — I5022 Chronic systolic (congestive) heart failure: Secondary | ICD-10-CM | POA: Diagnosis not present

## 2015-02-03 DIAGNOSIS — Z96641 Presence of right artificial hip joint: Secondary | ICD-10-CM | POA: Diagnosis not present

## 2015-02-03 DIAGNOSIS — Z9181 History of falling: Secondary | ICD-10-CM | POA: Diagnosis not present

## 2015-02-03 DIAGNOSIS — I2510 Atherosclerotic heart disease of native coronary artery without angina pectoris: Secondary | ICD-10-CM | POA: Diagnosis not present

## 2015-02-03 DIAGNOSIS — I6931 Cognitive deficits following cerebral infarction: Secondary | ICD-10-CM | POA: Diagnosis not present

## 2015-02-04 DIAGNOSIS — I5022 Chronic systolic (congestive) heart failure: Secondary | ICD-10-CM | POA: Diagnosis not present

## 2015-02-04 DIAGNOSIS — R2689 Other abnormalities of gait and mobility: Secondary | ICD-10-CM | POA: Diagnosis not present

## 2015-02-04 DIAGNOSIS — Z9181 History of falling: Secondary | ICD-10-CM | POA: Diagnosis not present

## 2015-02-04 DIAGNOSIS — I2510 Atherosclerotic heart disease of native coronary artery without angina pectoris: Secondary | ICD-10-CM | POA: Diagnosis not present

## 2015-02-04 DIAGNOSIS — I6931 Cognitive deficits following cerebral infarction: Secondary | ICD-10-CM | POA: Diagnosis not present

## 2015-02-04 DIAGNOSIS — Z96641 Presence of right artificial hip joint: Secondary | ICD-10-CM | POA: Diagnosis not present

## 2015-02-05 DIAGNOSIS — I5022 Chronic systolic (congestive) heart failure: Secondary | ICD-10-CM | POA: Diagnosis not present

## 2015-02-05 DIAGNOSIS — Z96641 Presence of right artificial hip joint: Secondary | ICD-10-CM | POA: Diagnosis not present

## 2015-02-05 DIAGNOSIS — I2510 Atherosclerotic heart disease of native coronary artery without angina pectoris: Secondary | ICD-10-CM | POA: Diagnosis not present

## 2015-02-05 DIAGNOSIS — Z9181 History of falling: Secondary | ICD-10-CM | POA: Diagnosis not present

## 2015-02-05 DIAGNOSIS — R2689 Other abnormalities of gait and mobility: Secondary | ICD-10-CM | POA: Diagnosis not present

## 2015-02-05 DIAGNOSIS — I6931 Cognitive deficits following cerebral infarction: Secondary | ICD-10-CM | POA: Diagnosis not present

## 2015-02-08 DIAGNOSIS — I5022 Chronic systolic (congestive) heart failure: Secondary | ICD-10-CM | POA: Diagnosis not present

## 2015-02-08 DIAGNOSIS — Z96641 Presence of right artificial hip joint: Secondary | ICD-10-CM | POA: Diagnosis not present

## 2015-02-08 DIAGNOSIS — R2689 Other abnormalities of gait and mobility: Secondary | ICD-10-CM | POA: Diagnosis not present

## 2015-02-08 DIAGNOSIS — I2510 Atherosclerotic heart disease of native coronary artery without angina pectoris: Secondary | ICD-10-CM | POA: Diagnosis not present

## 2015-02-08 DIAGNOSIS — I6931 Cognitive deficits following cerebral infarction: Secondary | ICD-10-CM | POA: Diagnosis not present

## 2015-02-08 DIAGNOSIS — Z9181 History of falling: Secondary | ICD-10-CM | POA: Diagnosis not present

## 2015-02-10 DIAGNOSIS — I2510 Atherosclerotic heart disease of native coronary artery without angina pectoris: Secondary | ICD-10-CM | POA: Diagnosis not present

## 2015-02-10 DIAGNOSIS — R2689 Other abnormalities of gait and mobility: Secondary | ICD-10-CM | POA: Diagnosis not present

## 2015-02-10 DIAGNOSIS — Z96641 Presence of right artificial hip joint: Secondary | ICD-10-CM | POA: Diagnosis not present

## 2015-02-10 DIAGNOSIS — Z9181 History of falling: Secondary | ICD-10-CM | POA: Diagnosis not present

## 2015-02-10 DIAGNOSIS — I6931 Cognitive deficits following cerebral infarction: Secondary | ICD-10-CM | POA: Diagnosis not present

## 2015-02-10 DIAGNOSIS — I5022 Chronic systolic (congestive) heart failure: Secondary | ICD-10-CM | POA: Diagnosis not present

## 2015-02-12 DIAGNOSIS — Z9181 History of falling: Secondary | ICD-10-CM | POA: Diagnosis not present

## 2015-02-12 DIAGNOSIS — Z96641 Presence of right artificial hip joint: Secondary | ICD-10-CM | POA: Diagnosis not present

## 2015-02-12 DIAGNOSIS — I5022 Chronic systolic (congestive) heart failure: Secondary | ICD-10-CM | POA: Diagnosis not present

## 2015-02-12 DIAGNOSIS — I2510 Atherosclerotic heart disease of native coronary artery without angina pectoris: Secondary | ICD-10-CM | POA: Diagnosis not present

## 2015-02-12 DIAGNOSIS — I6931 Cognitive deficits following cerebral infarction: Secondary | ICD-10-CM | POA: Diagnosis not present

## 2015-02-12 DIAGNOSIS — R2689 Other abnormalities of gait and mobility: Secondary | ICD-10-CM | POA: Diagnosis not present

## 2015-02-15 DIAGNOSIS — Z9181 History of falling: Secondary | ICD-10-CM | POA: Diagnosis not present

## 2015-02-15 DIAGNOSIS — R2689 Other abnormalities of gait and mobility: Secondary | ICD-10-CM | POA: Diagnosis not present

## 2015-02-15 DIAGNOSIS — I5022 Chronic systolic (congestive) heart failure: Secondary | ICD-10-CM | POA: Diagnosis not present

## 2015-02-15 DIAGNOSIS — Z96641 Presence of right artificial hip joint: Secondary | ICD-10-CM | POA: Diagnosis not present

## 2015-02-15 DIAGNOSIS — I6931 Cognitive deficits following cerebral infarction: Secondary | ICD-10-CM | POA: Diagnosis not present

## 2015-02-15 DIAGNOSIS — I2510 Atherosclerotic heart disease of native coronary artery without angina pectoris: Secondary | ICD-10-CM | POA: Diagnosis not present

## 2015-02-17 DIAGNOSIS — Z96641 Presence of right artificial hip joint: Secondary | ICD-10-CM | POA: Diagnosis not present

## 2015-02-17 DIAGNOSIS — Z9181 History of falling: Secondary | ICD-10-CM | POA: Diagnosis not present

## 2015-02-17 DIAGNOSIS — I2510 Atherosclerotic heart disease of native coronary artery without angina pectoris: Secondary | ICD-10-CM | POA: Diagnosis not present

## 2015-02-17 DIAGNOSIS — I6931 Cognitive deficits following cerebral infarction: Secondary | ICD-10-CM | POA: Diagnosis not present

## 2015-02-17 DIAGNOSIS — R2689 Other abnormalities of gait and mobility: Secondary | ICD-10-CM | POA: Diagnosis not present

## 2015-02-17 DIAGNOSIS — I5022 Chronic systolic (congestive) heart failure: Secondary | ICD-10-CM | POA: Diagnosis not present

## 2015-02-19 DIAGNOSIS — Z9181 History of falling: Secondary | ICD-10-CM | POA: Diagnosis not present

## 2015-02-19 DIAGNOSIS — R2689 Other abnormalities of gait and mobility: Secondary | ICD-10-CM | POA: Diagnosis not present

## 2015-02-19 DIAGNOSIS — I5022 Chronic systolic (congestive) heart failure: Secondary | ICD-10-CM | POA: Diagnosis not present

## 2015-02-19 DIAGNOSIS — Z96641 Presence of right artificial hip joint: Secondary | ICD-10-CM | POA: Diagnosis not present

## 2015-02-19 DIAGNOSIS — I2510 Atherosclerotic heart disease of native coronary artery without angina pectoris: Secondary | ICD-10-CM | POA: Diagnosis not present

## 2015-02-19 DIAGNOSIS — I6931 Cognitive deficits following cerebral infarction: Secondary | ICD-10-CM | POA: Diagnosis not present

## 2015-02-22 ENCOUNTER — Other Ambulatory Visit: Payer: Self-pay | Admitting: Internal Medicine

## 2015-02-22 DIAGNOSIS — I5022 Chronic systolic (congestive) heart failure: Secondary | ICD-10-CM | POA: Diagnosis not present

## 2015-02-22 DIAGNOSIS — I2510 Atherosclerotic heart disease of native coronary artery without angina pectoris: Secondary | ICD-10-CM | POA: Diagnosis not present

## 2015-02-22 DIAGNOSIS — I6931 Cognitive deficits following cerebral infarction: Secondary | ICD-10-CM | POA: Diagnosis not present

## 2015-02-22 DIAGNOSIS — Z9181 History of falling: Secondary | ICD-10-CM | POA: Diagnosis not present

## 2015-02-22 DIAGNOSIS — R2689 Other abnormalities of gait and mobility: Secondary | ICD-10-CM | POA: Diagnosis not present

## 2015-02-22 DIAGNOSIS — Z96641 Presence of right artificial hip joint: Secondary | ICD-10-CM | POA: Diagnosis not present

## 2015-02-24 DIAGNOSIS — R2689 Other abnormalities of gait and mobility: Secondary | ICD-10-CM | POA: Diagnosis not present

## 2015-02-24 DIAGNOSIS — I6931 Cognitive deficits following cerebral infarction: Secondary | ICD-10-CM | POA: Diagnosis not present

## 2015-02-24 DIAGNOSIS — I2510 Atherosclerotic heart disease of native coronary artery without angina pectoris: Secondary | ICD-10-CM | POA: Diagnosis not present

## 2015-02-24 DIAGNOSIS — I5022 Chronic systolic (congestive) heart failure: Secondary | ICD-10-CM | POA: Diagnosis not present

## 2015-02-24 DIAGNOSIS — Z9181 History of falling: Secondary | ICD-10-CM | POA: Diagnosis not present

## 2015-02-24 DIAGNOSIS — Z96641 Presence of right artificial hip joint: Secondary | ICD-10-CM | POA: Diagnosis not present

## 2015-02-26 DIAGNOSIS — Z9181 History of falling: Secondary | ICD-10-CM | POA: Diagnosis not present

## 2015-02-26 DIAGNOSIS — R2689 Other abnormalities of gait and mobility: Secondary | ICD-10-CM | POA: Diagnosis not present

## 2015-02-26 DIAGNOSIS — I2510 Atherosclerotic heart disease of native coronary artery without angina pectoris: Secondary | ICD-10-CM | POA: Diagnosis not present

## 2015-02-26 DIAGNOSIS — I6931 Cognitive deficits following cerebral infarction: Secondary | ICD-10-CM | POA: Diagnosis not present

## 2015-02-26 DIAGNOSIS — Z96641 Presence of right artificial hip joint: Secondary | ICD-10-CM | POA: Diagnosis not present

## 2015-02-26 DIAGNOSIS — I5022 Chronic systolic (congestive) heart failure: Secondary | ICD-10-CM | POA: Diagnosis not present

## 2015-03-02 DIAGNOSIS — R2689 Other abnormalities of gait and mobility: Secondary | ICD-10-CM | POA: Diagnosis not present

## 2015-03-02 DIAGNOSIS — Z96641 Presence of right artificial hip joint: Secondary | ICD-10-CM | POA: Diagnosis not present

## 2015-03-02 DIAGNOSIS — Z9181 History of falling: Secondary | ICD-10-CM | POA: Diagnosis not present

## 2015-03-02 DIAGNOSIS — I2510 Atherosclerotic heart disease of native coronary artery without angina pectoris: Secondary | ICD-10-CM | POA: Diagnosis not present

## 2015-03-02 DIAGNOSIS — I5022 Chronic systolic (congestive) heart failure: Secondary | ICD-10-CM | POA: Diagnosis not present

## 2015-03-02 DIAGNOSIS — I6931 Cognitive deficits following cerebral infarction: Secondary | ICD-10-CM | POA: Diagnosis not present

## 2015-03-03 DIAGNOSIS — I5022 Chronic systolic (congestive) heart failure: Secondary | ICD-10-CM | POA: Diagnosis not present

## 2015-03-03 DIAGNOSIS — I6931 Cognitive deficits following cerebral infarction: Secondary | ICD-10-CM | POA: Diagnosis not present

## 2015-03-03 DIAGNOSIS — R2689 Other abnormalities of gait and mobility: Secondary | ICD-10-CM | POA: Diagnosis not present

## 2015-03-03 DIAGNOSIS — Z96641 Presence of right artificial hip joint: Secondary | ICD-10-CM | POA: Diagnosis not present

## 2015-03-03 DIAGNOSIS — Z9181 History of falling: Secondary | ICD-10-CM | POA: Diagnosis not present

## 2015-03-03 DIAGNOSIS — I2510 Atherosclerotic heart disease of native coronary artery without angina pectoris: Secondary | ICD-10-CM | POA: Diagnosis not present

## 2015-03-05 DIAGNOSIS — I6931 Cognitive deficits following cerebral infarction: Secondary | ICD-10-CM | POA: Diagnosis not present

## 2015-03-05 DIAGNOSIS — I2510 Atherosclerotic heart disease of native coronary artery without angina pectoris: Secondary | ICD-10-CM | POA: Diagnosis not present

## 2015-03-05 DIAGNOSIS — R2689 Other abnormalities of gait and mobility: Secondary | ICD-10-CM | POA: Diagnosis not present

## 2015-03-05 DIAGNOSIS — Z9181 History of falling: Secondary | ICD-10-CM | POA: Diagnosis not present

## 2015-03-05 DIAGNOSIS — I5022 Chronic systolic (congestive) heart failure: Secondary | ICD-10-CM | POA: Diagnosis not present

## 2015-03-05 DIAGNOSIS — Z96641 Presence of right artificial hip joint: Secondary | ICD-10-CM | POA: Diagnosis not present

## 2015-03-09 DIAGNOSIS — R2689 Other abnormalities of gait and mobility: Secondary | ICD-10-CM | POA: Diagnosis not present

## 2015-03-09 DIAGNOSIS — Z96641 Presence of right artificial hip joint: Secondary | ICD-10-CM | POA: Diagnosis not present

## 2015-03-09 DIAGNOSIS — I2510 Atherosclerotic heart disease of native coronary artery without angina pectoris: Secondary | ICD-10-CM | POA: Diagnosis not present

## 2015-03-09 DIAGNOSIS — I6931 Cognitive deficits following cerebral infarction: Secondary | ICD-10-CM | POA: Diagnosis not present

## 2015-03-09 DIAGNOSIS — Z9181 History of falling: Secondary | ICD-10-CM | POA: Diagnosis not present

## 2015-03-09 DIAGNOSIS — I5022 Chronic systolic (congestive) heart failure: Secondary | ICD-10-CM | POA: Diagnosis not present

## 2015-03-11 DIAGNOSIS — R2689 Other abnormalities of gait and mobility: Secondary | ICD-10-CM | POA: Diagnosis not present

## 2015-03-11 DIAGNOSIS — Z9181 History of falling: Secondary | ICD-10-CM | POA: Diagnosis not present

## 2015-03-11 DIAGNOSIS — I6931 Cognitive deficits following cerebral infarction: Secondary | ICD-10-CM | POA: Diagnosis not present

## 2015-03-11 DIAGNOSIS — I5022 Chronic systolic (congestive) heart failure: Secondary | ICD-10-CM | POA: Diagnosis not present

## 2015-03-11 DIAGNOSIS — Z96641 Presence of right artificial hip joint: Secondary | ICD-10-CM | POA: Diagnosis not present

## 2015-03-11 DIAGNOSIS — I2510 Atherosclerotic heart disease of native coronary artery without angina pectoris: Secondary | ICD-10-CM | POA: Diagnosis not present

## 2015-03-14 DIAGNOSIS — F419 Anxiety disorder, unspecified: Secondary | ICD-10-CM | POA: Diagnosis not present

## 2015-03-14 DIAGNOSIS — Z9181 History of falling: Secondary | ICD-10-CM | POA: Diagnosis not present

## 2015-03-14 DIAGNOSIS — I69844 Monoplegia of lower limb following other cerebrovascular disease affecting left non-dominant side: Secondary | ICD-10-CM | POA: Diagnosis not present

## 2015-03-14 DIAGNOSIS — I5022 Chronic systolic (congestive) heart failure: Secondary | ICD-10-CM | POA: Diagnosis not present

## 2015-03-14 DIAGNOSIS — I2510 Atherosclerotic heart disease of native coronary artery without angina pectoris: Secondary | ICD-10-CM | POA: Diagnosis not present

## 2015-03-14 DIAGNOSIS — R41841 Cognitive communication deficit: Secondary | ICD-10-CM | POA: Diagnosis not present

## 2015-03-14 DIAGNOSIS — I10 Essential (primary) hypertension: Secondary | ICD-10-CM | POA: Diagnosis not present

## 2015-03-14 DIAGNOSIS — R739 Hyperglycemia, unspecified: Secondary | ICD-10-CM | POA: Diagnosis not present

## 2015-03-14 DIAGNOSIS — Z96641 Presence of right artificial hip joint: Secondary | ICD-10-CM | POA: Diagnosis not present

## 2015-03-17 DIAGNOSIS — I2510 Atherosclerotic heart disease of native coronary artery without angina pectoris: Secondary | ICD-10-CM | POA: Diagnosis not present

## 2015-03-17 DIAGNOSIS — Z9181 History of falling: Secondary | ICD-10-CM | POA: Diagnosis not present

## 2015-03-17 DIAGNOSIS — Z96641 Presence of right artificial hip joint: Secondary | ICD-10-CM | POA: Diagnosis not present

## 2015-03-17 DIAGNOSIS — R41841 Cognitive communication deficit: Secondary | ICD-10-CM | POA: Diagnosis not present

## 2015-03-17 DIAGNOSIS — I69844 Monoplegia of lower limb following other cerebrovascular disease affecting left non-dominant side: Secondary | ICD-10-CM | POA: Diagnosis not present

## 2015-03-17 DIAGNOSIS — I5022 Chronic systolic (congestive) heart failure: Secondary | ICD-10-CM | POA: Diagnosis not present

## 2015-03-19 DIAGNOSIS — I2510 Atherosclerotic heart disease of native coronary artery without angina pectoris: Secondary | ICD-10-CM | POA: Diagnosis not present

## 2015-03-19 DIAGNOSIS — Z9181 History of falling: Secondary | ICD-10-CM | POA: Diagnosis not present

## 2015-03-19 DIAGNOSIS — R41841 Cognitive communication deficit: Secondary | ICD-10-CM | POA: Diagnosis not present

## 2015-03-19 DIAGNOSIS — I69844 Monoplegia of lower limb following other cerebrovascular disease affecting left non-dominant side: Secondary | ICD-10-CM | POA: Diagnosis not present

## 2015-03-19 DIAGNOSIS — Z96641 Presence of right artificial hip joint: Secondary | ICD-10-CM | POA: Diagnosis not present

## 2015-03-19 DIAGNOSIS — I5022 Chronic systolic (congestive) heart failure: Secondary | ICD-10-CM | POA: Diagnosis not present

## 2015-03-24 DIAGNOSIS — R41841 Cognitive communication deficit: Secondary | ICD-10-CM | POA: Diagnosis not present

## 2015-03-24 DIAGNOSIS — I2510 Atherosclerotic heart disease of native coronary artery without angina pectoris: Secondary | ICD-10-CM | POA: Diagnosis not present

## 2015-03-24 DIAGNOSIS — I5022 Chronic systolic (congestive) heart failure: Secondary | ICD-10-CM | POA: Diagnosis not present

## 2015-03-24 DIAGNOSIS — Z9181 History of falling: Secondary | ICD-10-CM | POA: Diagnosis not present

## 2015-03-24 DIAGNOSIS — I69844 Monoplegia of lower limb following other cerebrovascular disease affecting left non-dominant side: Secondary | ICD-10-CM | POA: Diagnosis not present

## 2015-03-24 DIAGNOSIS — Z96641 Presence of right artificial hip joint: Secondary | ICD-10-CM | POA: Diagnosis not present

## 2015-03-26 DIAGNOSIS — Z96641 Presence of right artificial hip joint: Secondary | ICD-10-CM | POA: Diagnosis not present

## 2015-03-26 DIAGNOSIS — I2510 Atherosclerotic heart disease of native coronary artery without angina pectoris: Secondary | ICD-10-CM | POA: Diagnosis not present

## 2015-03-26 DIAGNOSIS — R41841 Cognitive communication deficit: Secondary | ICD-10-CM | POA: Diagnosis not present

## 2015-03-26 DIAGNOSIS — I69844 Monoplegia of lower limb following other cerebrovascular disease affecting left non-dominant side: Secondary | ICD-10-CM | POA: Diagnosis not present

## 2015-03-26 DIAGNOSIS — I5022 Chronic systolic (congestive) heart failure: Secondary | ICD-10-CM | POA: Diagnosis not present

## 2015-03-26 DIAGNOSIS — Z9181 History of falling: Secondary | ICD-10-CM | POA: Diagnosis not present

## 2015-03-30 DIAGNOSIS — I2510 Atherosclerotic heart disease of native coronary artery without angina pectoris: Secondary | ICD-10-CM | POA: Diagnosis not present

## 2015-03-30 DIAGNOSIS — R41841 Cognitive communication deficit: Secondary | ICD-10-CM | POA: Diagnosis not present

## 2015-03-30 DIAGNOSIS — Z9181 History of falling: Secondary | ICD-10-CM | POA: Diagnosis not present

## 2015-03-30 DIAGNOSIS — Z96641 Presence of right artificial hip joint: Secondary | ICD-10-CM | POA: Diagnosis not present

## 2015-03-30 DIAGNOSIS — I69844 Monoplegia of lower limb following other cerebrovascular disease affecting left non-dominant side: Secondary | ICD-10-CM | POA: Diagnosis not present

## 2015-03-30 DIAGNOSIS — I5022 Chronic systolic (congestive) heart failure: Secondary | ICD-10-CM | POA: Diagnosis not present

## 2015-03-31 DIAGNOSIS — Z9181 History of falling: Secondary | ICD-10-CM | POA: Diagnosis not present

## 2015-03-31 DIAGNOSIS — R41841 Cognitive communication deficit: Secondary | ICD-10-CM | POA: Diagnosis not present

## 2015-03-31 DIAGNOSIS — Z96641 Presence of right artificial hip joint: Secondary | ICD-10-CM | POA: Diagnosis not present

## 2015-03-31 DIAGNOSIS — I5022 Chronic systolic (congestive) heart failure: Secondary | ICD-10-CM | POA: Diagnosis not present

## 2015-03-31 DIAGNOSIS — I2510 Atherosclerotic heart disease of native coronary artery without angina pectoris: Secondary | ICD-10-CM | POA: Diagnosis not present

## 2015-03-31 DIAGNOSIS — I69844 Monoplegia of lower limb following other cerebrovascular disease affecting left non-dominant side: Secondary | ICD-10-CM | POA: Diagnosis not present

## 2015-04-16 ENCOUNTER — Ambulatory Visit: Payer: Self-pay | Admitting: Internal Medicine

## 2015-04-30 ENCOUNTER — Encounter: Payer: Medicare Other | Admitting: Interventional Cardiology

## 2015-04-30 DIAGNOSIS — R0989 Other specified symptoms and signs involving the circulatory and respiratory systems: Secondary | ICD-10-CM

## 2015-05-03 DIAGNOSIS — Z9181 History of falling: Secondary | ICD-10-CM | POA: Diagnosis not present

## 2015-05-03 DIAGNOSIS — I2510 Atherosclerotic heart disease of native coronary artery without angina pectoris: Secondary | ICD-10-CM | POA: Diagnosis not present

## 2015-05-03 DIAGNOSIS — R41841 Cognitive communication deficit: Secondary | ICD-10-CM | POA: Diagnosis not present

## 2015-05-03 DIAGNOSIS — Z96641 Presence of right artificial hip joint: Secondary | ICD-10-CM | POA: Diagnosis not present

## 2015-05-03 DIAGNOSIS — I5022 Chronic systolic (congestive) heart failure: Secondary | ICD-10-CM | POA: Diagnosis not present

## 2015-05-03 DIAGNOSIS — I69844 Monoplegia of lower limb following other cerebrovascular disease affecting left non-dominant side: Secondary | ICD-10-CM | POA: Diagnosis not present

## 2015-05-05 ENCOUNTER — Encounter: Payer: Self-pay | Admitting: Interventional Cardiology

## 2015-05-17 ENCOUNTER — Other Ambulatory Visit: Payer: Self-pay | Admitting: Internal Medicine

## 2015-05-18 ENCOUNTER — Ambulatory Visit: Payer: Self-pay | Admitting: Internal Medicine

## 2015-06-03 ENCOUNTER — Other Ambulatory Visit: Payer: Self-pay | Admitting: Internal Medicine

## 2015-06-03 ENCOUNTER — Other Ambulatory Visit: Payer: Self-pay | Admitting: Physician Assistant

## 2015-06-09 ENCOUNTER — Ambulatory Visit (INDEPENDENT_AMBULATORY_CARE_PROVIDER_SITE_OTHER): Payer: Commercial Managed Care - HMO | Admitting: Internal Medicine

## 2015-06-09 ENCOUNTER — Encounter: Payer: Self-pay | Admitting: Internal Medicine

## 2015-06-09 VITALS — BP 146/90 | HR 66 | Temp 98.2°F | Resp 16 | Ht 68.25 in | Wt 169.0 lb

## 2015-06-09 DIAGNOSIS — Z0001 Encounter for general adult medical examination with abnormal findings: Secondary | ICD-10-CM | POA: Diagnosis not present

## 2015-06-09 DIAGNOSIS — R4182 Altered mental status, unspecified: Secondary | ICD-10-CM

## 2015-06-09 DIAGNOSIS — F411 Generalized anxiety disorder: Secondary | ICD-10-CM

## 2015-06-09 DIAGNOSIS — Z8673 Personal history of transient ischemic attack (TIA), and cerebral infarction without residual deficits: Secondary | ICD-10-CM

## 2015-06-09 DIAGNOSIS — E782 Mixed hyperlipidemia: Secondary | ICD-10-CM | POA: Diagnosis not present

## 2015-06-09 DIAGNOSIS — I10 Essential (primary) hypertension: Secondary | ICD-10-CM | POA: Diagnosis not present

## 2015-06-09 DIAGNOSIS — E785 Hyperlipidemia, unspecified: Secondary | ICD-10-CM

## 2015-06-09 DIAGNOSIS — I272 Pulmonary hypertension, unspecified: Secondary | ICD-10-CM

## 2015-06-09 DIAGNOSIS — M1000 Idiopathic gout, unspecified site: Secondary | ICD-10-CM

## 2015-06-09 DIAGNOSIS — R7303 Prediabetes: Secondary | ICD-10-CM | POA: Diagnosis not present

## 2015-06-09 DIAGNOSIS — Z79899 Other long term (current) drug therapy: Secondary | ICD-10-CM

## 2015-06-09 DIAGNOSIS — I429 Cardiomyopathy, unspecified: Secondary | ICD-10-CM

## 2015-06-09 DIAGNOSIS — Z91199 Patient's noncompliance with other medical treatment and regimen due to unspecified reason: Secondary | ICD-10-CM

## 2015-06-09 DIAGNOSIS — Z1810 Retained metal fragments, unspecified: Secondary | ICD-10-CM

## 2015-06-09 DIAGNOSIS — E559 Vitamin D deficiency, unspecified: Secondary | ICD-10-CM | POA: Diagnosis not present

## 2015-06-09 DIAGNOSIS — I110 Hypertensive heart disease with heart failure: Secondary | ICD-10-CM

## 2015-06-09 DIAGNOSIS — R6889 Other general symptoms and signs: Secondary | ICD-10-CM

## 2015-06-09 DIAGNOSIS — R7309 Other abnormal glucose: Secondary | ICD-10-CM | POA: Diagnosis not present

## 2015-06-09 DIAGNOSIS — R413 Other amnesia: Secondary | ICD-10-CM

## 2015-06-09 DIAGNOSIS — I25119 Atherosclerotic heart disease of native coronary artery with unspecified angina pectoris: Secondary | ICD-10-CM

## 2015-06-09 DIAGNOSIS — I5022 Chronic systolic (congestive) heart failure: Secondary | ICD-10-CM

## 2015-06-09 DIAGNOSIS — Z23 Encounter for immunization: Secondary | ICD-10-CM

## 2015-06-09 DIAGNOSIS — Z9119 Patient's noncompliance with other medical treatment and regimen: Secondary | ICD-10-CM

## 2015-06-09 DIAGNOSIS — Z0000 Encounter for general adult medical examination without abnormal findings: Secondary | ICD-10-CM

## 2015-06-09 LAB — CBC WITH DIFFERENTIAL/PLATELET
BASOS ABS: 0 10*3/uL (ref 0.0–0.1)
BASOS PCT: 0 % (ref 0–1)
Eosinophils Absolute: 0.3 10*3/uL (ref 0.0–0.7)
Eosinophils Relative: 3 % (ref 0–5)
HCT: 42.5 % (ref 39.0–52.0)
HEMOGLOBIN: 14.5 g/dL (ref 13.0–17.0)
Lymphocytes Relative: 22 % (ref 12–46)
Lymphs Abs: 1.9 10*3/uL (ref 0.7–4.0)
MCH: 31.2 pg (ref 26.0–34.0)
MCHC: 34.1 g/dL (ref 30.0–36.0)
MCV: 91.4 fL (ref 78.0–100.0)
MPV: 10.2 fL (ref 8.6–12.4)
Monocytes Absolute: 0.9 10*3/uL (ref 0.1–1.0)
Monocytes Relative: 10 % (ref 3–12)
NEUTROS ABS: 5.6 10*3/uL (ref 1.7–7.7)
NEUTROS PCT: 65 % (ref 43–77)
Platelets: 152 10*3/uL (ref 150–400)
RBC: 4.65 MIL/uL (ref 4.22–5.81)
RDW: 13.8 % (ref 11.5–15.5)
WBC: 8.6 10*3/uL (ref 4.0–10.5)

## 2015-06-09 MED ORDER — LISINOPRIL 30 MG PO TABS: 30.0000 mg | ORAL_TABLET | Freq: Every day | ORAL | Status: DC

## 2015-06-09 NOTE — Patient Instructions (Signed)
Please take 30 mg of lisinopril daily instead of taking 20 mg of lisinopril.  Please try to monitor his blood pressure.  The blood pressure we are looking for is 130-140/60-85.  Please call either Dr. Toni Arthurs or Dr. Georgie Chard who are both dentist in this area.  Please try to get an eye appointment set up.    Please get a flu shot at any local pharmacy.
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
## 2015-06-10 ENCOUNTER — Other Ambulatory Visit: Payer: Self-pay | Admitting: Internal Medicine

## 2015-06-10 LAB — INSULIN, RANDOM: INSULIN: 5.4 u[IU]/mL (ref 2.0–19.6)

## 2015-06-10 LAB — BASIC METABOLIC PANEL WITH GFR
BUN: 19 mg/dL (ref 7–25)
CALCIUM: 9.6 mg/dL (ref 8.6–10.3)
CHLORIDE: 100 mmol/L (ref 98–110)
CO2: 31 mmol/L (ref 20–31)
Creat: 1.34 mg/dL — ABNORMAL HIGH (ref 0.70–1.25)
GFR, EST NON AFRICAN AMERICAN: 54 mL/min — AB (ref >=60)
GFR, Est African American: 63 mL/min (ref >=60)
Glucose, Bld: 92 mg/dL (ref 65–99)
Potassium: 4.2 mmol/L (ref 3.5–5.3)
SODIUM: 140 mmol/L (ref 135–146)

## 2015-06-10 LAB — HEPATIC FUNCTION PANEL
ALT: 10 U/L (ref 9–46)
AST: 16 U/L (ref 10–35)
Albumin: 4.2 g/dL (ref 3.6–5.1)
Alkaline Phosphatase: 67 U/L (ref 40–115)
BILIRUBIN DIRECT: 0.1 mg/dL (ref <=0.2)
BILIRUBIN INDIRECT: 0.6 mg/dL (ref 0.2–1.2)
BILIRUBIN TOTAL: 0.7 mg/dL (ref 0.2–1.2)
Total Protein: 7.4 g/dL (ref 6.1–8.1)

## 2015-06-10 LAB — LIPID PANEL
CHOL/HDL RATIO: 3.7 ratio (ref <=5.0)
Cholesterol: 170 mg/dL (ref 125–200)
HDL: 46 mg/dL (ref >=40)
LDL CALC: 97 mg/dL (ref <130)
TRIGLYCERIDES: 134 mg/dL (ref <150)
VLDL: 27 mg/dL (ref <30)

## 2015-06-10 LAB — HEMOGLOBIN A1C
Hgb A1c MFr Bld: 5.7 % — ABNORMAL HIGH (ref <5.7)
Mean Plasma Glucose: 117 mg/dL — ABNORMAL HIGH (ref <117)

## 2015-06-10 LAB — VITAMIN D 25 HYDROXY (VIT D DEFICIENCY, FRACTURES): Vit D, 25-Hydroxy: 45 ng/mL (ref 30–100)

## 2015-06-10 LAB — TSH: TSH: 1.591 u[IU]/mL (ref 0.350–4.500)

## 2015-06-28 ENCOUNTER — Other Ambulatory Visit: Payer: Self-pay | Admitting: Internal Medicine

## 2015-07-05 ENCOUNTER — Other Ambulatory Visit: Payer: Self-pay | Admitting: Internal Medicine

## 2015-07-07 ENCOUNTER — Ambulatory Visit: Payer: Medicare Other | Admitting: Neurology

## 2015-07-16 ENCOUNTER — Encounter: Payer: Self-pay | Admitting: Neurology

## 2015-07-16 ENCOUNTER — Ambulatory Visit (INDEPENDENT_AMBULATORY_CARE_PROVIDER_SITE_OTHER): Payer: Commercial Managed Care - HMO | Admitting: Neurology

## 2015-07-16 VITALS — BP 132/78 | HR 66 | Ht 69.0 in | Wt 175.0 lb

## 2015-07-16 DIAGNOSIS — F0390 Unspecified dementia without behavioral disturbance: Secondary | ICD-10-CM

## 2015-07-16 DIAGNOSIS — F03A0 Unspecified dementia, mild, without behavioral disturbance, psychotic disturbance, mood disturbance, and anxiety: Secondary | ICD-10-CM

## 2015-07-16 MED ORDER — DONEPEZIL HCL 10 MG PO TABS: ORAL_TABLET | ORAL | Status: DC

## 2015-07-16 NOTE — Patient Instructions (Signed)
1. Start Aricept 10mg : Take 1/2 tablet daily for 1 month, then increase to 1 tablet daily 2. Physical exercise and social interactions are important for brain health 3. Continue to be careful with walking, use walker for support 4. Follow-up in 1 year, call for any changes
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
## 2015-07-19 ENCOUNTER — Encounter: Payer: Self-pay | Admitting: Internal Medicine

## 2015-08-09 ENCOUNTER — Other Ambulatory Visit: Payer: Self-pay | Admitting: Internal Medicine

## 2015-09-13 ENCOUNTER — Ambulatory Visit (INDEPENDENT_AMBULATORY_CARE_PROVIDER_SITE_OTHER): Payer: Commercial Managed Care - HMO | Admitting: Internal Medicine

## 2015-09-13 ENCOUNTER — Encounter: Payer: Self-pay | Admitting: Internal Medicine

## 2015-09-13 VITALS — BP 136/74 | HR 64 | Temp 97.5°F | Resp 16 | Ht 67.0 in | Wt 180.6 lb

## 2015-09-13 DIAGNOSIS — E785 Hyperlipidemia, unspecified: Secondary | ICD-10-CM

## 2015-09-13 DIAGNOSIS — I10 Essential (primary) hypertension: Secondary | ICD-10-CM

## 2015-09-13 DIAGNOSIS — Z8673 Personal history of transient ischemic attack (TIA), and cerebral infarction without residual deficits: Secondary | ICD-10-CM

## 2015-09-13 DIAGNOSIS — R7303 Prediabetes: Secondary | ICD-10-CM | POA: Diagnosis not present

## 2015-09-13 DIAGNOSIS — Z136 Encounter for screening for cardiovascular disorders: Secondary | ICD-10-CM | POA: Diagnosis not present

## 2015-09-13 DIAGNOSIS — D518 Other vitamin B12 deficiency anemias: Secondary | ICD-10-CM | POA: Diagnosis not present

## 2015-09-13 DIAGNOSIS — Z125 Encounter for screening for malignant neoplasm of prostate: Secondary | ICD-10-CM

## 2015-09-13 DIAGNOSIS — D519 Vitamin B12 deficiency anemia, unspecified: Secondary | ICD-10-CM

## 2015-09-13 DIAGNOSIS — E559 Vitamin D deficiency, unspecified: Secondary | ICD-10-CM | POA: Diagnosis not present

## 2015-09-13 DIAGNOSIS — I429 Cardiomyopathy, unspecified: Secondary | ICD-10-CM

## 2015-09-13 DIAGNOSIS — Z79899 Other long term (current) drug therapy: Secondary | ICD-10-CM

## 2015-09-13 DIAGNOSIS — M1000 Idiopathic gout, unspecified site: Secondary | ICD-10-CM | POA: Diagnosis not present

## 2015-09-13 DIAGNOSIS — I5022 Chronic systolic (congestive) heart failure: Secondary | ICD-10-CM

## 2015-09-13 DIAGNOSIS — R7309 Other abnormal glucose: Secondary | ICD-10-CM | POA: Diagnosis not present

## 2015-09-13 DIAGNOSIS — Z1212 Encounter for screening for malignant neoplasm of rectum: Secondary | ICD-10-CM
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
## 2015-09-13 NOTE — Patient Instructions (Signed)

## 2015-09-14 LAB — CBC WITH DIFFERENTIAL/PLATELET
BASOS PCT: 0 %
Basophils Absolute: 0 cells/uL (ref 0–200)
Eosinophils Absolute: 380 cells/uL (ref 15–500)
Eosinophils Relative: 4 %
HEMATOCRIT: 41.1 % (ref 38.5–50.0)
HEMOGLOBIN: 13.8 g/dL (ref 13.2–17.1)
LYMPHS PCT: 24 %
Lymphs Abs: 2280 cells/uL (ref 850–3900)
MCH: 30.5 pg (ref 27.0–33.0)
MCHC: 33.6 g/dL (ref 32.0–36.0)
MCV: 90.9 fL (ref 80.0–100.0)
MPV: 10.2 fL (ref 7.5–12.5)
Monocytes Absolute: 1045 cells/uL — ABNORMAL HIGH (ref 200–950)
Monocytes Relative: 11 %
NEUTROS PCT: 61 %
Neutro Abs: 5795 cells/uL (ref 1500–7800)
Platelets: 168 10*3/uL (ref 140–400)
RBC: 4.52 MIL/uL (ref 4.20–5.80)
RDW: 14.2 % (ref 11.0–15.0)
WBC: 9.5 10*3/uL (ref 3.8–10.8)

## 2015-09-14 LAB — LIPID PANEL
CHOLESTEROL: 142 mg/dL (ref 125–200)
HDL: 44 mg/dL (ref >=40)
LDL Cholesterol: 70 mg/dL (ref <130)
Total CHOL/HDL Ratio: 3.2 Ratio (ref <=5.0)
Triglycerides: 138 mg/dL (ref <150)
VLDL: 28 mg/dL (ref <30)

## 2015-09-14 LAB — BASIC METABOLIC PANEL WITH GFR
BUN: 17 mg/dL (ref 7–25)
CALCIUM: 9.6 mg/dL (ref 8.6–10.3)
CO2: 25 mmol/L (ref 20–31)
CREATININE: 1.33 mg/dL — AB (ref 0.70–1.25)
Chloride: 105 mmol/L (ref 98–110)
GFR, Est African American: 63 mL/min (ref >=60)
GFR, Est Non African American: 55 mL/min — ABNORMAL LOW (ref >=60)
Glucose, Bld: 81 mg/dL (ref 65–99)
Potassium: 4.4 mmol/L (ref 3.5–5.3)
Sodium: 139 mmol/L (ref 135–146)

## 2015-09-14 LAB — HEPATIC FUNCTION PANEL
ALT: 11 U/L (ref 9–46)
AST: 15 U/L (ref 10–35)
Albumin: 4.2 g/dL (ref 3.6–5.1)
Alkaline Phosphatase: 72 U/L (ref 40–115)
Bilirubin, Direct: 0.1 mg/dL (ref <=0.2)
Indirect Bilirubin: 0.6 mg/dL (ref 0.2–1.2)
TOTAL PROTEIN: 7 g/dL (ref 6.1–8.1)
Total Bilirubin: 0.7 mg/dL (ref 0.2–1.2)

## 2015-09-14 LAB — HEMOGLOBIN A1C
HEMOGLOBIN A1C: 6.2 % — AB (ref <5.7)
MEAN PLASMA GLUCOSE: 131 mg/dL

## 2015-09-14 LAB — VITAMIN D 25 HYDROXY (VIT D DEFICIENCY, FRACTURES): VIT D 25 HYDROXY: 37 ng/mL (ref 30–100)

## 2015-09-14 LAB — PSA: PSA: 0.98 ng/mL (ref <=4.00)

## 2015-09-14 LAB — VITAMIN B12: Vitamin B-12: 955 pg/mL (ref 200–1100)

## 2015-09-14 LAB — MAGNESIUM: MAGNESIUM: 2.1 mg/dL (ref 1.5–2.5)

## 2015-09-14 LAB — INSULIN, RANDOM: Insulin: 5.1 u[IU]/mL (ref 2.0–19.6)

## 2015-09-14 LAB — TSH: TSH: 1.25 mIU/L (ref 0.40–4.50)

## 2015-09-15 ENCOUNTER — Other Ambulatory Visit: Payer: Self-pay | Admitting: Internal Medicine

## 2015-10-15 ENCOUNTER — Encounter: Payer: Self-pay | Admitting: Neurology

## 2015-12-09 ENCOUNTER — Other Ambulatory Visit: Payer: Self-pay | Admitting: Internal Medicine

## 2015-12-09 DIAGNOSIS — I10 Essential (primary) hypertension: Secondary | ICD-10-CM

## 2015-12-15 ENCOUNTER — Encounter: Payer: Self-pay | Admitting: Internal Medicine

## 2015-12-15 ENCOUNTER — Ambulatory Visit (INDEPENDENT_AMBULATORY_CARE_PROVIDER_SITE_OTHER): Payer: Commercial Managed Care - HMO | Admitting: Internal Medicine

## 2015-12-15 VITALS — BP 142/80 | HR 72 | Temp 97.5°F | Resp 16 | Ht 67.0 in | Wt 177.8 lb

## 2015-12-15 DIAGNOSIS — E785 Hyperlipidemia, unspecified: Secondary | ICD-10-CM | POA: Diagnosis not present

## 2015-12-15 DIAGNOSIS — F0390 Unspecified dementia without behavioral disturbance: Secondary | ICD-10-CM

## 2015-12-15 DIAGNOSIS — F03A0 Unspecified dementia, mild, without behavioral disturbance, psychotic disturbance, mood disturbance, and anxiety: Secondary | ICD-10-CM

## 2015-12-15 DIAGNOSIS — I10 Essential (primary) hypertension: Secondary | ICD-10-CM | POA: Diagnosis not present

## 2015-12-15 DIAGNOSIS — Z79899 Other long term (current) drug therapy: Secondary | ICD-10-CM

## 2015-12-15 DIAGNOSIS — R7303 Prediabetes: Secondary | ICD-10-CM

## 2015-12-15 LAB — CBC WITH DIFFERENTIAL/PLATELET
BASOS PCT: 0 %
Basophils Absolute: 0 cells/uL (ref 0–200)
EOS ABS: 202 {cells}/uL (ref 15–500)
Eosinophils Relative: 2 %
HEMATOCRIT: 43 % (ref 38.5–50.0)
Hemoglobin: 14.7 g/dL (ref 13.2–17.1)
LYMPHS ABS: 2020 {cells}/uL (ref 850–3900)
LYMPHS PCT: 20 %
MCH: 30.9 pg (ref 27.0–33.0)
MCHC: 34.2 g/dL (ref 32.0–36.0)
MCV: 90.3 fL (ref 80.0–100.0)
MONO ABS: 808 {cells}/uL (ref 200–950)
MPV: 10.4 fL (ref 7.5–12.5)
Monocytes Relative: 8 %
NEUTROS ABS: 7070 {cells}/uL (ref 1500–7800)
Neutrophils Relative %: 70 %
PLATELETS: 150 10*3/uL (ref 140–400)
RBC: 4.76 MIL/uL (ref 4.20–5.80)
RDW: 14.1 % (ref 11.0–15.0)
WBC: 10.1 10*3/uL (ref 3.8–10.8)

## 2015-12-15 MED ORDER — DONEPEZIL HCL 10 MG PO TABS: ORAL_TABLET | ORAL | 3 refills | Status: DC

## 2015-12-15 MED ORDER — FUROSEMIDE 40 MG PO TABS: 40.0000 mg | ORAL_TABLET | Freq: Every day | ORAL | 11 refills | Status: DC | PRN

## 2015-12-15 MED ORDER — PRAVASTATIN SODIUM 40 MG PO TABS: 40.0000 mg | ORAL_TABLET | Freq: Every day | ORAL | 0 refills | Status: DC

## 2015-12-15 MED ORDER — LISINOPRIL 30 MG PO TABS: 30.0000 mg | ORAL_TABLET | Freq: Every day | ORAL | 1 refills | Status: DC

## 2015-12-15 MED ORDER — RANITIDINE HCL 300 MG PO TABS: ORAL_TABLET | ORAL | 3 refills | Status: DC

## 2015-12-15 MED ORDER — SERTRALINE HCL 100 MG PO TABS: 100.0000 mg | ORAL_TABLET | Freq: Every day | ORAL | 1 refills | Status: DC

## 2015-12-15 MED ORDER — CARVEDILOL 25 MG PO TABS: 25.0000 mg | ORAL_TABLET | Freq: Two times a day (BID) | ORAL | 1 refills | Status: DC

## 2015-12-15 MED ORDER — ALPRAZOLAM 1 MG PO TABS: ORAL_TABLET | ORAL | 0 refills | Status: DC

## 2015-12-15 MED ORDER — AMLODIPINE BESYLATE 10 MG PO TABS
ORAL_TABLET | ORAL | 5 refills | Status: DC
Start: 2015-12-15 — End: 2016-04-05

## 2015-12-15 NOTE — Progress Notes (Signed)
Assessment and Plan:  Hypertension:  -Continue medication,  -monitor blood pressure at home.  -Continue DASH diet.   -Reminder to go to the ER if any CP, SOB, nausea, dizziness, severe HA, changes vision/speech, left arm numbness and tingling, and jaw pain.  Cholesterol: -Continue diet and exercise.  -Check cholesterol.   Pre-diabetes: -Continue diet and exercise.  -Check A1C  Vitamin D Def: -check level -continue medications.   Dementia -cont donepazil  Continue diet and meds as discussed. Further disposition pending results of labs.  HPI 70 y.o. male  presents for 3 month follow up with hypertension, hyperlipidemia, prediabetes and vitamin D.   His blood pressure has been controlled at home, today their BP is BP: (!) 142/80.   He does not workout. He denies chest pain, shortness of breath, dizziness.   He is on cholesterol medication and denies myalgias. His cholesterol is at goal. The cholesterol last visit was:   Lab Results  Component Value Date   CHOL 142 09/13/2015   HDL 44 09/13/2015   LDLCALC 70 09/13/2015   TRIG 138 09/13/2015   CHOLHDL 3.2 09/13/2015     He has been working on diet and exercise for prediabetes, and denies foot ulcerations, hyperglycemia, hypoglycemia , increased appetite, nausea, paresthesia of the feet, polydipsia, polyuria, visual disturbances, vomiting and weight loss. Last A1C in the office was:  Lab Results  Component Value Date   HGBA1C 6.2 (H) 09/13/2015  He reports that he is eating a lot of greens.  He is eating well.    Patient is on Vitamin D supplement.  Lab Results  Component Value Date   VD25OH 37 09/13/2015     He reports that he is continuing to work on his exercises for his physical therapy.  He is doing well.  He has not fallen since March.  He is walking without a walker.  He reports that he has an alarm he wears around his neck when he walks.    Dementia remains stable per his and his family report.  He is still  taking donepazil.    Current Medications:  Current Outpatient Prescriptions on File Prior to Visit  Medication Sig Dispense Refill  . ALPRAZolam (XANAX) 1 MG tablet take 1/2-1 tablet by mouth three times a day if needed for anxiety (Patient not taking: Reported on 12/15/2015) 90 tablet 0  . amLODipine (NORVASC) 10 MG tablet TAKE 1/2 TO 1 TABLET BY MOUTH ONCE DAILY FO4R BLOOD PRESSURE (Patient not taking: Reported on 12/15/2015) 30 tablet 5  . aspirin 81 MG tablet Take 81 mg by mouth daily.    . carvedilol (COREG) 25 MG tablet take 1 tablet by mouth twice a day (Patient not taking: Reported on 12/15/2015) 180 tablet 1  . Cholecalciferol (VITAMIN D PO) Take 5,000 Int'l Units by mouth daily. Reported on 06/09/2015    . donepezil (ARICEPT) 10 MG tablet Take 1/2 tablet daily for 1 month, then increase to 1 tablet daily and continue (Patient not taking: Reported on 12/15/2015) 90 tablet 3  . furosemide (LASIX) 40 MG tablet Take 1 tablet (40 mg total) by mouth daily as needed for fluid or edema. (Patient not taking: Reported on 12/15/2015) 30 tablet 11  . lisinopril (PRINIVIL,ZESTRIL) 30 MG tablet take 1 tablet by mouth once daily (Patient not taking: Reported on 12/15/2015) 90 tablet 1  . pravastatin (PRAVACHOL) 40 MG tablet take 1 tablet by mouth at bedtime (Patient not taking: Reported on 12/15/2015) 90 tablet 0  . ranitidine (  ZANTAC) 300 MG tablet Take twice a day for 2 weeks, then can go to once at night (Patient not taking: Reported on 12/15/2015) 60 tablet 3  . sertraline (ZOLOFT) 100 MG tablet take 1 tablet by mouth once daily (Patient not taking: Reported on 12/15/2015) 90 tablet 1   No current facility-administered medications on file prior to visit.     Medical History:  Past Medical History:  Diagnosis Date  . CAD (coronary artery disease), native coronary artery    Coronary calcifications noted on CT scan.   . Cardiomyopathy of undetermined type (HCC)   . Chronic systolic heart failure (HCC)    Echo  2015 EF 30-35%   . CVA (cerebral vascular accident) [facility]) June 2013  . Embedded metal fragments    Metallic Pellet In Heart  . Gout   . History of ischemic vertebrobasilar artery cerebellar stroke   . Hyperlipemia   . Hyperlipidemia   . Hypertensive heart disease   . Illiteracy   . Pre-diabetes   . Vitamin D deficiency     Allergies: No Known Allergies   Review of Systems:  Review of Systems  Constitutional: Negative for chills, fever and malaise/fatigue.  HENT: Negative for congestion, ear pain and sore throat.   Eyes: Negative.   Respiratory: Negative for cough, shortness of breath and wheezing.   Cardiovascular: Negative for chest pain, palpitations and leg swelling.  Gastrointestinal: Negative for abdominal pain, blood in stool, constipation, diarrhea, heartburn and melena.  Genitourinary: Negative.   Skin: Negative.   Neurological: Negative for dizziness, sensory change, loss of consciousness and headaches.  Psychiatric/Behavioral: Negative for depression. The patient is not nervous/anxious and does not have insomnia.     Family history- Review and unchanged  Social history- Review and unchanged  Physical Exam: BP (!) 142/80   Pulse 72   Temp 97.5 F (36.4 C)   Resp 16   Ht 5\' 7"  (1.702 m)   Wt 177 lb 12.8 oz (80.6 kg)   BMI 27.85 kg/m  Wt Readings from Last 3 Encounters:  12/15/15 177 lb 12.8 oz (80.6 kg)  09/13/15 180 lb 9.6 oz (81.9 kg)  07/16/15 175 lb (79.4 kg)    General Appearance: Well nourished well developed, in no apparent distress. Eyes: PERRLA, EOMs, conjunctiva no swelling or erythema ENT/Mouth: Ear canals normal without obstruction, swelling, erythma, discharge.  TMs normal bilaterally.  Oropharynx moist, clear, without exudate, or postoropharyngeal swelling. Neck: Supple, thyroid normal,no cervical adenopathy  Respiratory: Respiratory effort normal, Breath sounds clear A&P without rhonchi, wheeze, or rale.  No retractions, no accessory  usage. Cardio: RRR with no MRGs. Brisk peripheral pulses without edema.  Abdomen: Soft, + BS,  Non tender, no guarding, rebound, hernias, masses. Musculoskeletal: Full ROM, 5/5 strength, Shuffling gait Skin: Warm, dry without rashes, lesions, ecchymosis.  Neuro: Awake and oriented X 3, Cranial nerves intact. Normal muscle tone, no cerebellar symptoms. Psych: Normal affect, Insight and Judgment appropriate.    [name], PA-C 2:49 PM [facility]

## 2015-12-16 ENCOUNTER — Other Ambulatory Visit: Payer: Self-pay | Admitting: Internal Medicine

## 2015-12-16 LAB — HEMOGLOBIN A1C
Hgb A1c MFr Bld: 5.9 % — ABNORMAL HIGH (ref <5.7)
Mean Plasma Glucose: 123 mg/dL

## 2015-12-16 LAB — LIPID PANEL
CHOL/HDL RATIO: 2.8 ratio (ref <=5.0)
CHOLESTEROL: 156 mg/dL (ref 125–200)
HDL: 56 mg/dL (ref >=40)
LDL Cholesterol: 73 mg/dL (ref <130)
TRIGLYCERIDES: 135 mg/dL (ref <150)
VLDL: 27 mg/dL (ref <30)

## 2015-12-16 LAB — HEPATIC FUNCTION PANEL
ALT: 11 U/L (ref 9–46)
AST: 16 U/L (ref 10–35)
Albumin: 4.2 g/dL (ref 3.6–5.1)
Alkaline Phosphatase: 73 U/L (ref 40–115)
BILIRUBIN DIRECT: 0.1 mg/dL (ref <=0.2)
BILIRUBIN TOTAL: 0.6 mg/dL (ref 0.2–1.2)
Indirect Bilirubin: 0.5 mg/dL (ref 0.2–1.2)
Total Protein: 7.2 g/dL (ref 6.1–8.1)

## 2015-12-16 LAB — BASIC METABOLIC PANEL WITH GFR
BUN: 17 mg/dL (ref 7–25)
CALCIUM: 9.9 mg/dL (ref 8.6–10.3)
CO2: 24 mmol/L (ref 20–31)
CREATININE: 1.32 mg/dL — AB (ref 0.70–1.25)
Chloride: 106 mmol/L (ref 98–110)
GFR, Est African American: 64 mL/min (ref >=60)
GFR, Est Non African American: 55 mL/min — ABNORMAL LOW (ref >=60)
Glucose, Bld: 84 mg/dL (ref 65–99)
Potassium: 4.3 mmol/L (ref 3.5–5.3)
SODIUM: 140 mmol/L (ref 135–146)

## 2015-12-16 LAB — TSH: TSH: 1.35 m[IU]/L (ref 0.40–4.50)

## 2016-01-01 ENCOUNTER — Emergency Department (HOSPITAL_COMMUNITY): Payer: Commercial Managed Care - HMO

## 2016-01-01 ENCOUNTER — Inpatient Hospital Stay (HOSPITAL_COMMUNITY)
Admission: EM | Admit: 2016-01-01 | Discharge: 2016-01-05 | DRG: 176 | Disposition: A | Discharge status: Home / Self Care | Payer: Commercial Managed Care - HMO | Attending: Internal Medicine | Admitting: Internal Medicine

## 2016-01-01 ENCOUNTER — Encounter (HOSPITAL_COMMUNITY): Payer: Self-pay | Admitting: Emergency Medicine

## 2016-01-01 DIAGNOSIS — F03A0 Unspecified dementia, mild, without behavioral disturbance, psychotic disturbance, mood disturbance, and anxiety: Secondary | ICD-10-CM | POA: Diagnosis present

## 2016-01-01 DIAGNOSIS — R7303 Prediabetes: Secondary | ICD-10-CM | POA: Diagnosis present

## 2016-01-01 DIAGNOSIS — N401 Enlarged prostate with lower urinary tract symptoms: Secondary | ICD-10-CM | POA: Diagnosis present

## 2016-01-01 DIAGNOSIS — I080 Rheumatic disorders of both mitral and aortic valves: Secondary | ICD-10-CM | POA: Diagnosis present

## 2016-01-01 DIAGNOSIS — J9811 Atelectasis: Secondary | ICD-10-CM | POA: Diagnosis not present

## 2016-01-01 DIAGNOSIS — R338 Other retention of urine: Secondary | ICD-10-CM | POA: Diagnosis present

## 2016-01-01 DIAGNOSIS — D72829 Elevated white blood cell count, unspecified: Secondary | ICD-10-CM | POA: Diagnosis not present

## 2016-01-01 DIAGNOSIS — Z8673 Personal history of transient ischemic attack (TIA), and cerebral infarction without residual deficits: Secondary | ICD-10-CM

## 2016-01-01 DIAGNOSIS — Z8249 Family history of ischemic heart disease and other diseases of the circulatory system: Secondary | ICD-10-CM

## 2016-01-01 DIAGNOSIS — I5022 Chronic systolic (congestive) heart failure: Secondary | ICD-10-CM | POA: Diagnosis present

## 2016-01-01 DIAGNOSIS — I82431 Acute embolism and thrombosis of right popliteal vein: Secondary | ICD-10-CM | POA: Diagnosis not present

## 2016-01-01 DIAGNOSIS — Z833 Family history of diabetes mellitus: Secondary | ICD-10-CM | POA: Diagnosis not present

## 2016-01-01 DIAGNOSIS — I10 Essential (primary) hypertension: Secondary | ICD-10-CM | POA: Diagnosis present

## 2016-01-01 DIAGNOSIS — Z823 Family history of stroke: Secondary | ICD-10-CM

## 2016-01-01 DIAGNOSIS — Z7982 Long term (current) use of aspirin: Secondary | ICD-10-CM | POA: Diagnosis not present

## 2016-01-01 DIAGNOSIS — I272 Other secondary pulmonary hypertension: Secondary | ICD-10-CM | POA: Diagnosis present

## 2016-01-01 DIAGNOSIS — M545 Low back pain: Secondary | ICD-10-CM | POA: Diagnosis present

## 2016-01-01 DIAGNOSIS — N400 Enlarged prostate without lower urinary tract symptoms: Secondary | ICD-10-CM

## 2016-01-01 DIAGNOSIS — M109 Gout, unspecified: Secondary | ICD-10-CM | POA: Diagnosis present

## 2016-01-01 DIAGNOSIS — I429 Cardiomyopathy, unspecified: Secondary | ICD-10-CM | POA: Diagnosis present

## 2016-01-01 DIAGNOSIS — R079 Chest pain, unspecified: Secondary | ICD-10-CM | POA: Diagnosis present

## 2016-01-01 DIAGNOSIS — R339 Retention of urine, unspecified: Secondary | ICD-10-CM | POA: Diagnosis not present

## 2016-01-01 DIAGNOSIS — F0390 Unspecified dementia without behavioral disturbance: Secondary | ICD-10-CM | POA: Diagnosis not present

## 2016-01-01 DIAGNOSIS — I25119 Atherosclerotic heart disease of native coronary artery with unspecified angina pectoris: Secondary | ICD-10-CM | POA: Diagnosis not present

## 2016-01-01 DIAGNOSIS — I2699 Other pulmonary embolism without acute cor pulmonale: Secondary | ICD-10-CM

## 2016-01-01 DIAGNOSIS — I712 Thoracic aortic aneurysm, without rupture: Secondary | ICD-10-CM | POA: Diagnosis present

## 2016-01-01 DIAGNOSIS — I82412 Acute embolism and thrombosis of left femoral vein: Secondary | ICD-10-CM | POA: Diagnosis not present

## 2016-01-01 DIAGNOSIS — I2510 Atherosclerotic heart disease of native coronary artery without angina pectoris: Secondary | ICD-10-CM | POA: Diagnosis present

## 2016-01-01 DIAGNOSIS — E785 Hyperlipidemia, unspecified: Secondary | ICD-10-CM | POA: Diagnosis present

## 2016-01-01 DIAGNOSIS — I319 Disease of pericardium, unspecified: Secondary | ICD-10-CM | POA: Diagnosis not present

## 2016-01-01 DIAGNOSIS — I110 Hypertensive heart disease with heart failure: Secondary | ICD-10-CM | POA: Diagnosis present

## 2016-01-01 DIAGNOSIS — E782 Mixed hyperlipidemia: Secondary | ICD-10-CM | POA: Diagnosis present

## 2016-01-01 HISTORY — DX: Other pulmonary embolism without acute cor pulmonale: I26.99

## 2016-01-01 LAB — BASIC METABOLIC PANEL
Anion gap: 5 (ref 5–15)
BUN: 14 mg/dL (ref 6–20)
CO2: 25 mmol/L (ref 22–32)
Calcium: 9.6 mg/dL (ref 8.9–10.3)
Chloride: 107 mmol/L (ref 101–111)
Creatinine, Ser: 1.34 mg/dL — ABNORMAL HIGH (ref 0.61–1.24)
GFR calc Af Amer: >60 mL/min (ref >60)
GFR calc non Af Amer: 53 mL/min — ABNORMAL LOW (ref >60)
Glucose, Bld: 147 mg/dL — ABNORMAL HIGH (ref 65–99)
Potassium: 4.7 mmol/L (ref 3.5–5.1)
Sodium: 137 mmol/L (ref 135–145)

## 2016-01-01 LAB — CBC
HCT: 41.8 % (ref 39.0–52.0)
Hemoglobin: 14.1 g/dL (ref 13.0–17.0)
MCH: 31.1 pg (ref 26.0–34.0)
MCHC: 33.7 g/dL (ref 30.0–36.0)
MCV: 92.3 fL (ref 78.0–100.0)
Platelets: 161 10*3/uL (ref 150–400)
RBC: 4.53 MIL/uL (ref 4.22–5.81)
RDW: 12.9 % (ref 11.5–15.5)
WBC: 14.6 10*3/uL — ABNORMAL HIGH (ref 4.0–10.5)

## 2016-01-01 LAB — HEPATIC FUNCTION PANEL
ALT: 15 U/L — ABNORMAL LOW (ref 17–63)
AST: 17 U/L (ref 15–41)
Albumin: 3.4 g/dL — ABNORMAL LOW (ref 3.5–5.0)
Alkaline Phosphatase: 69 U/L (ref 38–126)
Bilirubin, Direct: 0.2 mg/dL (ref 0.1–0.5)
Indirect Bilirubin: 0.9 mg/dL (ref 0.3–0.9)
Total Bilirubin: 1.1 mg/dL (ref 0.3–1.2)
Total Protein: 6.8 g/dL (ref 6.5–8.1)

## 2016-01-01 LAB — I-STAT TROPONIN, ED: Troponin i, poc: 0 ng/mL (ref 0.00–0.08)

## 2016-01-01 LAB — LIPASE, BLOOD: Lipase: 26 U/L (ref 11–51)

## 2016-01-01 MED ORDER — SODIUM CHLORIDE 0.9 % IV BOLUS (SEPSIS)
1000.0000 mL | Freq: Once | INTRAVENOUS | Status: AC
  Administered 2016-01-01: 1000 mL via INTRAVENOUS

## 2016-01-01 MED ORDER — ENOXAPARIN SODIUM 80 MG/0.8ML ~~LOC~~ SOLN
80.0000 mg | Freq: Two times a day (BID) | SUBCUTANEOUS | Status: DC
  Administered 2016-01-02: 80 mg via SUBCUTANEOUS
  Filled 2016-01-01 (×2): qty 0.8

## 2016-01-01 MED ORDER — IOPAMIDOL (ISOVUE-370) INJECTION 76%
INTRAVENOUS | Status: AC
  Administered 2016-01-01: 100 mL
  Filled 2016-01-01: qty 100

## 2016-01-01 MED ORDER — HYDROMORPHONE HCL 1 MG/ML IJ SOLN
1.0000 mg | Freq: Once | INTRAMUSCULAR | Status: AC
  Administered 2016-01-01: 1 mg via INTRAVENOUS
  Filled 2016-01-01: qty 1

## 2016-01-01 MED ORDER — ALPRAZOLAM 0.5 MG PO TABS: 1.0000 mg | ORAL_TABLET | Freq: Every evening | ORAL | Status: DC | PRN

## 2016-01-01 MED ORDER — LISINOPRIL 20 MG PO TABS
30.0000 mg | ORAL_TABLET | Freq: Every day | ORAL | Status: DC
  Administered 2016-01-01 – 2016-01-05 (×5): 30 mg via ORAL
  Filled 2016-01-01 (×5): qty 1

## 2016-01-01 MED ORDER — AMLODIPINE BESYLATE 10 MG PO TABS
10.0000 mg | ORAL_TABLET | Freq: Every day | ORAL | Status: DC
  Administered 2016-01-01 – 2016-01-05 (×5): 10 mg via ORAL
  Filled 2016-01-01 (×5): qty 1

## 2016-01-01 MED ORDER — VITAMIN D 1000 UNITS PO TABS
2000.0000 [IU] | ORAL_TABLET | Freq: Every day | ORAL | Status: DC
  Administered 2016-01-02 – 2016-01-05 (×4): 2000 [IU] via ORAL
  Filled 2016-01-01 (×5): qty 2

## 2016-01-01 MED ORDER — ENOXAPARIN SODIUM 80 MG/0.8ML ~~LOC~~ SOLN
80.0000 mg | Freq: Once | SUBCUTANEOUS | Status: AC
  Administered 2016-01-01: 80 mg via SUBCUTANEOUS
  Filled 2016-01-01: qty 0.8

## 2016-01-01 MED ORDER — ALPRAZOLAM 0.5 MG PO TABS: 0.5000 mg | ORAL_TABLET | Freq: Three times a day (TID) | ORAL | Status: DC | PRN

## 2016-01-01 MED ORDER — PRAVASTATIN SODIUM 40 MG PO TABS
40.0000 mg | ORAL_TABLET | Freq: Every day | ORAL | Status: DC
  Administered 2016-01-01 – 2016-01-05 (×5): 40 mg via ORAL
  Filled 2016-01-01 (×5): qty 1

## 2016-01-01 MED ORDER — SERTRALINE HCL 100 MG PO TABS
100.0000 mg | ORAL_TABLET | Freq: Every day | ORAL | Status: DC
  Administered 2016-01-01 – 2016-01-05 (×5): 100 mg via ORAL
  Filled 2016-01-01 (×5): qty 1

## 2016-01-01 MED ORDER — FAMOTIDINE IN NACL 20-0.9 MG/50ML-% IV SOLN
20.0000 mg | Freq: Once | INTRAVENOUS | Status: AC
  Administered 2016-01-01: 20 mg via INTRAVENOUS
  Filled 2016-01-01: qty 50

## 2016-01-01 MED ORDER — CARVEDILOL 25 MG PO TABS
25.0000 mg | ORAL_TABLET | Freq: Two times a day (BID) | ORAL | Status: DC
  Administered 2016-01-01 – 2016-01-05 (×7): 25 mg via ORAL
  Filled 2016-01-01 (×7): qty 1

## 2016-01-01 MED ORDER — DONEPEZIL HCL 10 MG PO TABS
10.0000 mg | ORAL_TABLET | Freq: Every day | ORAL | Status: DC
  Administered 2016-01-01 – 2016-01-05 (×5): 10 mg via ORAL
  Filled 2016-01-01 (×5): qty 1

## 2016-01-01 MED ORDER — ASPIRIN EC 81 MG PO TBEC
81.0000 mg | DELAYED_RELEASE_TABLET | Freq: Every day | ORAL | Status: DC
  Administered 2016-01-01 – 2016-01-05 (×5): 81 mg via ORAL
  Filled 2016-01-01 (×5): qty 1

## 2016-01-01 MED ORDER — FAMOTIDINE 20 MG PO TABS
20.0000 mg | ORAL_TABLET | Freq: Two times a day (BID) | ORAL | Status: DC
  Administered 2016-01-01 – 2016-01-05 (×8): 20 mg via ORAL
  Filled 2016-01-01 (×9): qty 1

## 2016-01-01 MED ORDER — FUROSEMIDE 40 MG PO TABS
40.0000 mg | ORAL_TABLET | ORAL | Status: DC
  Administered 2016-01-02 – 2016-01-04 (×2): 40 mg via ORAL
  Filled 2016-01-01 (×3): qty 1

## 2016-01-01 NOTE — Progress Notes (Signed)
ANTICOAGULATION CONSULT NOTE - Initial Consult  Pharmacy Consult for lovenox Indication: pulmonary embolus  No Known Allergies  Patient Measurements: Height: 5\' 7"  (170.2 cm) Weight: 172 lb 14.4 oz (78.4 kg) IBW/kg (Calculated) : 66.1  Vital Signs: Temp: 98.2 F (36.8 C) (08/19 1715) Temp Source: Oral (08/19 1715) BP: 159/81 (08/19 1715) Pulse Rate: 79 (08/19 1715)  Labs:  Recent Labs  01/01/16 0907  HGB 14.1  HCT 41.8  PLT 161  CREATININE 1.34*    Estimated Creatinine Clearance: 50 mL/min (by C-G formula based on SCr of 1.34 mg/dL).   Medical History: Past Medical History:  Diagnosis Date  . CAD (coronary artery disease), native coronary artery    Coronary calcifications noted on CT scan.   . Cardiomyopathy of undetermined type (HCC)   . Chronic systolic heart failure (HCC)    Echo 2015 EF 30-35%   . CVA (cerebral vascular accident) [facility](HCC) June 2013  . Embedded metal fragments    Metallic Pellet In Heart  . Gout   . History of ischemic vertebrobasilar artery cerebellar stroke   . Hyperlipemia   . Hyperlipidemia   . Hypertension   . Hypertensive heart disease   . Illiteracy   . Pre-diabetes   . Vitamin D deficiency    Assessment: [age] yom presented to the ED with back pain and SOB. Found to have bilateral PE. Given a dose of lovenox and pharmacy to continue dosing lovenox. Baseline H/H and platelets are WNL. SCr is mildly elevated at 1.34.   Goal of Therapy:  Anti-Xa level 0.6-1 units/ml 4hrs after LMWH dose given Monitor platelets by anticoagulation protocol: Yes   Plan:  - Lovenox 80mg  SQ Q12H - CBC Q72H - Monitor renal fxn, S&S of bleeding  Lakresha Stifter, Drake Leachachel Lynn 01/01/2016,5:19 PM

## 2016-01-01 NOTE — ED Notes (Signed)
Called CT stated will be send for patient shortly.

## 2016-01-01 NOTE — ED Notes (Signed)
Pt spoke to Dr. Juleen ChinaKohut about giving lovenox instead of heparin.

## 2016-01-01 NOTE — ED Triage Notes (Signed)
Pt c/o back pain onset yesterday. Pt daughter gave him a pain pill for the back pain, pt unsure what the name is. Today pt c/o left sided chest pain when he breathes.
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
## 2016-01-01 NOTE — ED Notes (Signed)
Called CT patient on list for CT will be completing CT after approximately 2 patient in front of patient.

## 2016-01-01 NOTE — ED Notes (Signed)
Patient went to xray from triage and will be transported to room after xray.

## 2016-01-01 NOTE — Progress Notes (Signed)
Patient arrived in the unit accompanied by NT via stretcher. Orientation to the unit given. Patient verbalizes understanding. 

## 2016-01-02 ENCOUNTER — Inpatient Hospital Stay (HOSPITAL_COMMUNITY): Payer: Commercial Managed Care - HMO

## 2016-01-02 DIAGNOSIS — I2699 Other pulmonary embolism without acute cor pulmonale: Principal | ICD-10-CM

## 2016-01-02 DIAGNOSIS — I319 Disease of pericardium, unspecified: Secondary | ICD-10-CM

## 2016-01-02 LAB — CBC
HCT: 36.5 % — ABNORMAL LOW (ref 39.0–52.0)
Hemoglobin: 12.2 g/dL — ABNORMAL LOW (ref 13.0–17.0)
MCH: 30.4 pg (ref 26.0–34.0)
MCHC: 33.4 g/dL (ref 30.0–36.0)
MCV: 91 fL (ref 78.0–100.0)
Platelets: 130 10*3/uL — ABNORMAL LOW (ref 150–400)
RBC: 4.01 MIL/uL — ABNORMAL LOW (ref 4.22–5.81)
RDW: 12.6 % (ref 11.5–15.5)
WBC: 11.9 10*3/uL — ABNORMAL HIGH (ref 4.0–10.5)

## 2016-01-02 LAB — BASIC METABOLIC PANEL
Anion gap: 9 (ref 5–15)
BUN: 10 mg/dL (ref 6–20)
CO2: 24 mmol/L (ref 22–32)
Calcium: 9.3 mg/dL (ref 8.9–10.3)
Chloride: 105 mmol/L (ref 101–111)
Creatinine, Ser: 1.2 mg/dL (ref 0.61–1.24)
GFR calc Af Amer: >60 mL/min (ref >60)
GFR calc non Af Amer: >60 mL/min (ref >60)
Glucose, Bld: 143 mg/dL — ABNORMAL HIGH (ref 65–99)
Potassium: 4 mmol/L (ref 3.5–5.1)
Sodium: 138 mmol/L (ref 135–145)

## 2016-01-02 LAB — ECHOCARDIOGRAM COMPLETE
Height: 67 in
Weight: 2656 oz

## 2016-01-02 LAB — HEPARIN LEVEL (UNFRACTIONATED): Heparin Unfractionated: 0.64 IU/mL (ref 0.30–0.70)

## 2016-01-02 MED ORDER — HEPARIN (PORCINE) IN NACL 100-0.45 UNIT/ML-% IJ SOLN
1350.0000 [IU]/h | INTRAMUSCULAR | Status: DC
  Administered 2016-01-02 – 2016-01-04 (×3): 1350 [IU]/h via INTRAVENOUS
  Filled 2016-01-02 (×3): qty 250

## 2016-01-02 NOTE — Progress Notes (Signed)
Echocardiogram 2D Echocardiogram has been performed.  Dorothey BasemanReel, Amarion M 01/02/2016, 11:29 AM

## 2016-01-02 NOTE — Progress Notes (Signed)
Patient positive for DVT to bilateral lower extremities . Dr. Isidoro Donningai notified.

## 2016-01-02 NOTE — Progress Notes (Signed)
*  PRELIMINARY RESULTS* Vascular Ultrasound Lower extremity venous duplex has been completed.  Preliminary findings:  Superficial thrombosis noted in the left GSV, in it's entirety, extending up to saphenofemoral junction - partially into common femoral vein (DVT). Non-occlusive DVT also noted in the right popliteal vein, left distal femoral vein, and left popliteal vein.   Called results to Gerberristina, RN    Farrel DemarkJill Eunice, RDMS, RVT  01/02/2016, 11:23 AM

## 2016-01-02 NOTE — Progress Notes (Signed)
ANTICOAGULATION CONSULT NOTE - Follow Up Consult  Pharmacy Consult for Heparin  Indication: pulmonary embolus and DVT  No Known Allergies  Patient Measurements: Height: 5\' 7"  (170.2 cm) Weight: 166 lb (75.3 kg) IBW/kg (Calculated) : 66.1  Vital Signs: Temp: 98.6 F (37 C) (08/20 2006) Temp Source: Oral (08/20 2006) BP: 121/75 (08/20 2006) Pulse Rate: 55 (08/20 2006)  Labs:  Recent Labs  01/01/16 0907 01/02/16 0255 01/02/16 2326  HGB 14.1 12.2*  --   HCT 41.8 36.5*  --   PLT 161 130*  --   HEPARINUNFRC  --   --  0.64  CREATININE 1.34* 1.20  --     Estimated Creatinine Clearance: 55.8 mL/min (by C-G formula based on SCr of 1.2 mg/dL).   Assessment: Heparin for new onset DVT/PE, initial heparin level is therapeutic at 0.64  Goal of Therapy:  Heparin level 0.3-0.7 units/ml Monitor platelets by anticoagulation protocol: Yes   Plan:  -Cont heparin at 1350 units/hr -Confirmatory HL with AM labs  Abran DukeLedford, Treon 01/02/2016,11:57 PM

## 2016-01-02 NOTE — Progress Notes (Addendum)
[facility]                                                                              Patient Demographics  Dennis [name], is a 70 y.o. male, DOB - [birth year], ZOX:[id number]  Admit date - 01/01/2016   Admitting Physician Barnetta Chapel, MD  Outpatient Primary MD for the patient is [name], MD  Outpatient specialists:   LOS - 1  days    Chief Complaint  Patient presents with  . Chest Pain  . Back Pain       Brief summary   70 year old male with history of dementia, CHF with EF of 30-35%, pulmonary HTN, CAD, CVA, HTN and HL. Patient presents with back pain and SOB that started last night. No history of recent surgery or travel, but patient is said to be largely sedentary at home. CTA of Chest done presentation revealed Bilateral pulmonary embolism, worse on the left. 4.5 CM ascending Thoracic aortic aneurysm was also noted on CTA Chest. EKG revealed poor progression of R wave. Mild leukocytosis is noted, with compression atelectasis and mild pleural effusion (Likely secondary to the PE). No headache, no neck pain, no fever or chills, no GI symptoms or urinary symptoms   Assessment & Plan   Acute bilateral pulmonary embolism - Currently no chest pain or shortness of breath -CT angiogram of the chest showed bilateral lower lobe pulmonary emboli more significantly involving the left lower lobe - Currently on therapeutic Lovenox. Discussed in detail with the patient regarding Lovenox/Coumadin versus NOAC's. He is agreeable to NOAC's, will place case management consult for insurance co-pays. -Follow Doppler ultrasound of the lower extremity, 2-D echo   Addendum: Doppler US LE: b/l DVT, change to IV heparin, d/w pharmacy     Ascending thoracic aortic aneurysm - CTA of the chest also showed incidental finding of ascending thoracic aortic aneurysm, 4.5 cm. Recommended semiannual/6 months imaging followed up by CT angiogram or MRA and referred to  cardiothoracic surgery. Will recommend CT surgery outpatient appointment at discharge.  Dementia - Currently stable without acute issues, continue Zoloft, Aricept   Coronary  artery diseaseWith pulmonary hypertension - Currently no chest pain. 2-D echo from 2013 had shown EF of 30-35%, elevated PA pressure 74  - Repeat 2-D echocardiogram  - Continue aspirin, Coreg, lisinopril, statin   Hypertension - Currently stable  - Continue Coreg, lisinopril, amlodipine   Hyperlipidemia - Continue statin    Code Status: full DVT Prophylaxis:  Lovenox Family Communication: Discussed in detail with the patient, all imaging results, lab results explained to the patient . PT evaluation tomorrow    Disposition Plan:   Time Spent in minutes  25 minutes  Procedures:  CTA chest  Consultants :   None   Antimicrobials:      Medications  Scheduled Meds: . amLODipine  10 mg Oral Daily  . aspirin EC  81 mg Oral Daily  . carvedilol  25 mg Oral BID  . cholecalciferol  2,000 Units Oral Daily  . donepezil  10 mg Oral Daily  . enoxaparin (LOVENOX) injection  80 mg Subcutaneous Q12H  . famotidine  20 mg Oral BID  . furosemide  40 mg Oral QODAY  . lisinopril  30 mg Oral Daily  . pravastatin  40 mg Oral Daily  . sertraline  100 mg Oral Daily   Continuous Infusions:  PRN Meds:.ALPRAZolam   Antibiotics   Anti-infectives    None        Subjective:   Dennis [name] was seen and examined today.  No complaints at this time.  Patient denies dizziness, chest pain, shortness of breath, abdominal pain, N/V/D/C, new weakness, numbess, tingling. No acute events overnight.    Objective:   Vitals:   01/01/16 2055 01/02/16 0022 01/02/16 0427 01/02/16 0957  BP: (!) 141/80 113/72 (!) 141/88 133/72  Pulse: 88 77 82 78  Resp: 18 18 18 18   Temp: 99.2 F (37.3 C) 99.3 F (37.4 C) 99.3 F (37.4 C)   TempSrc: Oral Oral Oral   SpO2: 96% 93% 96% 94%  Weight:   75.3 kg (166 lb)   Height:         Intake/Output Summary (Last 24 hours) at 01/02/16 1101 Last data filed at 01/02/16 0900  Gross per 24 hour  Intake              460 ml  Output              400 ml  Net               60 ml     Wt Readings from Last 3 Encounters:  01/02/16 75.3 kg (166 lb)  12/15/15 80.6 kg (177 lb 12.8 oz)  09/13/15 81.9 kg (180 lb 9.6 oz)     Exam  General: Alert and oriented x 3, NAD  HEENT:  PERRLA, EOMI, Anicteric Sclera, mucous membranes moist.   Neck: Supple, no JVD, no masses  Cardiovascular: S1 S2 auscultated, no rubs, murmurs or gallops. Regular rate and rhythm.  Respiratory: Clear to auscultation bilaterally, no wheezing, rales or rhonchi  Gastrointestinal: Soft, nontender, nondistended, + bowel sounds  Ext: no cyanosis clubbing or edema  Neuro: AAOx3, Cr N's II- XII. Strength 5/5 upper and lower extremities bilaterally  Skin: No rashes  Psych: Normal affect and demeanor, alert and oriented x3    Data Reviewed:  I have personally reviewed following labs and imaging studies  Micro Results No results found for this or any previous visit (from the past 240 hour(s)).  Radiology Reports Dg Chest 2 View  Result Date: 01/01/2016 CLINICAL DATA:  Pt states hx enlarged heart. Pt c/o mid/center (around T-7) back pain off and on, worsening yesterday and today. Pt states the pain is worse when breathing; he took a pain pill yesterday and the pain did not subside. Pt is treated for HTN; non-smoker. EXAM: CHEST  2 VIEW COMPARISON:  05/01/2014 CT.  10/17/2011 chest radiograph. FINDINGS: Midline trachea. Mild cardiomegaly. Tortuous thoracic aorta. Metallic foreign object again projects within the anterior left thoracic cavity. No pleural effusion or pneumothorax. Patchy left base airspace disease is new. Multiple right-sided rib fractures, present on 05/01/2014. IMPRESSION: New left base airspace disease. Considerations include infection, atelectasis, or in the appropriate clinical  setting, left-sided pulmonary embolism. Electronically Signed   By: Jeronimo GreavesKyle  Talbot M.D.   On: 01/01/2016 09:54   Ct Angio Chest Pe W And/or Wo Contrast  Result Date: 01/01/2016 CLINICAL DATA:  Central back pain since yesterday. History of enlarged heart. EXAM: CT ANGIOGRAPHY CHEST WITH CONTRAST TECHNIQUE: Multidetector CT imaging of the chest was performed using the  standard protocol during bolus administration of intravenous contrast. Multiplanar CT image reconstructions and MIPs were obtained to evaluate the vascular anatomy. CONTRAST:  100 mL of Isovue 370 intravenous contrast COMPARISON:  Current chest radiographs.  Chest CT dated 05/01/2014. FINDINGS: Angiographic study: There is a near occlusive pulmonary embolus which extends from the distal left pulmonary artery into all segmental branches with the exception of the lateral segment. There is a small, nonocclusive pulmonary embolus within the posterior,basilar segmental branch to the right lower lobe. No other convincing pulmonary emboli. The aorta is dilated. Ascending aorta measures 4.5 cm in diameter. There is no significant aortic enhancement. Dissection is not evaluated. RV to LV ratio is less than 1. No evidence of right heart strain. Neck base and axilla:  No mass or adenopathy. Mediastinum and hila: Heart is mildly enlarged. There are left coronary artery calcifications. No mediastinal or hilar masses or pathologically enlarged lymph nodes. Lungs and pleural: Minimal left effusion. There is dependent atelectasis most evident in the lower lobes, left somewhat greater than right. No convincing pneumonia. No pulmonary edema. No pneumothorax. Limited upper abdomen:  No acute findings. Musculoskeletal: Mild degenerative changes of the mid to lower thoracic spine. No osteoblastic or osteolytic lesions. Review of the MIP images confirms the above findings. IMPRESSION: 1. Bilateral lower lobe pulmonary emboli, and more significantly involving the left lower  lobe as detailed above. 2. There is dependent, primary lower lobe, atelectasis, also greater on the left. No evidence of pulmonary infarction. 3. Minimal left pleural effusion. 4. No pulmonary edema or convincing pneumonia. 5. **An incidental finding of potential clinical significance has been found. Ascending thoracic aortic aneurysm. Recommend semi-annual imaging followup by CTA or MRA and referral to cardiothoracic surgery if not already obtained. This recommendation follows 2010 ACCF/AHA/AATS/ACR/ASA/SCA/SCAI/SIR/STS/SVM Guidelines for the Diagnosis and Management of Patients With Thoracic Aortic Disease. Circulation. 2010; 121: Z610-R604: [id number]** Electronically Signed   By: Amie Portlandavid  Ormond M.D.   On: 01/01/2016 15:46    Lab Data:  CBC:  Recent Labs Lab 01/01/16 0907 01/02/16 0255  WBC 14.6* 11.9*  HGB 14.1 12.2*  HCT 41.8 36.5*  MCV 92.3 91.0  PLT 161 130*   Basic Metabolic Panel:  Recent Labs Lab 01/01/16 0907 01/02/16 0255  NA 137 138  K 4.7 4.0  CL 107 105  CO2 25 24  GLUCOSE 147* 143*  BUN 14 10  CREATININE 1.34* 1.20  CALCIUM 9.6 9.3   GFR: Estimated Creatinine Clearance: 55.8 mL/min (by C-G formula based on SCr of 1.2 mg/dL). Liver Function Tests:  Recent Labs Lab 01/01/16 1103  AST 17  ALT 15*  ALKPHOS 69  BILITOT 1.1  PROT 6.8  ALBUMIN 3.4*    Recent Labs Lab 01/01/16 1103  LIPASE 26   No results for input(s): AMMONIA in the last 168 hours. Coagulation Profile: No results for input(s): INR, PROTIME in the last 168 hours. Cardiac Enzymes: No results for input(s): CKTOTAL, CKMB, CKMBINDEX, TROPONINI in the last 168 hours. BNP (last 3 results) No results for input(s): PROBNP in the last 8760 hours. HbA1C: No results for input(s): HGBA1C in the last 72 hours. CBG: No results for input(s): GLUCAP in the last 168 hours. Lipid Profile: No results for input(s): CHOL, HDL, LDLCALC, TRIG, CHOLHDL, LDLDIRECT in the last 72 hours. Thyroid Function Tests: No  results for input(s): TSH, T4TOTAL, FREET4, T3FREE, THYROIDAB in the last 72 hours. Anemia Panel: No results for input(s): VITAMINB12, FOLATE, FERRITIN, TIBC, IRON, RETICCTPCT in the last 72 hours. Urine analysis:  No results found for: COLORURINE, APPEARANCEUR, LABSPEC, PHURINE, GLUCOSEU, HGBUR, BILIRUBINUR, KETONESUR, PROTEINUR, UROBILINOGEN, NITRITE, Hurshel Party M.D. [facility] 01/02/2016, 11:01 AM  Pager: [phone] Between 7am to 7pm - call Pager - [phone]  After 7pm go to [email or web address] - password [email or web address]  Call night coverage person covering after 7pm

## 2016-01-02 NOTE — Progress Notes (Addendum)
ANTICOAGULATION CONSULT NOTE - Initial Consult  Pharmacy Consult for Lovenox >>> Heparin Indication: pulmonary embolus  No Known Allergies  Patient Measurements: Height: 5\' 7"  (170.2 cm) Weight: 166 lb (75.3 kg) IBW/kg (Calculated) : 66.1 Heparin Dosing Weight: 78.4 kg  Vital Signs: Temp: 99.1 F (37.3 C) (08/20 1128) Temp Source: Oral (08/20 1128) BP: 135/69 (08/20 1128) Pulse Rate: 68 (08/20 1128)  Labs:  Recent Labs  01/01/16 0907 01/02/16 0255  HGB 14.1 12.2*  HCT 41.8 36.5*  PLT 161 130*  CREATININE 1.34* 1.20    Estimated Creatinine Clearance: 55.8 mL/min (by C-G formula based on SCr of 1.2 mg/dL).   Medical History: Past Medical History:  Diagnosis Date  . CAD (coronary artery disease), native coronary artery    Coronary calcifications noted on CT scan.   . Cardiomyopathy of undetermined type (HCC)   . Chronic systolic heart failure (HCC)    Echo 2015 EF 30-35%   . CVA (cerebral vascular accident) [facility](HCC) June 2013  . Embedded metal fragments    Metallic Pellet In Heart  . Gout   . History of ischemic vertebrobasilar artery cerebellar stroke   . Hyperlipemia   . Hyperlipidemia   . Hypertension   . Hypertensive heart disease   . Illiteracy   . Pre-diabetes   . Vitamin D deficiency     Assessment: 70 yo m admitted with back pain and SOB. Found with bilateral PEs and DVTs. Initially started on therapeutic Lovenox but changed to heparin IV this AM. Will start heparin when next dose of Lovenox would be due around 1800 tonight. CBC ok. Hgb 14.1>12.2, pts 130.  Goal of Therapy:  Heparin level 0.3-0.7 units/ml Monitor platelets by anticoagulation protocol: Yes   Plan:  - Start heparin infusion at 1350 units/hr tonight at 1800 - Heparin level tonight at 2300 - Daily HL, CBC - Monitor s/sx of bleeding, transition to oral AC  Cassie L. Roseanne RenoStewart, PharmD Clinical Pharmacist Pager: [phone] 01/02/2016 12:00 PM

## 2016-01-03 DIAGNOSIS — I25119 Atherosclerotic heart disease of native coronary artery with unspecified angina pectoris: Secondary | ICD-10-CM

## 2016-01-03 DIAGNOSIS — E785 Hyperlipidemia, unspecified: Secondary | ICD-10-CM

## 2016-01-03 DIAGNOSIS — I5022 Chronic systolic (congestive) heart failure: Secondary | ICD-10-CM

## 2016-01-03 DIAGNOSIS — I10 Essential (primary) hypertension: Secondary | ICD-10-CM

## 2016-01-03 LAB — URINALYSIS, ROUTINE W REFLEX MICROSCOPIC
Bilirubin Urine: NEGATIVE
Glucose, UA: NEGATIVE mg/dL
Hgb urine dipstick: NEGATIVE
Ketones, ur: NEGATIVE mg/dL
Leukocytes, UA: NEGATIVE
Nitrite: NEGATIVE
Protein, ur: NEGATIVE mg/dL
Specific Gravity, Urine: 1.015 (ref 1.005–1.030)
pH: 5.5 (ref 5.0–8.0)

## 2016-01-03 LAB — CBC
HEMATOCRIT: 33.5 % — AB (ref 39.0–52.0)
HEMOGLOBIN: 11.7 g/dL — AB (ref 13.0–17.0)
MCH: 30.9 pg (ref 26.0–34.0)
MCHC: 34.9 g/dL (ref 30.0–36.0)
MCV: 88.4 fL (ref 78.0–100.0)
Platelets: 123 10*3/uL — ABNORMAL LOW (ref 150–400)
RBC: 3.79 MIL/uL — ABNORMAL LOW (ref 4.22–5.81)
RDW: 12.5 % (ref 11.5–15.5)
WBC: 10.9 10*3/uL — AB (ref 4.0–10.5)

## 2016-01-03 LAB — HEPARIN LEVEL (UNFRACTIONATED): HEPARIN UNFRACTIONATED: 0.58 [IU]/mL (ref 0.30–0.70)

## 2016-01-03 MED ORDER — TAMSULOSIN HCL 0.4 MG PO CAPS
0.4000 mg | ORAL_CAPSULE | Freq: Every day | ORAL | Status: DC
  Administered 2016-01-03 – 2016-01-05 (×3): 0.4 mg via ORAL
  Filled 2016-01-03 (×3): qty 1

## 2016-01-03 NOTE — Progress Notes (Signed)
ANTICOAGULATION CONSULT NOTE - Follow Up Consult  Pharmacy Consult for Heparin  Indication: pulmonary embolus and DVT  No Known Allergies  Patient Measurements: Height: 5\' 7"  (170.2 cm) Weight: 166 lb (75.3 kg) IBW/kg (Calculated) : 66.1  Vital Signs: Temp: 97.9 F (36.6 C) (08/21 0715) Temp Source: Oral (08/21 0715) BP: 118/61 (08/21 0715) Pulse Rate: 62 (08/21 0715)  Labs:  Recent Labs  01/01/16 0907 01/02/16 0255 01/02/16 2326 01/03/16 0553  HGB 14.1 12.2*  --  11.7*  HCT 41.8 36.5*  --  33.5*  PLT 161 130*  --  123*  HEPARINUNFRC  --   --  0.64 0.58  CREATININE 1.34* 1.20  --   --     Estimated Creatinine Clearance: 55.8 mL/min (by C-G formula based on SCr of 1.2 mg/dL).   Assessment: Therapeutic lovenox to heparin for bilateral PE/DVT. No PTA anti-coag - INR on admit 0.9. Confirmatory HL remains therapeutic this AM at 0.58. Plan to transition to DOAC during admission. CBC low stable; Hgb 11.7, plt 123. No bleeding reported.   Goal of Therapy:  Heparin level 0.3-0.7 units/ml Monitor platelets by anticoagulation protocol: Yes   Plan:  -Cont heparin at 1350 units/hr -Daily HL, CBC -Monitor s/sx bleeding -F/u transition to DOAC  Reuel Derbyobert J [name] 01/03/2016,7:49 AM

## 2016-01-03 NOTE — Significant Event (Signed)
RN went into room to do I/O catheter. Patient refusing at this time and wants to continue to try to void. Patient requesting oral fluids including tea to stimulate voiding. Will give patient opportunity to try to spontaneously void.

## 2016-01-03 NOTE — Plan of Care (Signed)
Problem: Safety: Goal: Ability to remain free from injury will improve Outcome: Progressing No fall or injury noted, safety precautions maintained  Problem: Skin Integrity: Goal: Risk for impaired skin integrity will decrease Outcome: Progressing No skin issues reported  Problem: Tissue Perfusion: Goal: Risk factors for ineffective tissue perfusion will decrease Outcome: Progressing Resting quietly in the bed, no c/o sob or comlications of DVT noted  Problem: Nutrition: Goal: Adequate nutrition will be maintained Outcome: Progressing Taking po well  Problem: Bowel/Gastric: Goal: Will not experience complications related to bowel motility Outcome: Progressing Denies gastric and bowel issues

## 2016-01-03 NOTE — Progress Notes (Signed)
[facility]                                                                              Patient Demographics  Dennis [name], is a 70 y.o. male, DOB - February 24, 1946, ZOX:[id number]  Admit date - 01/01/2016   Admitting Physician Barnetta Chapel, MD  Outpatient Primary MD for the patient is [name], MD  Outpatient specialists:   LOS - 2  days    Chief Complaint  Patient presents with  . Chest Pain  . Back Pain       Brief summary   70 year old male with history of dementia, CHF with EF of 30-35%, pulmonary HTN, CAD, CVA, HTN and HL. Patient presents with back pain and SOB that started last night. No history of recent surgery or travel, but patient is said to be largely sedentary at home. CTA of Chest done presentation revealed Bilateral pulmonary embolism, worse on the left. 4.5 CM ascending Thoracic aortic aneurysm was also noted on CTA Chest. EKG revealed poor progression of R wave. Mild leukocytosis is noted, with compression atelectasis and mild pleural effusion (Likely secondary to the PE). No headache, no neck pain, no fever or chills, no GI symptoms or urinary symptoms   Assessment & Plan   Acute bilateral pulmonary embolism, Bilateral DVT - Currently no chest pain or shortness of breath -CT angiogram of the chest showed bilateral lower lobe pulmonary emboli more significantly involving the left lower lobe - Currently on IV heparin, patient is agreeable to NOAC's, awaiting case management consult for insurance co-pays. - 2-D echo showed EF of 45% with akinesis of basal inferior myocardium, grade 1 diastolic dysfunction  Ascending thoracic aortic aneurysm - CTA of the chest also showed incidental finding of ascending thoracic aortic aneurysm, 4.5 cm. Recommended semiannual/6 months imaging followed up by CT angiogram or MRA and referred to cardiothoracic surgery. Will recommend CT surgery outpatient appointment at discharge.  Dementia -  Currently stable without acute issues, continue Zoloft, Aricept   Coronary  artery diseaseWith pulmonary hypertension - Currently no chest pain. 2-D echo from 2013 had shown EF of 30-35%, elevated PA pressure 74  - Repeat 2-D echo 8/17 shows improved EF of 45%. - Continue aspirin, Coreg, lisinopril, statin   Hypertension - Currently stable  - Continue Coreg, lisinopril, amlodipine   Hyperlipidemia - Continue statin    Code Status: full DVT Prophylaxis:  Lovenox Family Communication: Discussed in detail with the patient, all imaging results, lab results explained to the patient . PT evaluation tomorrow    Disposition Plan: Possible DC in next 24-48 hours  Time Spent in minutes  25 minutes  Procedures:  CTA chest Doppler ultrasounds for lower extremities  Consultants :   None   Antimicrobials:      Medications  Scheduled Meds: . amLODipine  10 mg Oral Daily  . aspirin EC  81 mg Oral Daily  . carvedilol  25 mg Oral BID  . cholecalciferol  2,000 Units Oral Daily  . donepezil  10 mg Oral Daily  . famotidine  20 mg Oral BID  . furosemide  40 mg Oral QODAY  .  lisinopril  30 mg Oral Daily  . pravastatin  40 mg Oral Daily  . sertraline  100 mg Oral Daily   Continuous Infusions: . heparin 1,350 Units/hr (01/03/16 1019)   PRN Meds:.ALPRAZolam   Antibiotics   Anti-infectives    None        Subjective:   Kandis MannanJames Bradly was seen and examined today. Patient denies dizziness, chest pain, shortness of breath, abdominal pain, N/V/D/C, new weakness, numbess, tingling. No acute events overnight.  No complaints, no fevers.  Objective:   Vitals:   01/02/16 2006 01/03/16 0715 01/03/16 1010 01/03/16 1100  BP: 121/75 118/61 117/76   Pulse: (!) 55 62 (!) 49   Resp:    18  Temp: 98.6 F (37 C) 97.9 F (36.6 C)  97.8 F (36.6 C)  TempSrc: Oral Oral  Oral  SpO2: 93% 95% 97%   Weight:      Height:        Intake/Output Summary (Last 24 hours) at 01/03/16 1215 Last  data filed at 01/03/16 1200  Gross per 24 hour  Intake            689.5 ml  Output              350 ml  Net            339.5 ml     Wt Readings from Last 3 Encounters:  01/02/16 75.3 kg (166 lb)  12/15/15 80.6 kg (177 lb 12.8 oz)  09/13/15 81.9 kg (180 lb 9.6 oz)     Exam  General: Alert and oriented , NAD, Mostly says yes to all the questions, unsure if he understands everything  HEENT:  Neck:   Cardiovascular: S1 S2 auscultated, no rubs, murmurs or gallops. Regular rate and rhythm.  Respiratory: Clear to auscultation bilaterally, no wheezing, rales or rhonchi  Gastrointestinal: Soft, nontender, nondistended, + bowel sounds  Ext: no cyanosis clubbing or edema  Neuro:No new deficit  Skin: No rashes  Psych: Normal affect and demeanor, alert and oriented   Data Reviewed:  I have personally reviewed following labs and imaging studies  Micro Results No results found for this or any previous visit (from the past 240 hour(s)).  Radiology Reports Dg Chest 2 View  Result Date: 01/01/2016 CLINICAL DATA:  Pt states hx enlarged heart. Pt c/o mid/center (around T-7) back pain off and on, worsening yesterday and today. Pt states the pain is worse when breathing; he took a pain pill yesterday and the pain did not subside. Pt is treated for HTN; non-smoker. EXAM: CHEST  2 VIEW COMPARISON:  05/01/2014 CT.  10/17/2011 chest radiograph. FINDINGS: Midline trachea. Mild cardiomegaly. Tortuous thoracic aorta. Metallic foreign object again projects within the anterior left thoracic cavity. No pleural effusion or pneumothorax. Patchy left base airspace disease is new. Multiple right-sided rib fractures, present on 05/01/2014. IMPRESSION: New left base airspace disease. Considerations include infection, atelectasis, or in the appropriate clinical setting, left-sided pulmonary embolism. Electronically Signed   By: Jeronimo GreavesKyle  Talbot M.D.   On: 01/01/2016 09:54   Ct Angio Chest Pe W And/or Wo  Contrast  Result Date: 01/01/2016 CLINICAL DATA:  Central back pain since yesterday. History of enlarged heart. EXAM: CT ANGIOGRAPHY CHEST WITH CONTRAST TECHNIQUE: Multidetector CT imaging of the chest was performed using the standard protocol during bolus administration of intravenous contrast. Multiplanar CT image reconstructions and MIPs were obtained to evaluate the vascular anatomy. CONTRAST:  100 mL of Isovue 370 intravenous contrast COMPARISON:  Current  chest radiographs.  Chest CT dated 05/01/2014. FINDINGS: Angiographic study: There is a near occlusive pulmonary embolus which extends from the distal left pulmonary artery into all segmental branches with the exception of the lateral segment. There is a small, nonocclusive pulmonary embolus within the posterior,basilar segmental branch to the right lower lobe. No other convincing pulmonary emboli. The aorta is dilated. Ascending aorta measures 4.5 cm in diameter. There is no significant aortic enhancement. Dissection is not evaluated. RV to LV ratio is less than 1. No evidence of right heart strain. Neck base and axilla:  No mass or adenopathy. Mediastinum and hila: Heart is mildly enlarged. There are left coronary artery calcifications. No mediastinal or hilar masses or pathologically enlarged lymph nodes. Lungs and pleural: Minimal left effusion. There is dependent atelectasis most evident in the lower lobes, left somewhat greater than right. No convincing pneumonia. No pulmonary edema. No pneumothorax. Limited upper abdomen:  No acute findings. Musculoskeletal: Mild degenerative changes of the mid to lower thoracic spine. No osteoblastic or osteolytic lesions. Review of the MIP images confirms the above findings. IMPRESSION: 1. Bilateral lower lobe pulmonary emboli, and more significantly involving the left lower lobe as detailed above. 2. There is dependent, primary lower lobe, atelectasis, also greater on the left. No evidence of pulmonary infarction.  3. Minimal left pleural effusion. 4. No pulmonary edema or convincing pneumonia. 5. **An incidental finding of potential clinical significance has been found. Ascending thoracic aortic aneurysm. Recommend semi-annual imaging followup by CTA or MRA and referral to cardiothoracic surgery if not already obtained. This recommendation follows 2010 ACCF/AHA/AATS/ACR/ASA/SCA/SCAI/SIR/STS/SVM Guidelines for the Diagnosis and Management of Patients With Thoracic Aortic Disease. Circulation. 2010; 121: U981-X914: [id number]** Electronically Signed   By: Amie Portlandavid  Ormond M.D.   On: 01/01/2016 15:46    Lab Data:  CBC:  Recent Labs Lab 01/01/16 0907 01/02/16 0255 01/03/16 0553  WBC 14.6* 11.9* 10.9*  HGB 14.1 12.2* 11.7*  HCT 41.8 36.5* 33.5*  MCV 92.3 91.0 88.4  PLT 161 130* 123*   Basic Metabolic Panel:  Recent Labs Lab 01/01/16 0907 01/02/16 0255  NA 137 138  K 4.7 4.0  CL 107 105  CO2 25 24  GLUCOSE 147* 143*  BUN 14 10  CREATININE 1.34* 1.20  CALCIUM 9.6 9.3   GFR: Estimated Creatinine Clearance: 55.8 mL/min (by C-G formula based on SCr of 1.2 mg/dL). Liver Function Tests:  Recent Labs Lab 01/01/16 1103  AST 17  ALT 15*  ALKPHOS 69  BILITOT 1.1  PROT 6.8  ALBUMIN 3.4*    Recent Labs Lab 01/01/16 1103  LIPASE 26   No results for input(s): AMMONIA in the last 168 hours. Coagulation Profile: No results for input(s): INR, PROTIME in the last 168 hours. Cardiac Enzymes: No results for input(s): CKTOTAL, CKMB, CKMBINDEX, TROPONINI in the last 168 hours. BNP (last 3 results) No results for input(s): PROBNP in the last 8760 hours. HbA1C: No results for input(s): HGBA1C in the last 72 hours. CBG: No results for input(s): GLUCAP in the last 168 hours. Lipid Profile: No results for input(s): CHOL, HDL, LDLCALC, TRIG, CHOLHDL, LDLDIRECT in the last 72 hours. Thyroid Function Tests: No results for input(s): TSH, T4TOTAL, FREET4, T3FREE, THYROIDAB in the last 72 hours. Anemia  Panel: No results for input(s): VITAMINB12, FOLATE, FERRITIN, TIBC, IRON, RETICCTPCT in the last 72 hours. Urine analysis: No results found for: COLORURINE, APPEARANCEUR, LABSPEC, PHURINE, GLUCOSEU, HGBUR, BILIRUBINUR, KETONESUR, PROTEINUR, UROBILINOGEN, NITRITE, [place]urs[place] PartyLEUKOCYTESUR   Rudell Ortman M.D. [facility] 01/03/2016, 12:15 PM  Pager: [phone] Between 7am to 7pm - call Pager - [phone]  After 7pm go to [email or web address] - password [email or web address]  Call night coverage person covering after 7pm

## 2016-01-03 NOTE — Significant Event (Signed)
I/O perform using sterile technique. Received back 900cc of urine. Will bladder scan at 1930pm per MD's verbal order earlier.

## 2016-01-03 NOTE — Progress Notes (Signed)
8PM bladder scan reveals 278 mL urine. MD paged. Orders received to straight cath if bladder scan > 300 mL urine or if pt uncomfortable. RN to place condom cath and continue to monitor.

## 2016-01-03 NOTE — Progress Notes (Signed)
At 1400 informed Dr. Isidoro Donningai pt bladder scan showed 795 ml of urine in bladder.  Instructed to cath pt now and recheck bladder after 4 hours.  Notified pt's nurse Hyiu.  Will continue to monitor. Coleston Dirosa, Charity fundraiserN.

## 2016-01-03 NOTE — Consult Note (Signed)
[facility] CM Inpatient Consult   01/03/2016  Dennis [name] 1946/01/02 [id number]  Patient screened for potential [facility] Management services. Patient is eligible for [facility] Care Management services under patient's [facility]  plan.  Met with the patient at the bedside.  Patient denies any community needs except inquiring about concerns to possibly obtain dentures for his gum disease.  Encourage the patient to call customer service on the back of his insurance card to check his benefits.  Patient verbalized understanding .  Patient accepted a brochure with contact information for care management services. No further needs noted.  Please place a [facility] Care Management consult or for questions contact:   Natividad Brood, RN BSN [facility] Liaison  [phone] business mobile phone Toll free office [phone]

## 2016-01-04 ENCOUNTER — Inpatient Hospital Stay (HOSPITAL_COMMUNITY): Payer: Commercial Managed Care - HMO

## 2016-01-04 DIAGNOSIS — F0390 Unspecified dementia without behavioral disturbance: Secondary | ICD-10-CM

## 2016-01-04 LAB — HEPARIN LEVEL (UNFRACTIONATED): Heparin Unfractionated: 0.62 IU/mL (ref 0.30–0.70)

## 2016-01-04 LAB — CBC
HCT: 35.8 % — ABNORMAL LOW (ref 39.0–52.0)
Hemoglobin: 12.2 g/dL — ABNORMAL LOW (ref 13.0–17.0)
MCH: 30.4 pg (ref 26.0–34.0)
MCHC: 34.1 g/dL (ref 30.0–36.0)
MCV: 89.3 fL (ref 78.0–100.0)
PLATELETS: 133 10*3/uL — AB (ref 150–400)
RBC: 4.01 MIL/uL — AB (ref 4.22–5.81)
RDW: 12.5 % (ref 11.5–15.5)
WBC: 9.2 10*3/uL (ref 4.0–10.5)

## 2016-01-04 LAB — BASIC METABOLIC PANEL
Anion gap: 5 (ref 5–15)
BUN: 14 mg/dL (ref 6–20)
CALCIUM: 9 mg/dL (ref 8.9–10.3)
CO2: 26 mmol/L (ref 22–32)
CREATININE: 1.28 mg/dL — AB (ref 0.61–1.24)
Chloride: 104 mmol/L (ref 101–111)
GFR, EST NON AFRICAN AMERICAN: 56 mL/min — AB (ref >60)
Glucose, Bld: 104 mg/dL — ABNORMAL HIGH (ref 65–99)
Potassium: 3.6 mmol/L (ref 3.5–5.1)
SODIUM: 135 mmol/L (ref 135–145)

## 2016-01-04 LAB — MAGNESIUM: MAGNESIUM: 2 mg/dL (ref 1.7–2.4)

## 2016-01-04 MED ORDER — RIVAROXABAN 15 MG PO TABS
15.0000 mg | ORAL_TABLET | Freq: Two times a day (BID) | ORAL | Status: DC
  Administered 2016-01-04: 15 mg via ORAL
  Filled 2016-01-04: qty 1

## 2016-01-04 MED ORDER — RIVAROXABAN (XARELTO) EDUCATION KIT FOR DVT/PE PATIENTS
PACK | Freq: Once | Status: AC
  Administered 2016-01-04: 15:00:00
  Filled 2016-01-04: qty 1

## 2016-01-04 MED ORDER — RIVAROXABAN 20 MG PO TABS: 20.0000 mg | ORAL_TABLET | Freq: Every day | ORAL | Status: DC

## 2016-01-04 MED ORDER — RIVAROXABAN 15 MG PO TABS: 15.0000 mg | ORAL_TABLET | Freq: Two times a day (BID) | ORAL | Status: DC

## 2016-01-04 NOTE — Progress Notes (Signed)
RE: Benefit check   Mardene SayerDora Greenlee CMA        S/W KARESS @  [employer] # [phone]   XARELTO 20 MG DAILY  ( 30 )   COVER- YES  CO-PAY- $ 47.00  TIER- 3 DRUG  PRIOR APPROVAL-NO  MAIL ORDER FOR 90 DAY SUPPLY $ 131.00   ELIQUIS 2.5 MG BID ( 30 )  COVER- YES  CO-PAY- $ 47.00  TIER- 3 DRUG  PRIOR APPROVAL- NO  MAIL-ORDER FOR 90 DAY SUPPLY $ 131.00

## 2016-01-04 NOTE — Progress Notes (Signed)
PT Cancellation Note  Patient Details Name: Dennis [name] MRN: [id number] DOB: 1946-03-22   Cancelled Treatment:    Reason Eval/Treat Not Completed: Patient not medically ready (Bilateral PEs, has not yet received blood thinner) Will hold PT evaluation at this time, nsg aware. Plan for blood thinner xerelto indicated, will await dosing.    Fabio AsaWerner, Amato Sevillano J 01/04/2016, 4:15 PM Charlotte Crumbevon Dajohn Ellender, PT DPT  [phone]

## 2016-01-04 NOTE — Progress Notes (Signed)
Heparin drip order D/C'ed at 14:30. Heparin drip stopped and disconnected this PM by this RN. Will continue to monitor.

## 2016-01-04 NOTE — Care Management Important Message (Signed)
Important Message  Patient Details  Name: Zollie PeeJames E Hallquist MRN: [id number] Date of Birth: 1945/07/05   Medicare Important Message Given:  Yes    Dorena BodoIris Sherica Paternostro 01/04/2016, 10:22 AM

## 2016-01-04 NOTE — Progress Notes (Signed)
Pt with 21 beats of asymptomatic NSVT. Pt resting comfortably in bed. Last BMET was on 8/20 and no Mag level since admission. MD paged. Orders received for BMET and Mag. Will continue to monitor.

## 2016-01-04 NOTE — Progress Notes (Signed)
ANTICOAGULATION CONSULT NOTE - Follow Up Consult  Pharmacy Consult for Heparin  Indication: pulmonary embolus and DVT  No Known Allergies  Patient Measurements: Height: 5\' 7"  (170.2 cm) Weight: 166 lb (75.3 kg) IBW/kg (Calculated) : 66.1  Vital Signs: Temp: 98.7 F (37.1 C) (08/21 2205) Temp Source: Oral (08/21 2205) BP: 116/81 (08/21 2205)  Labs:  Recent Labs  01/02/16 0255 01/02/16 2326 01/03/16 0553 01/04/16 0339 01/04/16 0340  HGB 12.2*  --  11.7*  --  12.2*  HCT 36.5*  --  33.5*  --  35.8*  PLT 130*  --  123*  --  133*  HEPARINUNFRC  --  0.64 0.58 0.62  --   CREATININE 1.20  --   --   --   --     Estimated Creatinine Clearance: 55.8 mL/min (by C-G formula based on SCr of 1.2 mg/dL).   Assessment: Therapeutic lovenox to heparin for bilateral PE/DVT. HL remains therapeutic today at 0.62. Plan to transition to DOAC. CBC low stable; Hgb 12.2, plt 133. No bleeding reported.    Goal of Therapy:  Heparin level 0.3-0.7 units/ml Monitor platelets by anticoagulation protocol: Yes   Plan:  -Cont heparin at 1350 units/hr -Daily HL, CBC -Monitor s/sx bleeding -F/u transition to DOAC  Sherle Poeob Ethylene Reznick, PharmD Clinical Pharmacist 9:47 AM, 01/04/2016

## 2016-01-04 NOTE — Progress Notes (Signed)
ANTICOAGULATION CONSULT NOTE - Follow Up Consult  Pharmacy Consult for xarelto Indication: pulmonary embolus and DVT  No Known Allergies  Patient Measurements: Height: 5\' 7"  (170.2 cm) Weight: 166 lb (75.3 kg) IBW/kg (Calculated) : 66.1  Vital Signs: Temp: 98.5 F (36.9 C) (08/22 1203) Temp Source: Oral (08/22 1203) BP: 133/77 (08/22 1203) Pulse Rate: 61 (08/22 1203)  Labs:  Recent Labs  01/02/16 0255 01/02/16 2326 01/03/16 0553 01/04/16 0339 01/04/16 0340  HGB 12.2*  --  11.7*  --  12.2*  HCT 36.5*  --  33.5*  --  35.8*  PLT 130*  --  123*  --  133*  HEPARINUNFRC  --  0.64 0.58 0.62  --   CREATININE 1.20  --   --   --   --     Estimated Creatinine Clearance: 55.8 mL/min (by C-G formula based on SCr of 1.2 mg/dL).   Assessment: Heparin >> Xarelto for bilateral PE/DVT. HL therapeutic today - will start Xarelto this afternoon. CBC low stable; Hgb 12.2, plt 133. No bleeding reported.   Plan:  -D/C heparin -Xarelto 15mg  BID x21 days starting today followed by Xarelto 20mg  daily starting 01/25/16 -Monitor CBC, s/sx bleeding  Sherle Poeob [name], PharmD Clinical Pharmacist 2:29 PM, 01/04/2016

## 2016-01-04 NOTE — Discharge Instructions (Signed)
Information on my medicine - XARELTO (rivaroxaban)  This medication education was reviewed with me or my healthcare representative as part of my discharge preparation.  The pharmacist that spoke with me during my hospital stay was:  Reuel Derbyobert J. Kalkidan Caudell, PharmD.  WHY WAS XARELTO PRESCRIBED FOR YOU? Xarelto was prescribed to treat blood clots that may have been found in the veins of your legs (deep vein thrombosis) or in your lungs (pulmonary embolism) and to reduce the risk of them occurring again.  What do you need to know about Xarelto? The starting dose is one 15 mg tablet taken TWICE daily with food for the FIRST 21 DAYS then on (enter date) 01/25/16  the dose is changed to one 20 mg tablet taken ONCE A DAY with your evening meal.  DO NOT stop taking Xarelto without talking to the health care provider who prescribed the medication.  Refill your prescription for 20 mg tablets before you run out.  After discharge, you should have regular check-up appointments with your healthcare provider that is prescribing your Xarelto.  In the future your dose may need to be changed if your kidney function changes by a significant amount.  What do you do if you miss a dose? If you are taking Xarelto TWICE DAILY and you miss a dose, take it as soon as you remember. You may take two 15 mg tablets (total 30 mg) at the same time then resume your regularly scheduled 15 mg twice daily the next day.  If you are taking Xarelto ONCE DAILY and you miss a dose, take it as soon as you remember on the same day then continue your regularly scheduled once daily regimen the next day. Do not take two doses of Xarelto at the same time.   Important Safety Information Xarelto is a blood thinner medicine that can cause bleeding. You should call your healthcare provider right away if you experience any of the following: ? Bleeding from an injury or your nose that does not stop. ? Unusual colored urine (red or dark brown)  or unusual colored stools (red or black). ? Unusual bruising for unknown reasons. ? A serious fall or if you hit your head (even if there is no bleeding).  Some medicines may interact with Xarelto and might increase your risk of bleeding while on Xarelto. To help avoid this, consult your healthcare provider or pharmacist prior to using any new prescription or non-prescription medications, including herbals, vitamins, non-steroidal anti-inflammatory drugs (NSAIDs) and supplements.  This website has more information on Xarelto: [email or web address].[email or web address].

## 2016-01-04 NOTE — Progress Notes (Signed)
[facility]                                                                              Patient Demographics  Dennis [name], is a 70 y.o. male, DOB - 1946-02-01, ZOX:[id number]  Admit date - 01/01/2016   Admitting Physician Barnetta ChapelSylvester I Ogbata, MD  Outpatient Primary MD for the patient is Nadean CorwinMCKEOWN,[name] DAVID, MD  Outpatient specialists:   LOS - 3  days    Chief Complaint  Patient presents with  . Chest Pain  . Back Pain       Brief summary   70 year old male with history of dementia, CHF with EF of 30-35%, pulmonary HTN, CAD, CVA, HTN and HL. Patient presents with back pain and SOB that started last night. No history of recent surgery or travel, but patient is said to be largely sedentary at home. CTA of Chest done presentation revealed Bilateral pulmonary embolism, worse on the left. 4.5 CM ascending Thoracic aortic aneurysm was also noted on CTA Chest. EKG revealed poor progression of R wave. Mild leukocytosis is noted, with compression atelectasis and mild pleural effusion (Likely secondary to the PE). No headache, no neck pain, no fever or chills, no GI symptoms or urinary symptoms   Assessment & Plan   Acute bilateral pulmonary embolism, Bilateral DVT - Currently no chest pain or shortness of breath -CT angiogram of the chest showed bilateral lower lobe pulmonary emboli more significantly involving the left lower lobe - Currently on IV heparin, patient is agreeable to NOAC's. Appreciate case management assistance. Will start patient on xarelto today  - 2-D echo showed EF of 45% with akinesis of basal inferior myocardium, grade 1 diastolic dysfunction  Ascending thoracic aortic aneurysm - CTA of the chest also showed incidental finding of ascending thoracic aortic aneurysm, 4.5 cm. Recommended semiannual/6 months imaging followed up by CT angiogram or MRA and referred to cardiothoracic surgery. Will recommend CT surgery outpatient appointment at  discharge.  Urinary retention - Started on Flomax, will obtain renal ultrasound to rule out any obstruction  Dementia - Currently stable without acute issues, continue Zoloft, Aricept   Coronary  artery diseaseWith pulmonary hypertension - Currently no chest pain. 2-D echo from 2013 had shown EF of 30-35%, elevated PA pressure 74  - Repeat 2-D echo 8/17 shows improved EF of 45%. - Continue aspirin, Coreg, lisinopril, statin   Hypertension - Currently stable  - Continue Coreg, lisinopril, amlodipine   Hyperlipidemia - Continue statin    Code Status: full DVT Prophylaxis:  Lovenox Family Communication: Discussed in detail with the patient, all imaging results, lab results explained to the patient and patient's daughter .  PT evaluation pending   Disposition Plan: Possible DC in a.m., PT evaluation pending  Time Spent in minutes  25 minutes  Procedures:  CTA chest Doppler ultrasounds for lower extremities  Consultants :   None   Antimicrobials:      Medications  Scheduled Meds: . amLODipine  10 mg Oral Daily  . aspirin EC  81 mg Oral Daily  . carvedilol  25 mg Oral BID  . cholecalciferol  2,000 Units Oral Daily  .  donepezil  10 mg Oral Daily  . famotidine  20 mg Oral BID  . furosemide  40 mg Oral QODAY  . lisinopril  30 mg Oral Daily  . pravastatin  40 mg Oral Daily  . sertraline  100 mg Oral Daily  . tamsulosin  0.4 mg Oral Daily   Continuous Infusions: . heparin 1,350 Units/hr (01/04/16 0705)   PRN Meds:.ALPRAZolam   Antibiotics   Anti-infectives    None        Subjective:   [name] was seen and examined today. Denies any chest pain. Patient denies dizziness,  abdominal pain, N/V/D/C, new weakness, numbess, tingling. No acute events overnight.  No complaints, no fevers. No shortness of breath.  Objective:   Vitals:   01/03/16 2205 01/04/16 1000 01/04/16 1048 01/04/16 1203  BP: 116/81 126/62 126/62 133/77  Pulse:    61  Resp: 18   18   Temp: 98.7 F (37.1 C)   98.5 F (36.9 C)  TempSrc: Oral   Oral  SpO2: 95%   96%  Weight:      Height:        Intake/Output Summary (Last 24 hours) at 01/04/16 1403 Last data filed at 01/04/16 1020  Gross per 24 hour  Intake           1027.5 ml  Output             1650 ml  Net           -622.5 ml     Wt Readings from Last 3 Encounters:  01/02/16 75.3 kg (166 lb)  12/15/15 80.6 kg (177 lb 12.8 oz)  09/13/15 81.9 kg (180 lb 9.6 oz)     Exam  General: Alert and oriented, NAD, Mostly says yes to all the questions, unsure if he understands everything  HEENT:  Neck:   Cardiovascular: S1 S2 clear, RRR  Respiratory: CTAB  Gastrointestinal: Soft, nontender, nondistended, + bowel sounds  Ext: no cyanosis clubbing or edema  Neuro:No new deficit  Skin: No rashes  Psych: Normal affect and demeanor   Data Reviewed:  I have personally reviewed following labs and imaging studies  Micro Results No results found for this or any previous visit (from the past 240 hour(s)).  Radiology Reports Dg Chest 2 View  Result Date: 01/01/2016 CLINICAL DATA:  Pt states hx enlarged heart. Pt c/o mid/center (around T-7) back pain off and on, worsening yesterday and today. Pt states the pain is worse when breathing; he took a pain pill yesterday and the pain did not subside. Pt is treated for HTN; non-smoker. EXAM: CHEST  2 VIEW COMPARISON:  05/01/2014 CT.  10/17/2011 chest radiograph. FINDINGS: Midline trachea. Mild cardiomegaly. Tortuous thoracic aorta. Metallic foreign object again projects within the anterior left thoracic cavity. No pleural effusion or pneumothorax. Patchy left base airspace disease is new. Multiple right-sided rib fractures, present on 05/01/2014. IMPRESSION: New left base airspace disease. Considerations include infection, atelectasis, or in the appropriate clinical setting, left-sided pulmonary embolism. Electronically Signed   By: Jeronimo Greaves M.D.   On: 01/01/2016  09:54   Ct Angio Chest Pe W And/or Wo Contrast  Result Date: 01/01/2016 CLINICAL DATA:  Central back pain since yesterday. History of enlarged heart. EXAM: CT ANGIOGRAPHY CHEST WITH CONTRAST TECHNIQUE: Multidetector CT imaging of the chest was performed using the standard protocol during bolus administration of intravenous contrast. Multiplanar CT image reconstructions and MIPs were obtained to evaluate the vascular anatomy. CONTRAST:  100 mL  of Isovue 370 intravenous contrast COMPARISON:  Current chest radiographs.  Chest CT dated 05/01/2014. FINDINGS: Angiographic study: There is a near occlusive pulmonary embolus which extends from the distal left pulmonary artery into all segmental branches with the exception of the lateral segment. There is a small, nonocclusive pulmonary embolus within the posterior,basilar segmental branch to the right lower lobe. No other convincing pulmonary emboli. The aorta is dilated. Ascending aorta measures 4.5 cm in diameter. There is no significant aortic enhancement. Dissection is not evaluated. RV to LV ratio is less than 1. No evidence of right heart strain. Neck base and axilla:  No mass or adenopathy. Mediastinum and hila: Heart is mildly enlarged. There are left coronary artery calcifications. No mediastinal or hilar masses or pathologically enlarged lymph nodes. Lungs and pleural: Minimal left effusion. There is dependent atelectasis most evident in the lower lobes, left somewhat greater than right. No convincing pneumonia. No pulmonary edema. No pneumothorax. Limited upper abdomen:  No acute findings. Musculoskeletal: Mild degenerative changes of the mid to lower thoracic spine. No osteoblastic or osteolytic lesions. Review of the MIP images confirms the above findings. IMPRESSION: 1. Bilateral lower lobe pulmonary emboli, and more significantly involving the left lower lobe as detailed above. 2. There is dependent, primary lower lobe, atelectasis, also greater on the  left. No evidence of pulmonary infarction. 3. Minimal left pleural effusion. 4. No pulmonary edema or convincing pneumonia. 5. **An incidental finding of potential clinical significance has been found. Ascending thoracic aortic aneurysm. Recommend semi-annual imaging followup by CTA or MRA and referral to cardiothoracic surgery if not already obtained. This recommendation follows 2010 ACCF/AHA/AATS/ACR/ASA/SCA/SCAI/SIR/STS/SVM Guidelines for the Diagnosis and Management of Patients With Thoracic Aortic Disease. Circulation. 2010; 121: Z610-R604: [id number]** Electronically Signed   By: Amie Portlandavid  Ormond M.D.   On: 01/01/2016 15:46    Lab Data:  CBC:  Recent Labs Lab 01/01/16 0907 01/02/16 0255 01/03/16 0553 01/04/16 0340  WBC 14.6* 11.9* 10.9* 9.2  HGB 14.1 12.2* 11.7* 12.2*  HCT 41.8 36.5* 33.5* 35.8*  MCV 92.3 91.0 88.4 89.3  PLT 161 130* 123* 133*   Basic Metabolic Panel:  Recent Labs Lab 01/01/16 0907 01/02/16 0255  NA 137 138  K 4.7 4.0  CL 107 105  CO2 25 24  GLUCOSE 147* 143*  BUN 14 10  CREATININE 1.34* 1.20  CALCIUM 9.6 9.3   GFR: Estimated Creatinine Clearance: 55.8 mL/min (by C-G formula based on SCr of 1.2 mg/dL). Liver Function Tests:  Recent Labs Lab 01/01/16 1103  AST 17  ALT 15*  ALKPHOS 69  BILITOT 1.1  PROT 6.8  ALBUMIN 3.4*    Recent Labs Lab 01/01/16 1103  LIPASE 26   No results for input(s): AMMONIA in the last 168 hours. Coagulation Profile: No results for input(s): INR, PROTIME in the last 168 hours. Cardiac Enzymes: No results for input(s): CKTOTAL, CKMB, CKMBINDEX, TROPONINI in the last 168 hours. BNP (last 3 results) No results for input(s): PROBNP in the last 8760 hours. HbA1C: No results for input(s): HGBA1C in the last 72 hours. CBG: No results for input(s): GLUCAP in the last 168 hours. Lipid Profile: No results for input(s): CHOL, HDL, LDLCALC, TRIG, CHOLHDL, LDLDIRECT in the last 72 hours. Thyroid Function Tests: No results for  input(s): TSH, T4TOTAL, FREET4, T3FREE, THYROIDAB in the last 72 hours. Anemia Panel: No results for input(s): VITAMINB12, FOLATE, FERRITIN, TIBC, IRON, RETICCTPCT in the last 72 hours. Urine analysis:    Component Value Date/Time   COLORURINE YELLOW 01/03/2016  1515   APPEARANCEUR CLEAR 01/03/2016 1515   LABSPEC 1.015 01/03/2016 1515   PHURINE 5.5 01/03/2016 1515   GLUCOSEU NEGATIVE 01/03/2016 1515   HGBUR NEGATIVE 01/03/2016 1515   BILIRUBINUR NEGATIVE 01/03/2016 1515   KETONESUR NEGATIVE 01/03/2016 1515   PROTEINUR NEGATIVE 01/03/2016 1515   NITRITE NEGATIVE 01/03/2016 1515   LEUKOCYTESUR NEGATIVE 01/03/2016 1515     Omair Dettmer M.D. [facility] 01/04/2016, 2:03 PM  Pager: [phone] Between 7am to 7pm - call Pager - [phone]  After 7pm go to [email or web address] - password [email or web address]  Call night coverage person covering after 7pm

## 2016-01-05 DIAGNOSIS — N400 Enlarged prostate without lower urinary tract symptoms: Secondary | ICD-10-CM

## 2016-01-05 DIAGNOSIS — I82403 Acute embolism and thrombosis of unspecified deep veins of lower extremity, bilateral: Secondary | ICD-10-CM

## 2016-01-05 LAB — CBC
HCT: 34.7 % — ABNORMAL LOW (ref 39.0–52.0)
HEMOGLOBIN: 11.8 g/dL — AB (ref 13.0–17.0)
MCH: 30.3 pg (ref 26.0–34.0)
MCHC: 34 g/dL (ref 30.0–36.0)
MCV: 89 fL (ref 78.0–100.0)
Platelets: 165 10*3/uL (ref 150–400)
RBC: 3.9 MIL/uL — AB (ref 4.22–5.81)
RDW: 12.5 % (ref 11.5–15.5)
WBC: 8.8 10*3/uL (ref 4.0–10.5)

## 2016-01-05 MED ORDER — RIVAROXABAN 20 MG PO TABS: 20.0000 mg | ORAL_TABLET | Freq: Every day | ORAL | 1 refills | Status: DC

## 2016-01-05 MED ORDER — RIVAROXABAN 15 MG PO TABS: 15.0000 mg | ORAL_TABLET | Freq: Two times a day (BID) | ORAL | 0 refills | Status: DC

## 2016-01-05 MED ORDER — RIVAROXABAN 15 MG PO TABS
15.0000 mg | ORAL_TABLET | Freq: Two times a day (BID) | ORAL | Status: DC
  Administered 2016-01-05 (×2): 15 mg via ORAL
  Filled 2016-01-05 (×2): qty 1

## 2016-01-05 MED ORDER — TAMSULOSIN HCL 0.4 MG PO CAPS: 0.4000 mg | ORAL_CAPSULE | Freq: Every day | ORAL | 1 refills | Status: DC

## 2016-01-05 NOTE — Care Management Note (Signed)
Case Management Note  Patient Details  Name: Dennis [name] MRN: [id number] Date of Birth: [birth year]  Subjective/Objective:       Admitted with PE/ DVT             Action/Plan: HHC choice offered to the patient with spouse present, [facility]. Patient stated that he does not need a walker, he has one at home  Expected Discharge Date:    01/05/2016              Expected Discharge Plan:  Home w Home Health Services  Discharge planning Services  CM Consult Choice offered to:  Patient, Spouse  HH Arranged:  RN, PT, Nurse's Aide [facility]  Status of Service:  In process, will continue to follow  Reola MosherChandler, Pearson Reasons L, RN,MHA,BSN [phone] 01/05/2016, 4:02 PM

## 2016-01-05 NOTE — Plan of Care (Signed)
Night RN paged because he found Heparin drip still infusing. Heparin was d/c'd around 2pm 8/22. RN stopped infusion. NP spoke with pharmD, Tammy SoursGreg, because pt had already received a dose of Xeralto on 8/22. Tammy SoursGreg stated to watch pt carefully for bleeding and Heparin should be out of system in 3 hours. Discussed plan with Marlinda Mikeallas, RN. KJKG, NP [facility]

## 2016-01-05 NOTE — Discharge Summary (Signed)
Physician Discharge Summary  Dennis [name]:[id number] DOB: April 30, 1946 DOA: 01/01/2016  PCP: [name], MD  Admit date: 01/01/2016 Discharge date: 01/05/2016  Time spent: 35 minutes  Recommendations for Outpatient Follow-up:  Repeat CBC to follow Hgb trend Repeat BMET to follow electrolytes and renal function  Please arrange outpatient follow up with Thoracic surgery and reevaluation with CT angio or MRA every 6 months to assess progression of aortic aneurysm. Please arrange follow up with urology for further evaluation and and management of BPH.  Discharge Diagnoses:  Active Problems:   Hyperlipidemia   Chronic systolic heart failure (HCC)   CAD (coronary artery disease), native coronary artery   HTN   Mild dementia   Acute pulmonary embolism (HCC)   BPH (benign prostatic hyperplasia)   Discharge Condition: stable and improved. Will discharge home with home health services for PT and instructions to see PCP in 10 days.  Diet recommendation: heart healthy    Filed Weights   01/01/16 1715 01/02/16 0427 01/05/16 0455  Weight: 78.4 kg (172 lb 14.4 oz) 75.3 kg (166 lb) 78.2 kg (172 lb 4.8 oz)    History of present illness:  As per H&P written by Dr. Dartha Lodge on 01/01/16; but briefly, 70 year old male with history of dementia, CHF with EF of 30-35%, pulmonary HTN, CAD, CVA, HTN and HL. Patient presents with back pain and SOB that started last night. No history of recent surgery or travel, but patient is said to be largely sedentary at home. CTA of Chest done presentation revealed Bilateral pulmonary embolism, worse on the left. 4.5 CM ascending Thoracic aortic aneurysm was also noted on CTA Chest. EKG revealed poor progression of R wave. Mild leukocytosis is noted, with compression atelectasis and mild pleural effusion (Likely secondary to the PE). No headache, no neck pain, no fever or chills, no GI symptoms or urinary symptoms  Hospital Course:  Acute bilateral pulmonary  embolism, Bilateral DVT - Currently no chest pain or shortness of breath -good O2 sat on RA -CT angiogram of the chest showed bilateral lower lobe pulmonary emboli more significantly involving the left lower lobe. -initially treated with IV heparin. Now w/o signs of RV strain or cor pulmonale  - 2-D echo showed EF of 45% with akinesis of basal inferior myocardium, grade 1 diastolic dysfunction. -will discharge on Xarelto (treatment anticipated for 3-6 months)  Ascending thoracic aortic aneurysm - CTA of the chest also showed incidental finding of ascending thoracic aortic aneurysm, 4.5 cm. Recommended semiannual/6 months imaging followed up by CT angiogram or MRA and referred to cardiothoracic surgery.  -Will recommend CT surgery outpatient appointment at discharge.  Dementia - Currently stable without acute issues, continue Zoloft, Aricept   Hx of Coronary  artery disease With pulmonary hypertension and chronic systolic heart failure - Currently no chest pain. 2-D echo from 2013 had shown EF of 30-35%, elevated PA pressure 74  - Repeat 2-D echo 8/17 shows improved EF of 45%. - Continue aspirin, Coreg, lisinopril, statin -condition is compensated and patient is euvolemic    Hypertension - Currently stable  - Continue Coreg, lisinopril, amlodipine   Hyperlipidemia - Continue statin   BPH with mild urinary retention symptoms  -continue flomax -no foley need at discharge -will recommend outpatient follow up with urology   Procedures:  See below for x-ray reports   LE duplex: - Superficial thrombosis noted in the left GSV, in it&'s entirety,   extending up to saphenofemoral junction - partially into left  common femoral vein (DVT).   Non-occlusive DVT also noted in the right popliteal vein, left   distal femoral vein, and left popliteal vein. - No evidence of Baker&'s cyst on the right or left.   2-D echo  - Left ventricle: The cavity size was normal. There was mild  focal   basal hypertrophy of the septum. Systolic function was mildly   reduced. The estimated ejection fraction was 45%. There is   akinesis of the basalinferior myocardium. Doppler parameters are   consistent with abnormal left ventricular relaxation (grade 1   diastolic dysfunction). - Aortic valve: There was mild regurgitation. - Ascending aorta: The ascending aorta was mildly dilated. - Mitral valve: Calcified annulus. There was mild regurgitation. - Left atrium: The atrium was moderately dilated. - Right atrium: Central venous pressure (est): 3 mm Hg. - Tricuspid valve: There was trivial regurgitation. - Pulmonary arteries: Systolic pressure could not be accurately   estimated. - Pericardium, extracardiac: There was no pericardial effusion.  Impressions: - Mild basal septal LV hypertrophy with LVEF approximately 45%.   Basal inferior wall akinesis. Grade 1 diastolic dysfunction.   Moderate left atrial enlargement. MAC with mild mitral   regurgitation. Dilated ascending aorta. Trivial tricuspid   regurgitation. No pericardial effusion.  Consultations:  None   Discharge Exam: Vitals:   01/05/16 1028 01/05/16 1214  BP: 128/76 98/66  Pulse: 63 66  Resp:  20  Temp: 98.6 F (37 C) 98 F (36.7 C)    General: afebrile, no CP, no SOB Cardiovascular: regular rate, no rubs or gallops Respiratory: good air movement bilaterally, no wheezing, no crackles Abd: soft, NT, ND, positive BS Extremities: no edema, no cyanosis   Discharge Instructions   Discharge Instructions    Diet - low sodium heart healthy    Complete by:  As directed   Discharge instructions    Complete by:  As directed   Heart healthy diet Take medications as prescribed Please follow up with PCP in 10 days Keep yourself well hydrated Watch urine and stools for any potential bleeding (as you are taking anticoagulation now)   Increase activity slowly    Complete by:  As directed     Current Discharge  Medication List    START taking these medications   Details  !! Rivaroxaban (XARELTO) 15 MG TABS tablet Take 1 tablet (15 mg total) by mouth 2 (two) times daily with a meal. Qty: 38 tablet, Refills: 0    !! rivaroxaban (XARELTO) 20 MG TABS tablet Take 1 tablet (20 mg total) by mouth daily with supper. Qty: 30 tablet, Refills: 1    tamsulosin (FLOMAX) 0.4 MG CAPS capsule Take 1 capsule (0.4 mg total) by mouth daily. Qty: 30 capsule, Refills: 1     !! - Potential duplicate medications found. Please discuss with provider.    CONTINUE these medications which have NOT CHANGED   Details  ALPRAZolam (XANAX) 1 MG tablet take 1/2-1 tablet by mouth three times a day if needed for anxiety Qty: 90 tablet, Refills: 0    amLODipine (NORVASC) 10 MG tablet TAKE 1/2 TO 1 TABLET BY MOUTH ONCE DAILY FO4R BLOOD PRESSURE Qty: 30 tablet, Refills: 5   Associated Diagnoses: Essential hypertension    aspirin EC 81 MG tablet Take 81 mg by mouth daily.    carvedilol (COREG) 25 MG tablet Take 1 tablet (25 mg total) by mouth 2 (two) times daily. Qty: 180 tablet, Refills: 1    Cholecalciferol (VITAMIN D) 2000 units tablet  Take 2,000 Units by mouth daily.    donepezil (ARICEPT) 10 MG tablet Take 1/2 tablet daily for 1 month, then increase to 1 tablet daily and continue Qty: 90 tablet, Refills: 3   Associated Diagnoses: Mild dementia    furosemide (LASIX) 40 MG tablet Take 1 tablet (40 mg total) by mouth daily as needed for fluid or edema. Qty: 30 tablet, Refills: 11    lisinopril (PRINIVIL,ZESTRIL) 30 MG tablet Take 1 tablet (30 mg total) by mouth daily. Qty: 90 tablet, Refills: 1   Associated Diagnoses: Essential hypertension    pravastatin (PRAVACHOL) 40 MG tablet Take 1 tablet (40 mg total) by mouth at bedtime. Qty: 90 tablet, Refills: 0    ranitidine (ZANTAC) 300 MG tablet Take twice a day for 2 weeks, then can go to once at night Qty: 60 tablet, Refills: 3    sertraline (ZOLOFT) 100 MG tablet  Take 1 tablet (100 mg total) by mouth daily. Qty: 90 tablet, Refills: 1       No Known Allergies Follow-up Information    MCKEOWN,[name] DAVID, MD Follow up in 10 day(s).   Specialty:  Internal Medicine Contact information: [address] [place] [address] [phone]           The results of significant diagnostics from this hospitalization (including imaging, microbiology, ancillary and laboratory) are listed below for reference.    Significant Diagnostic Studies: Dg Chest 2 View  Result Date: 01/01/2016 CLINICAL DATA:  Pt states hx enlarged heart. Pt c/o mid/center (around T-7) back pain off and on, worsening yesterday and today. Pt states the pain is worse when breathing; he took a pain pill yesterday and the pain did not subside. Pt is treated for HTN; non-smoker. EXAM: CHEST  2 VIEW COMPARISON:  05/01/2014 CT.  10/17/2011 chest radiograph. FINDINGS: Midline trachea. Mild cardiomegaly. Tortuous thoracic aorta. Metallic foreign object again projects within the anterior left thoracic cavity. No pleural effusion or pneumothorax. Patchy left base airspace disease is new. Multiple right-sided rib fractures, present on 05/01/2014. IMPRESSION: New left base airspace disease. Considerations include infection, atelectasis, or in the appropriate clinical setting, left-sided pulmonary embolism. Electronically Signed   By: Jeronimo Greaves M.D.   On: 01/01/2016 09:54   Ct Angio Chest Pe W And/or Wo Contrast  Result Date: 01/01/2016 CLINICAL DATA:  Central back pain since yesterday. History of enlarged heart. EXAM: CT ANGIOGRAPHY CHEST WITH CONTRAST TECHNIQUE: Multidetector CT imaging of the chest was performed using the standard protocol during bolus administration of intravenous contrast. Multiplanar CT image reconstructions and MIPs were obtained to evaluate the vascular anatomy. CONTRAST:  100 mL of Isovue 370 intravenous contrast COMPARISON:  Current chest radiographs.   Chest CT dated 05/01/2014. FINDINGS: Angiographic study: There is a near occlusive pulmonary embolus which extends from the distal left pulmonary artery into all segmental branches with the exception of the lateral segment. There is a small, nonocclusive pulmonary embolus within the posterior,basilar segmental branch to the right lower lobe. No other convincing pulmonary emboli. The aorta is dilated. Ascending aorta measures 4.5 cm in diameter. There is no significant aortic enhancement. Dissection is not evaluated. RV to LV ratio is less than 1. No evidence of right heart strain. Neck base and axilla:  No mass or adenopathy. Mediastinum and hila: Heart is mildly enlarged. There are left coronary artery calcifications. No mediastinal or hilar masses or pathologically enlarged lymph nodes. Lungs and pleural: Minimal left effusion. There is dependent atelectasis most evident in the lower  lobes, left somewhat greater than right. No convincing pneumonia. No pulmonary edema. No pneumothorax. Limited upper abdomen:  No acute findings. Musculoskeletal: Mild degenerative changes of the mid to lower thoracic spine. No osteoblastic or osteolytic lesions. Review of the MIP images confirms the above findings. IMPRESSION: 1. Bilateral lower lobe pulmonary emboli, and more significantly involving the left lower lobe as detailed above. 2. There is dependent, primary lower lobe, atelectasis, also greater on the left. No evidence of pulmonary infarction. 3. Minimal left pleural effusion. 4. No pulmonary edema or convincing pneumonia. 5. **An incidental finding of potential clinical significance has been found. Ascending thoracic aortic aneurysm. Recommend semi-annual imaging followup by CTA or MRA and referral to cardiothoracic surgery if not already obtained. This recommendation follows 2010 ACCF/AHA/AATS/ACR/ASA/SCA/SCAI/SIR/STS/SVM Guidelines for the Diagnosis and Management of Patients With Thoracic Aortic Disease. Circulation.  2010; 121: Z610-R604: [id number]** Electronically Signed   By: Amie Portlandavid  Ormond M.D.   On: 01/01/2016 15:46   [place] Renal  Result Date: 01/04/2016 CLINICAL DATA:  Urinary retention on August 19th EXAM: RENAL / URINARY TRACT ULTRASOUND COMPLETE COMPARISON:  None in PACs FINDINGS: Right Kidney: Length: 10.1 cm. The renal cortical echotexture remains lower than that of the adjacent liver. There is no splitting of the central echo complex. The renal pelvis is mildly prominent. No mass is observed. Left Kidney: Length: 11.3 cm. Echogenicity within normal limits. No mass or hydronephrosis visualized. Bladder: The partially distended urinary bladder is normal in appearance. The prostate gland is mildly prominent measuring 4.3 x 3.3 x 6.0 cm. IMPRESSION: There is no hydronephrosis. No acute abnormality of the either kidney is observed. The urinary bladder is unremarkable. The prostate gland is mildly enlarged but does not appear to produces significant impression upon the bladder base. Electronically Signed   By: David  SwazilandJordan M.D.   On: 01/04/2016 16:08   Labs: Basic Metabolic Panel:  Recent Labs Lab 01/01/16 0907 01/02/16 0255 01/04/16 2205  NA 137 138 135  K 4.7 4.0 3.6  CL 107 105 104  CO2 25 24 26   GLUCOSE 147* 143* 104*  BUN 14 10 14   CREATININE 1.34* 1.20 1.28*  CALCIUM 9.6 9.3 9.0  MG  --   --  2.0   Liver Function Tests:  Recent Labs Lab 01/01/16 1103  AST 17  ALT 15*  ALKPHOS 69  BILITOT 1.1  PROT 6.8  ALBUMIN 3.4*    Recent Labs Lab 01/01/16 1103  LIPASE 26   CBC:  Recent Labs Lab 01/01/16 0907 01/02/16 0255 01/03/16 0553 01/04/16 0340 01/05/16 0314  WBC 14.6* 11.9* 10.9* 9.2 8.8  HGB 14.1 12.2* 11.7* 12.2* 11.8*  HCT 41.8 36.5* 33.5* 35.8* 34.7*  MCV 92.3 91.0 88.4 89.3 89.0  PLT 161 130* 123* 133* 165    Signed:  Vassie LollMadera, Phala Schraeder MD.  [facility] 01/05/2016, 3:35 PM

## 2016-01-05 NOTE — Progress Notes (Signed)
Xarelto coupon card given to patient for a 30 supply of medication. Instructions given to pt with his spouse present. Abelino DerrickB Tonjua Rossetti Hendricks Regional HealthRN,MHA,BSN [phone]

## 2016-01-05 NOTE — Progress Notes (Signed)
Patient alert oriented, forgetful due to hx. Of dementia. Iv and tele d/c. D/c instruction explain and given to the patient daughter, all question answered, family verbalized understanding. Patient d/c home per oreder.

## 2016-01-05 NOTE — Evaluation (Signed)
Physical Therapy Evaluation Patient Details Name: Dennis [name] MRN: [id number] DOB: 07-21-1945 Today's Date: 01/05/2016   History of Present Illness  70 year old male with history of dementia, CHF with EF of 30-35%, pulmonary HTN, CAD, CVA, HTN and HL. Patient presents with back pain and SOB. No history of recent surgery or travel, but patient is said to be largely sedentary at home. CTA of Chest done presentation revealed Bilateral pulmonary embolism,  Clinical Impression  Pt admitted with above diagnosis. Pt currently with functional limitations due to the deficits listed below (see PT Problem List).  Pt will benefit from skilled PT to increase their independence and safety with mobility to allow discharge to the venue listed below.  Due to dementia, pt unable to give consistent answers about current DME. He will need a RW if he does not have one at home. He states his daughter or grandson is always home.  If this is true and he has 24 hour A, then home with HHPT is a safe d/c.      Follow Up Recommendations Home health PT;Supervision/Assistance - 24 hour    Equipment Recommendations  Rolling walker with 5" wheels (if he doesn't have one at home)    Recommendations for Other Services       Precautions / Restrictions Precautions Precautions: Fall Restrictions Weight Bearing Restrictions: No      Mobility  Bed Mobility               General bed mobility comments: sitting up in recliner upon arrival  Transfers Overall transfer level: Needs assistance Equipment used: Rolling walker (2 wheeled) Transfers: Sit to/from Stand Sit to Stand: Min guard;Min assist         General transfer comment: Multiple reps with MIN A to MIN/guard.  Shaky upon standing that improved with each rep.  Ambulation/Gait Ambulation/Gait assistance: Min guard;Min assist Ambulation Distance (Feet): 180 Feet Assistive device: Rolling walker (2 wheeled) Gait Pattern/deviations: Decreased step  length - right;Drifts right/left;Ataxic     General Gait Details: MIN A in tight spaces and poor processing on RW management. Once he was in hallway, he is able to ambulate with MIN/guard to MIN A with difficulty with obstacle negotiation.   Stairs            Wheelchair Mobility    Modified Rankin (Stroke Patients Only)       Balance Overall balance assessment: Needs assistance   Sitting balance-Leahy Scale: Fair       Standing balance-Leahy Scale: Poor Standing balance comment: requires UE support                             Pertinent Vitals/Pain Pain Assessment: No/denies pain    Home Living Family/patient expects to be discharged to:: Private residence Living Arrangements: Children Available Help at Discharge: Family;Available PRN/intermittently Type of Home: House Home Access: Level entry     Home Layout: One level Home Equipment:  (unable to clarify)      Prior Function Level of Independence: Independent         Comments: Reports he exercises every day.  Waiting for it to cool down before returning to walking, although chart states he is sedentary at home and has hx of dementia.     Hand Dominance        Extremity/Trunk Assessment   Upper Extremity Assessment: Overall WFL for tasks assessed  Lower Extremity Assessment: Generalized weakness;Overall [facility] Center[facility]FL for tasks assessed      Cervical / Trunk Assessment: Normal  Communication   Communication: No difficulties  Cognition Arousal/Alertness: Awake/alert Behavior During Therapy: WFL for tasks assessed/performed Overall Cognitive Status: History of cognitive impairments - at baseline Area of Impairment: Problem solving;Following commands;Memory;Orientation Orientation Level: Disoriented to;Time   Memory: Decreased recall of precautions Following Commands: Follows one step commands with increased time     Problem Solving: Difficulty sequencing;Slow processing       General Comments      Exercises        Assessment/Plan    PT Assessment Patient needs continued PT services  PT Diagnosis Difficulty walking;Generalized weakness   PT Problem List Decreased strength;Decreased activity tolerance;Decreased mobility;Decreased safety awareness;Decreased cognition;Decreased balance;Decreased knowledge of use of DME  PT Treatment Interventions DME instruction;Gait training;Functional mobility training;Therapeutic exercise;Therapeutic activities;Balance training   PT Goals (Current goals can be found in the Care Plan section) Acute Rehab PT Goals Patient Stated Goal: go home PT Goal Formulation: With patient Time For Goal Achievement: 01/12/16 Potential to Achieve Goals: Good    Frequency Min 3X/week   Barriers to discharge        Co-evaluation               End of Session Equipment Utilized During Treatment: Gait belt Activity Tolerance: Patient tolerated treatment well Patient left: in chair;with call bell/phone within reach;with chair alarm set Nurse Communication: Mobility status         Time: [id number] PT Time Calculation (min) (ACUTE ONLY): 19 min   Charges:   PT Evaluation $PT Eval Moderate Complexity: 1 Procedure     PT G Codes:        Darcia Lampi LUBECK 01/05/2016, 12:52 PM

## 2016-01-05 NOTE — Progress Notes (Signed)
ANTICOAGULATION CONSULT NOTE - Follow Up Consult  Pharmacy Consult for Xarelto Indication: pulmonary embolus and DVT  SZP filed for ADE noted earlier this morning. No bleeding complications noted. Hgb  Remains stable, platelets trending up. Heparin should be out of system now.  Pharmacy signing off.  Loura BackJennifer [place], PharmD, BCPS Clinical Pharmacist Phone for today [phone] Main pharmacy - [phone] 01/05/2016 11:39 AM

## 2016-01-09 DIAGNOSIS — I2699 Other pulmonary embolism without acute cor pulmonale: Secondary | ICD-10-CM | POA: Diagnosis not present

## 2016-01-09 DIAGNOSIS — I5022 Chronic systolic (congestive) heart failure: Secondary | ICD-10-CM | POA: Diagnosis not present

## 2016-01-09 DIAGNOSIS — I2510 Atherosclerotic heart disease of native coronary artery without angina pectoris: Secondary | ICD-10-CM | POA: Diagnosis not present

## 2016-01-09 DIAGNOSIS — I110 Hypertensive heart disease with heart failure: Secondary | ICD-10-CM | POA: Diagnosis not present

## 2016-01-09 DIAGNOSIS — F0390 Unspecified dementia without behavioral disturbance: Secondary | ICD-10-CM | POA: Diagnosis not present

## 2016-01-09 DIAGNOSIS — I712 Thoracic aortic aneurysm, without rupture: Secondary | ICD-10-CM | POA: Diagnosis not present

## 2016-01-11 ENCOUNTER — Encounter: Payer: Self-pay | Admitting: Internal Medicine

## 2016-01-11 ENCOUNTER — Ambulatory Visit (INDEPENDENT_AMBULATORY_CARE_PROVIDER_SITE_OTHER): Payer: Commercial Managed Care - HMO | Admitting: Internal Medicine

## 2016-01-11 VITALS — BP 114/64 | HR 64 | Temp 97.3°F | Resp 16 | Ht 67.0 in | Wt 173.6 lb

## 2016-01-11 DIAGNOSIS — I2699 Other pulmonary embolism without acute cor pulmonale: Secondary | ICD-10-CM | POA: Diagnosis not present

## 2016-01-11 DIAGNOSIS — I712 Thoracic aortic aneurysm, without rupture, unspecified: Secondary | ICD-10-CM

## 2016-01-11 DIAGNOSIS — I2510 Atherosclerotic heart disease of native coronary artery without angina pectoris: Secondary | ICD-10-CM | POA: Diagnosis not present

## 2016-01-11 DIAGNOSIS — Z130 Encounter for screening for diseases of the blood and blood-forming organs and certain disorders involving the immune mechanism: Secondary | ICD-10-CM | POA: Diagnosis not present

## 2016-01-11 DIAGNOSIS — I10 Essential (primary) hypertension: Secondary | ICD-10-CM | POA: Diagnosis not present

## 2016-01-11 DIAGNOSIS — I82409 Acute embolism and thrombosis of unspecified deep veins of unspecified lower extremity: Secondary | ICD-10-CM | POA: Diagnosis not present

## 2016-01-11 DIAGNOSIS — I82402 Acute embolism and thrombosis of unspecified deep veins of left lower extremity: Secondary | ICD-10-CM

## 2016-01-11 DIAGNOSIS — D6859 Other primary thrombophilia: Secondary | ICD-10-CM | POA: Diagnosis not present

## 2016-01-11 DIAGNOSIS — Z79899 Other long term (current) drug therapy: Secondary | ICD-10-CM

## 2016-01-11 LAB — CBC WITH DIFFERENTIAL/PLATELET
BASOS ABS: 81 {cells}/uL (ref 0–200)
Basophils Relative: 1 %
EOS ABS: 243 {cells}/uL (ref 15–500)
EOS PCT: 3 %
HCT: 37.7 % — ABNORMAL LOW (ref 38.5–50.0)
Hemoglobin: 12.7 g/dL — ABNORMAL LOW (ref 13.2–17.1)
LYMPHS PCT: 22 %
Lymphs Abs: 1782 cells/uL (ref 850–3900)
MCH: 30.7 pg (ref 27.0–33.0)
MCHC: 33.7 g/dL (ref 32.0–36.0)
MCV: 91.1 fL (ref 80.0–100.0)
MONOS PCT: 11 %
MPV: 9.2 fL (ref 7.5–12.5)
Monocytes Absolute: 891 cells/uL (ref 200–950)
NEUTROS ABS: 5103 {cells}/uL (ref 1500–7800)
NEUTROS PCT: 63 %
PLATELETS: 239 10*3/uL (ref 140–400)
RBC: 4.14 MIL/uL — ABNORMAL LOW (ref 4.20–5.80)
RDW: 13.6 % (ref 11.0–15.0)
WBC: 8.1 10*3/uL (ref 3.8–10.8)

## 2016-01-11 NOTE — Patient Instructions (Signed)
    This information is not intended to replace advice given to you by your health care provider. Make sure you discuss any questions you have with your health care provider.   Document Released: 04/09/2006 Document Revised: 09/15/2014 Document Reviewed: 10/10/2011 Elsevier Interactive Patient Education [employer]2016 [employer]
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
## 2016-01-12 DIAGNOSIS — I2699 Other pulmonary embolism without acute cor pulmonale: Secondary | ICD-10-CM | POA: Diagnosis not present

## 2016-01-12 DIAGNOSIS — I2510 Atherosclerotic heart disease of native coronary artery without angina pectoris: Secondary | ICD-10-CM | POA: Diagnosis not present

## 2016-01-12 DIAGNOSIS — F0390 Unspecified dementia without behavioral disturbance: Secondary | ICD-10-CM | POA: Diagnosis not present

## 2016-01-12 DIAGNOSIS — I5022 Chronic systolic (congestive) heart failure: Secondary | ICD-10-CM | POA: Diagnosis not present

## 2016-01-12 DIAGNOSIS — I712 Thoracic aortic aneurysm, without rupture: Secondary | ICD-10-CM | POA: Diagnosis not present

## 2016-01-12 DIAGNOSIS — I110 Hypertensive heart disease with heart failure: Secondary | ICD-10-CM | POA: Diagnosis not present

## 2016-01-12 LAB — BASIC METABOLIC PANEL WITH GFR
BUN: 23 mg/dL (ref 7–25)
CALCIUM: 9.3 mg/dL (ref 8.6–10.3)
CHLORIDE: 108 mmol/L (ref 98–110)
CO2: 26 mmol/L (ref 20–31)
CREATININE: 1.43 mg/dL — AB (ref 0.70–1.25)
GFR, Est African American: 58 mL/min — ABNORMAL LOW (ref >=60)
GFR, Est Non African American: 50 mL/min — ABNORMAL LOW (ref >=60)
Glucose, Bld: 64 mg/dL — ABNORMAL LOW (ref 65–99)
Potassium: 4.8 mmol/L (ref 3.5–5.3)
SODIUM: 143 mmol/L (ref 135–146)

## 2016-01-12 LAB — HOMOCYSTEINE: HOMOCYSTEINE: 13.5 umol/L — AB (ref <11.4)

## 2016-01-14 LAB — RFLX DRVVT CONFRIM: DRVVT CONFIRMATION: NEGATIVE

## 2016-01-14 LAB — ANTITHROMBIN III: ANTITHROMB III FUNC: 118 %{activity} (ref 80–120)

## 2016-01-14 LAB — PROTEIN C ACTIVITY: Protein C Activity: 115 % (ref 70–180)

## 2016-01-14 LAB — CARDIOLIPIN ANTIBODIES, IGG, IGM, IGA
ANTICARDIOLIPIN IGM: 13 [MPL'U] — AB
Anticardiolipin IgG: <14 [GPL'U]

## 2016-01-14 LAB — RFX PTT-LA W/RFX TO HEX PHASE CONF: PTT-LA Screen: 56 s — ABNORMAL HIGH (ref <=40)

## 2016-01-14 LAB — RFX DRVVT SCR W/RFLX CONF 1:1 MIX: DRVVT SCREEN: 75 s — AB (ref <=45)

## 2016-01-14 LAB — RFLX HEXAGONAL PHASE CONFIRM: HEXAGONAL PHASE CONFIRM: NEGATIVE

## 2016-01-14 LAB — PROTEIN S, TOTAL: PROTEIN S ANTIGEN, TOTAL: 129 % (ref 70–140)

## 2016-01-14 LAB — PROTEIN C, TOTAL: Protein C Antigen: 88 % (ref 70–140)

## 2016-01-14 LAB — PROTEIN S ACTIVITY: Protein S Activity: 118 % (ref 70–150)

## 2016-01-14 LAB — LUPUS ANTICOAGULANT PANEL

## 2016-01-15 LAB — FACTOR 5 LEIDEN

## 2016-01-15 LAB — PROTHROMBIN GENE MUTATION

## 2016-01-18 ENCOUNTER — Other Ambulatory Visit: Payer: Self-pay | Admitting: Internal Medicine

## 2016-01-18 DIAGNOSIS — F0390 Unspecified dementia without behavioral disturbance: Secondary | ICD-10-CM | POA: Diagnosis not present

## 2016-01-18 DIAGNOSIS — I5022 Chronic systolic (congestive) heart failure: Secondary | ICD-10-CM | POA: Diagnosis not present

## 2016-01-18 DIAGNOSIS — I712 Thoracic aortic aneurysm, without rupture: Secondary | ICD-10-CM | POA: Diagnosis not present

## 2016-01-18 DIAGNOSIS — I110 Hypertensive heart disease with heart failure: Secondary | ICD-10-CM | POA: Diagnosis not present

## 2016-01-18 DIAGNOSIS — Z86718 Personal history of other venous thrombosis and embolism: Secondary | ICD-10-CM | POA: Insufficient documentation

## 2016-01-18 DIAGNOSIS — I2510 Atherosclerotic heart disease of native coronary artery without angina pectoris: Secondary | ICD-10-CM | POA: Diagnosis not present

## 2016-01-18 DIAGNOSIS — I2699 Other pulmonary embolism without acute cor pulmonale: Secondary | ICD-10-CM | POA: Diagnosis not present

## 2016-01-19 DIAGNOSIS — I5022 Chronic systolic (congestive) heart failure: Secondary | ICD-10-CM | POA: Diagnosis not present

## 2016-01-19 DIAGNOSIS — I712 Thoracic aortic aneurysm, without rupture: Secondary | ICD-10-CM | POA: Diagnosis not present

## 2016-01-19 DIAGNOSIS — I110 Hypertensive heart disease with heart failure: Secondary | ICD-10-CM | POA: Diagnosis not present

## 2016-01-19 DIAGNOSIS — I2510 Atherosclerotic heart disease of native coronary artery without angina pectoris: Secondary | ICD-10-CM | POA: Diagnosis not present

## 2016-01-19 DIAGNOSIS — I2699 Other pulmonary embolism without acute cor pulmonale: Secondary | ICD-10-CM | POA: Diagnosis not present

## 2016-01-19 DIAGNOSIS — F0390 Unspecified dementia without behavioral disturbance: Secondary | ICD-10-CM | POA: Diagnosis not present

## 2016-01-20 ENCOUNTER — Encounter: Payer: Commercial Managed Care - HMO | Admitting: Cardiothoracic Surgery

## 2016-01-20 DIAGNOSIS — I2510 Atherosclerotic heart disease of native coronary artery without angina pectoris: Secondary | ICD-10-CM | POA: Diagnosis not present

## 2016-01-20 DIAGNOSIS — F0390 Unspecified dementia without behavioral disturbance: Secondary | ICD-10-CM | POA: Diagnosis not present

## 2016-01-20 DIAGNOSIS — I712 Thoracic aortic aneurysm, without rupture: Secondary | ICD-10-CM | POA: Diagnosis not present

## 2016-01-20 DIAGNOSIS — I2699 Other pulmonary embolism without acute cor pulmonale: Secondary | ICD-10-CM | POA: Diagnosis not present

## 2016-01-20 DIAGNOSIS — I5022 Chronic systolic (congestive) heart failure: Secondary | ICD-10-CM | POA: Diagnosis not present

## 2016-01-20 DIAGNOSIS — I110 Hypertensive heart disease with heart failure: Secondary | ICD-10-CM | POA: Diagnosis not present

## 2016-01-21 DIAGNOSIS — F0390 Unspecified dementia without behavioral disturbance: Secondary | ICD-10-CM | POA: Diagnosis not present

## 2016-01-21 DIAGNOSIS — I5022 Chronic systolic (congestive) heart failure: Secondary | ICD-10-CM | POA: Diagnosis not present

## 2016-01-21 DIAGNOSIS — I712 Thoracic aortic aneurysm, without rupture: Secondary | ICD-10-CM | POA: Diagnosis not present

## 2016-01-21 DIAGNOSIS — I2699 Other pulmonary embolism without acute cor pulmonale: Secondary | ICD-10-CM | POA: Diagnosis not present

## 2016-01-21 DIAGNOSIS — I110 Hypertensive heart disease with heart failure: Secondary | ICD-10-CM | POA: Diagnosis not present

## 2016-01-21 DIAGNOSIS — I2510 Atherosclerotic heart disease of native coronary artery without angina pectoris: Secondary | ICD-10-CM | POA: Diagnosis not present

## 2016-01-24 DIAGNOSIS — F0390 Unspecified dementia without behavioral disturbance: Secondary | ICD-10-CM | POA: Diagnosis not present

## 2016-01-24 DIAGNOSIS — I712 Thoracic aortic aneurysm, without rupture: Secondary | ICD-10-CM | POA: Diagnosis not present

## 2016-01-24 DIAGNOSIS — I110 Hypertensive heart disease with heart failure: Secondary | ICD-10-CM | POA: Diagnosis not present

## 2016-01-24 DIAGNOSIS — I5022 Chronic systolic (congestive) heart failure: Secondary | ICD-10-CM | POA: Diagnosis not present

## 2016-01-24 DIAGNOSIS — I2510 Atherosclerotic heart disease of native coronary artery without angina pectoris: Secondary | ICD-10-CM | POA: Diagnosis not present

## 2016-01-24 DIAGNOSIS — I2699 Other pulmonary embolism without acute cor pulmonale: Secondary | ICD-10-CM | POA: Diagnosis not present

## 2016-01-25 ENCOUNTER — Encounter: Payer: Commercial Managed Care - HMO | Admitting: Cardiothoracic Surgery

## 2016-01-27 ENCOUNTER — Encounter: Payer: Self-pay | Admitting: Cardiothoracic Surgery

## 2016-01-27 ENCOUNTER — Institutional Professional Consult (permissible substitution) (INDEPENDENT_AMBULATORY_CARE_PROVIDER_SITE_OTHER): Payer: Commercial Managed Care - HMO | Admitting: Cardiothoracic Surgery

## 2016-01-27 VITALS — BP 110/73 | HR 64 | Resp 16 | Ht 67.0 in | Wt 173.0 lb

## 2016-01-27 DIAGNOSIS — I712 Thoracic aortic aneurysm, without rupture, unspecified: Secondary | ICD-10-CM

## 2016-01-27 DIAGNOSIS — I110 Hypertensive heart disease with heart failure: Secondary | ICD-10-CM | POA: Diagnosis not present

## 2016-01-27 DIAGNOSIS — I5022 Chronic systolic (congestive) heart failure: Secondary | ICD-10-CM | POA: Diagnosis not present

## 2016-01-27 DIAGNOSIS — I2510 Atherosclerotic heart disease of native coronary artery without angina pectoris: Secondary | ICD-10-CM | POA: Diagnosis not present

## 2016-01-27 DIAGNOSIS — F0390 Unspecified dementia without behavioral disturbance: Secondary | ICD-10-CM | POA: Diagnosis not present

## 2016-01-27 DIAGNOSIS — I2699 Other pulmonary embolism without acute cor pulmonale: Secondary | ICD-10-CM | POA: Diagnosis not present
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
## 2016-01-29 ENCOUNTER — Other Ambulatory Visit: Payer: Self-pay | Admitting: Internal Medicine

## 2016-01-29 DIAGNOSIS — D6859 Other primary thrombophilia: Secondary | ICD-10-CM | POA: Insufficient documentation

## 2016-01-31 DIAGNOSIS — F0390 Unspecified dementia without behavioral disturbance: Secondary | ICD-10-CM | POA: Diagnosis not present

## 2016-01-31 DIAGNOSIS — I110 Hypertensive heart disease with heart failure: Secondary | ICD-10-CM | POA: Diagnosis not present

## 2016-01-31 DIAGNOSIS — I2510 Atherosclerotic heart disease of native coronary artery without angina pectoris: Secondary | ICD-10-CM | POA: Diagnosis not present

## 2016-01-31 DIAGNOSIS — I5022 Chronic systolic (congestive) heart failure: Secondary | ICD-10-CM | POA: Diagnosis not present

## 2016-01-31 DIAGNOSIS — I712 Thoracic aortic aneurysm, without rupture: Secondary | ICD-10-CM | POA: Diagnosis not present

## 2016-01-31 DIAGNOSIS — I2699 Other pulmonary embolism without acute cor pulmonale: Secondary | ICD-10-CM | POA: Diagnosis not present

## 2016-02-02 ENCOUNTER — Other Ambulatory Visit: Payer: Self-pay | Admitting: *Deleted

## 2016-02-02 MED ORDER — RIVAROXABAN 20 MG PO TABS: 20.0000 mg | ORAL_TABLET | Freq: Every day | ORAL | 3 refills | Status: DC

## 2016-02-03 DIAGNOSIS — I110 Hypertensive heart disease with heart failure: Secondary | ICD-10-CM | POA: Diagnosis not present

## 2016-02-03 DIAGNOSIS — I712 Thoracic aortic aneurysm, without rupture: Secondary | ICD-10-CM | POA: Diagnosis not present

## 2016-02-03 DIAGNOSIS — F0390 Unspecified dementia without behavioral disturbance: Secondary | ICD-10-CM | POA: Diagnosis not present

## 2016-02-03 DIAGNOSIS — I2699 Other pulmonary embolism without acute cor pulmonale: Secondary | ICD-10-CM | POA: Diagnosis not present

## 2016-02-03 DIAGNOSIS — I5022 Chronic systolic (congestive) heart failure: Secondary | ICD-10-CM | POA: Diagnosis not present

## 2016-02-03 DIAGNOSIS — I2510 Atherosclerotic heart disease of native coronary artery without angina pectoris: Secondary | ICD-10-CM | POA: Diagnosis not present

## 2016-02-08 DIAGNOSIS — I110 Hypertensive heart disease with heart failure: Secondary | ICD-10-CM | POA: Diagnosis not present

## 2016-02-08 DIAGNOSIS — I2510 Atherosclerotic heart disease of native coronary artery without angina pectoris: Secondary | ICD-10-CM | POA: Diagnosis not present

## 2016-02-08 DIAGNOSIS — I5022 Chronic systolic (congestive) heart failure: Secondary | ICD-10-CM | POA: Diagnosis not present

## 2016-02-08 DIAGNOSIS — I712 Thoracic aortic aneurysm, without rupture: Secondary | ICD-10-CM | POA: Diagnosis not present

## 2016-02-08 DIAGNOSIS — I2699 Other pulmonary embolism without acute cor pulmonale: Secondary | ICD-10-CM | POA: Diagnosis not present

## 2016-02-08 DIAGNOSIS — F0390 Unspecified dementia without behavioral disturbance: Secondary | ICD-10-CM | POA: Diagnosis not present

## 2016-02-09 DIAGNOSIS — I5022 Chronic systolic (congestive) heart failure: Secondary | ICD-10-CM | POA: Diagnosis not present

## 2016-02-09 DIAGNOSIS — I712 Thoracic aortic aneurysm, without rupture: Secondary | ICD-10-CM | POA: Diagnosis not present

## 2016-02-09 DIAGNOSIS — I2699 Other pulmonary embolism without acute cor pulmonale: Secondary | ICD-10-CM | POA: Diagnosis not present

## 2016-02-09 DIAGNOSIS — I2510 Atherosclerotic heart disease of native coronary artery without angina pectoris: Secondary | ICD-10-CM | POA: Diagnosis not present

## 2016-02-09 DIAGNOSIS — F0390 Unspecified dementia without behavioral disturbance: Secondary | ICD-10-CM | POA: Diagnosis not present

## 2016-02-09 DIAGNOSIS — I110 Hypertensive heart disease with heart failure: Secondary | ICD-10-CM | POA: Diagnosis not present

## 2016-02-10 ENCOUNTER — Other Ambulatory Visit: Payer: Self-pay | Admitting: Internal Medicine

## 2016-02-10 DIAGNOSIS — I2699 Other pulmonary embolism without acute cor pulmonale: Secondary | ICD-10-CM | POA: Diagnosis not present

## 2016-02-10 DIAGNOSIS — I2510 Atherosclerotic heart disease of native coronary artery without angina pectoris: Secondary | ICD-10-CM | POA: Diagnosis not present

## 2016-02-10 DIAGNOSIS — F0390 Unspecified dementia without behavioral disturbance: Secondary | ICD-10-CM | POA: Diagnosis not present

## 2016-02-10 DIAGNOSIS — I110 Hypertensive heart disease with heart failure: Secondary | ICD-10-CM | POA: Diagnosis not present

## 2016-02-10 DIAGNOSIS — I712 Thoracic aortic aneurysm, without rupture: Secondary | ICD-10-CM | POA: Diagnosis not present

## 2016-02-10 DIAGNOSIS — I5022 Chronic systolic (congestive) heart failure: Secondary | ICD-10-CM | POA: Diagnosis not present

## 2016-02-15 ENCOUNTER — Ambulatory Visit: Payer: Self-pay | Admitting: Internal Medicine

## 2016-02-15 DIAGNOSIS — F0390 Unspecified dementia without behavioral disturbance: Secondary | ICD-10-CM | POA: Diagnosis not present

## 2016-02-15 DIAGNOSIS — I712 Thoracic aortic aneurysm, without rupture: Secondary | ICD-10-CM | POA: Diagnosis not present

## 2016-02-15 DIAGNOSIS — I5022 Chronic systolic (congestive) heart failure: Secondary | ICD-10-CM | POA: Diagnosis not present

## 2016-02-15 DIAGNOSIS — I2510 Atherosclerotic heart disease of native coronary artery without angina pectoris: Secondary | ICD-10-CM | POA: Diagnosis not present

## 2016-02-15 DIAGNOSIS — I2699 Other pulmonary embolism without acute cor pulmonale: Secondary | ICD-10-CM | POA: Diagnosis not present

## 2016-02-15 DIAGNOSIS — I110 Hypertensive heart disease with heart failure: Secondary | ICD-10-CM | POA: Diagnosis not present

## 2016-02-17 DIAGNOSIS — I110 Hypertensive heart disease with heart failure: Secondary | ICD-10-CM | POA: Diagnosis not present

## 2016-02-17 DIAGNOSIS — I5022 Chronic systolic (congestive) heart failure: Secondary | ICD-10-CM | POA: Diagnosis not present

## 2016-02-17 DIAGNOSIS — I2699 Other pulmonary embolism without acute cor pulmonale: Secondary | ICD-10-CM | POA: Diagnosis not present

## 2016-02-17 DIAGNOSIS — I712 Thoracic aortic aneurysm, without rupture: Secondary | ICD-10-CM | POA: Diagnosis not present

## 2016-02-17 DIAGNOSIS — I2510 Atherosclerotic heart disease of native coronary artery without angina pectoris: Secondary | ICD-10-CM | POA: Diagnosis not present

## 2016-02-17 DIAGNOSIS — F0390 Unspecified dementia without behavioral disturbance: Secondary | ICD-10-CM | POA: Diagnosis not present

## 2016-02-22 ENCOUNTER — Other Ambulatory Visit: Payer: Self-pay | Admitting: Internal Medicine

## 2016-02-22 DIAGNOSIS — I2510 Atherosclerotic heart disease of native coronary artery without angina pectoris: Secondary | ICD-10-CM | POA: Diagnosis not present

## 2016-02-22 DIAGNOSIS — F0390 Unspecified dementia without behavioral disturbance: Secondary | ICD-10-CM | POA: Diagnosis not present

## 2016-02-22 DIAGNOSIS — I2699 Other pulmonary embolism without acute cor pulmonale: Secondary | ICD-10-CM | POA: Diagnosis not present

## 2016-02-22 DIAGNOSIS — I5022 Chronic systolic (congestive) heart failure: Secondary | ICD-10-CM | POA: Diagnosis not present

## 2016-02-22 DIAGNOSIS — Z130 Encounter for screening for diseases of the blood and blood-forming organs and certain disorders involving the immune mechanism: Secondary | ICD-10-CM

## 2016-02-22 DIAGNOSIS — I712 Thoracic aortic aneurysm, without rupture: Secondary | ICD-10-CM | POA: Diagnosis not present

## 2016-02-22 DIAGNOSIS — I110 Hypertensive heart disease with heart failure: Secondary | ICD-10-CM | POA: Diagnosis not present

## 2016-02-24 DIAGNOSIS — I712 Thoracic aortic aneurysm, without rupture: Secondary | ICD-10-CM | POA: Diagnosis not present

## 2016-02-24 DIAGNOSIS — F0390 Unspecified dementia without behavioral disturbance: Secondary | ICD-10-CM | POA: Diagnosis not present

## 2016-02-24 DIAGNOSIS — I5022 Chronic systolic (congestive) heart failure: Secondary | ICD-10-CM | POA: Diagnosis not present

## 2016-02-24 DIAGNOSIS — I110 Hypertensive heart disease with heart failure: Secondary | ICD-10-CM | POA: Diagnosis not present

## 2016-02-24 DIAGNOSIS — I2699 Other pulmonary embolism without acute cor pulmonale: Secondary | ICD-10-CM | POA: Diagnosis not present

## 2016-02-24 DIAGNOSIS — I2510 Atherosclerotic heart disease of native coronary artery without angina pectoris: Secondary | ICD-10-CM | POA: Diagnosis not present

## 2016-03-10 ENCOUNTER — Other Ambulatory Visit: Payer: Self-pay | Admitting: Internal Medicine

## 2016-03-20 ENCOUNTER — Other Ambulatory Visit: Payer: Self-pay | Admitting: *Deleted

## 2016-03-20 MED ORDER — TAMSULOSIN HCL 0.4 MG PO CAPS: 0.4000 mg | ORAL_CAPSULE | Freq: Every day | ORAL | 1 refills | Status: DC

## 2016-03-22 ENCOUNTER — Encounter: Payer: Self-pay | Admitting: Internal Medicine

## 2016-03-22 ENCOUNTER — Ambulatory Visit (INDEPENDENT_AMBULATORY_CARE_PROVIDER_SITE_OTHER): Payer: Commercial Managed Care - HMO | Admitting: Internal Medicine

## 2016-03-22 VITALS — BP 118/80 | HR 56 | Temp 97.5°F | Resp 16 | Ht 67.0 in | Wt 171.2 lb

## 2016-03-22 DIAGNOSIS — E559 Vitamin D deficiency, unspecified: Secondary | ICD-10-CM

## 2016-03-22 DIAGNOSIS — M1000 Idiopathic gout, unspecified site: Secondary | ICD-10-CM | POA: Diagnosis not present

## 2016-03-22 DIAGNOSIS — Z79899 Other long term (current) drug therapy: Secondary | ICD-10-CM | POA: Diagnosis not present

## 2016-03-22 DIAGNOSIS — R7303 Prediabetes: Secondary | ICD-10-CM

## 2016-03-22 DIAGNOSIS — G301 Alzheimer's disease with late onset: Secondary | ICD-10-CM | POA: Diagnosis not present

## 2016-03-22 DIAGNOSIS — F0280 Dementia in other diseases classified elsewhere without behavioral disturbance: Secondary | ICD-10-CM | POA: Diagnosis not present

## 2016-03-22 DIAGNOSIS — I10 Essential (primary) hypertension: Secondary | ICD-10-CM

## 2016-03-22 DIAGNOSIS — E782 Mixed hyperlipidemia: Secondary | ICD-10-CM

## 2016-03-22 LAB — HEPATIC FUNCTION PANEL
ALT: 14 U/L (ref 9–46)
AST: 20 U/L (ref 10–35)
Albumin: 4.3 g/dL (ref 3.6–5.1)
Alkaline Phosphatase: 73 U/L (ref 40–115)
Bilirubin, Direct: 0.1 mg/dL (ref <=0.2)
Indirect Bilirubin: 0.4 mg/dL (ref 0.2–1.2)
Total Bilirubin: 0.5 mg/dL (ref 0.2–1.2)
Total Protein: 7 g/dL (ref 6.1–8.1)

## 2016-03-22 LAB — CBC WITH DIFFERENTIAL/PLATELET
BASOS ABS: 0 {cells}/uL (ref 0–200)
Basophils Relative: 0 %
EOS ABS: 574 {cells}/uL — AB (ref 15–500)
EOS PCT: 7 %
HCT: 40.7 % (ref 38.5–50.0)
Hemoglobin: 13.6 g/dL (ref 13.2–17.1)
LYMPHS PCT: 21 %
Lymphs Abs: 1722 cells/uL (ref 850–3900)
MCH: 30.8 pg (ref 27.0–33.0)
MCHC: 33.4 g/dL (ref 32.0–36.0)
MCV: 92.3 fL (ref 80.0–100.0)
MONOS PCT: 9 %
MPV: 10.2 fL (ref 7.5–12.5)
Monocytes Absolute: 738 cells/uL (ref 200–950)
NEUTROS ABS: 5166 {cells}/uL (ref 1500–7800)
NEUTROS PCT: 63 %
PLATELETS: 160 10*3/uL (ref 140–400)
RBC: 4.41 MIL/uL (ref 4.20–5.80)
RDW: 15.1 % — ABNORMAL HIGH (ref 11.0–15.0)
WBC: 8.2 10*3/uL (ref 3.8–10.8)

## 2016-03-22 LAB — LIPID PANEL
Cholesterol: 132 mg/dL (ref <200)
HDL: 45 mg/dL (ref >40)
LDL Cholesterol: 63 mg/dL
Total CHOL/HDL Ratio: 2.9 Ratio (ref <5.0)
Triglycerides: 121 mg/dL (ref <150)
VLDL: 24 mg/dL (ref <30)

## 2016-03-22 LAB — BASIC METABOLIC PANEL WITH GFR
BUN: 25 mg/dL (ref 7–25)
CALCIUM: 9.7 mg/dL (ref 8.6–10.3)
CHLORIDE: 106 mmol/L (ref 98–110)
CO2: 24 mmol/L (ref 20–31)
CREATININE: 1.3 mg/dL — AB (ref 0.70–1.25)
GFR, Est African American: 65 mL/min (ref >=60)
GFR, Est Non African American: 56 mL/min — ABNORMAL LOW (ref >=60)
Glucose, Bld: 87 mg/dL (ref 65–99)
Potassium: 4.7 mmol/L (ref 3.5–5.3)
Sodium: 141 mmol/L (ref 135–146)

## 2016-03-22 LAB — TSH: TSH: 1.15 mIU/L (ref 0.40–4.50)

## 2016-03-22 LAB — HEMOGLOBIN A1C
Hgb A1c MFr Bld: 5.2 % (ref <5.7)
Mean Plasma Glucose: 103 mg/dL

## 2016-03-22 LAB — URIC ACID: Uric Acid, Serum: 5.7 mg/dL (ref 4.0–8.0)

## 2016-03-22 LAB — MAGNESIUM: Magnesium: 1.9 mg/dL (ref 1.5–2.5)

## 2016-03-22 NOTE — Patient Instructions (Signed)
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
## 2016-03-23 LAB — INSULIN, RANDOM: Insulin: 6.1 u[IU]/mL (ref 2.0–19.6)

## 2016-03-23 LAB — VITAMIN D 25 HYDROXY (VIT D DEFICIENCY, FRACTURES): Vit D, 25-Hydroxy: 44 ng/mL (ref 30–100)

## 2016-03-25 ENCOUNTER — Encounter: Payer: Self-pay | Admitting: Internal Medicine

## 2016-04-05 ENCOUNTER — Other Ambulatory Visit: Payer: Self-pay | Admitting: *Deleted

## 2016-04-05 DIAGNOSIS — F0390 Unspecified dementia without behavioral disturbance: Secondary | ICD-10-CM

## 2016-04-05 DIAGNOSIS — I10 Essential (primary) hypertension: Secondary | ICD-10-CM

## 2016-04-05 DIAGNOSIS — F03A0 Unspecified dementia, mild, without behavioral disturbance, psychotic disturbance, mood disturbance, and anxiety: Secondary | ICD-10-CM

## 2016-04-05 MED ORDER — AMLODIPINE BESYLATE 10 MG PO TABS: 10.0000 mg | ORAL_TABLET | Freq: Every day | ORAL | 1 refills | Status: DC

## 2016-04-05 MED ORDER — CARVEDILOL 25 MG PO TABS: 25.0000 mg | ORAL_TABLET | Freq: Two times a day (BID) | ORAL | 1 refills | Status: DC

## 2016-04-05 MED ORDER — PRAVASTATIN SODIUM 40 MG PO TABS: 40.0000 mg | ORAL_TABLET | Freq: Every day | ORAL | 1 refills | Status: DC

## 2016-04-05 MED ORDER — DONEPEZIL HCL 10 MG PO TABS: ORAL_TABLET | ORAL | 1 refills | Status: DC

## 2016-04-05 MED ORDER — SERTRALINE HCL 100 MG PO TABS: 100.0000 mg | ORAL_TABLET | Freq: Every day | ORAL | 1 refills | Status: DC

## 2016-04-05 MED ORDER — RANITIDINE HCL 300 MG PO TABS: 300.0000 mg | ORAL_TABLET | Freq: Every day | ORAL | 1 refills | Status: DC | PRN

## 2016-04-05 MED ORDER — ALPRAZOLAM 1 MG PO TABS: ORAL_TABLET | ORAL | 0 refills | Status: DC

## 2016-04-05 MED ORDER — TAMSULOSIN HCL 0.4 MG PO CAPS
0.4000 mg | ORAL_CAPSULE | Freq: Every day | ORAL | 1 refills | Status: DC
Start: 2016-04-05 — End: 2017-01-02

## 2016-04-05 MED ORDER — LISINOPRIL 30 MG PO TABS: 30.0000 mg | ORAL_TABLET | Freq: Every day | ORAL | 1 refills | Status: DC

## 2016-05-02 ENCOUNTER — Other Ambulatory Visit: Payer: Self-pay | Admitting: *Deleted

## 2016-05-02 DIAGNOSIS — I10 Essential (primary) hypertension: Secondary | ICD-10-CM

## 2016-05-02 MED ORDER — RANITIDINE HCL 300 MG PO TABS: 300.0000 mg | ORAL_TABLET | Freq: Every day | ORAL | 1 refills | Status: DC | PRN

## 2016-05-02 MED ORDER — PRAVASTATIN SODIUM 40 MG PO TABS: 40.0000 mg | ORAL_TABLET | Freq: Every day | ORAL | 1 refills | Status: DC

## 2016-05-02 MED ORDER — LISINOPRIL 30 MG PO TABS: 30.0000 mg | ORAL_TABLET | Freq: Every day | ORAL | 1 refills | Status: DC

## 2016-07-11 ENCOUNTER — Ambulatory Visit: Payer: Self-pay | Admitting: Physician Assistant

## 2016-07-11 ENCOUNTER — Other Ambulatory Visit: Payer: Self-pay | Admitting: Cardiothoracic Surgery

## 2016-07-11 DIAGNOSIS — I712 Thoracic aortic aneurysm, without rupture, unspecified: Secondary | ICD-10-CM

## 2016-07-12 ENCOUNTER — Encounter: Payer: Self-pay | Admitting: Physician Assistant

## 2016-07-12 ENCOUNTER — Ambulatory Visit (INDEPENDENT_AMBULATORY_CARE_PROVIDER_SITE_OTHER): Payer: Medicare HMO | Admitting: Physician Assistant

## 2016-07-12 VITALS — BP 122/64 | HR 66 | Temp 97.3°F | Resp 14 | Ht 67.0 in | Wt 175.0 lb

## 2016-07-12 DIAGNOSIS — R7303 Prediabetes: Secondary | ICD-10-CM | POA: Diagnosis not present

## 2016-07-12 DIAGNOSIS — I2699 Other pulmonary embolism without acute cor pulmonale: Secondary | ICD-10-CM | POA: Diagnosis not present

## 2016-07-12 DIAGNOSIS — E559 Vitamin D deficiency, unspecified: Secondary | ICD-10-CM | POA: Diagnosis not present

## 2016-07-12 DIAGNOSIS — R350 Frequency of micturition: Secondary | ICD-10-CM

## 2016-07-12 DIAGNOSIS — Z91199 Patient's noncompliance with other medical treatment and regimen due to unspecified reason: Secondary | ICD-10-CM

## 2016-07-12 DIAGNOSIS — I10 Essential (primary) hypertension: Secondary | ICD-10-CM | POA: Diagnosis not present

## 2016-07-12 DIAGNOSIS — E782 Mixed hyperlipidemia: Secondary | ICD-10-CM

## 2016-07-12 DIAGNOSIS — D6859 Other primary thrombophilia: Secondary | ICD-10-CM

## 2016-07-12 DIAGNOSIS — Z0000 Encounter for general adult medical examination without abnormal findings: Secondary | ICD-10-CM

## 2016-07-12 DIAGNOSIS — Z79899 Other long term (current) drug therapy: Secondary | ICD-10-CM | POA: Diagnosis not present

## 2016-07-12 DIAGNOSIS — I5022 Chronic systolic (congestive) heart failure: Secondary | ICD-10-CM

## 2016-07-12 DIAGNOSIS — F411 Generalized anxiety disorder: Secondary | ICD-10-CM

## 2016-07-12 DIAGNOSIS — Z0001 Encounter for general adult medical examination with abnormal findings: Secondary | ICD-10-CM | POA: Diagnosis not present

## 2016-07-12 DIAGNOSIS — F0390 Unspecified dementia without behavioral disturbance: Secondary | ICD-10-CM | POA: Diagnosis not present

## 2016-07-12 DIAGNOSIS — I110 Hypertensive heart disease with heart failure: Secondary | ICD-10-CM | POA: Diagnosis not present

## 2016-07-12 DIAGNOSIS — M1000 Idiopathic gout, unspecified site: Secondary | ICD-10-CM

## 2016-07-12 DIAGNOSIS — N401 Enlarged prostate with lower urinary tract symptoms: Secondary | ICD-10-CM

## 2016-07-12 DIAGNOSIS — Z1810 Retained metal fragments, unspecified: Secondary | ICD-10-CM

## 2016-07-12 DIAGNOSIS — I824Y9 Acute embolism and thrombosis of unspecified deep veins of unspecified proximal lower extremity: Secondary | ICD-10-CM

## 2016-07-12 DIAGNOSIS — Z9119 Patient's noncompliance with other medical treatment and regimen: Secondary | ICD-10-CM

## 2016-07-12 DIAGNOSIS — Z8673 Personal history of transient ischemic attack (TIA), and cerebral infarction without residual deficits: Secondary | ICD-10-CM

## 2016-07-12 DIAGNOSIS — I272 Pulmonary hypertension, unspecified: Secondary | ICD-10-CM | POA: Diagnosis not present

## 2016-07-12 DIAGNOSIS — F03A0 Unspecified dementia, mild, without behavioral disturbance, psychotic disturbance, mood disturbance, and anxiety: Secondary | ICD-10-CM

## 2016-07-12 DIAGNOSIS — R6889 Other general symptoms and signs: Secondary | ICD-10-CM

## 2016-07-12 LAB — CBC WITH DIFFERENTIAL/PLATELET
Basophils Absolute: 0 cells/uL (ref 0–200)
Basophils Relative: 0 %
EOS PCT: 3 %
Eosinophils Absolute: 297 cells/uL (ref 15–500)
HCT: 41.3 % (ref 38.5–50.0)
HEMOGLOBIN: 13.6 g/dL (ref 13.2–17.1)
LYMPHS ABS: 1782 {cells}/uL (ref 850–3900)
Lymphocytes Relative: 18 %
MCH: 30.2 pg (ref 27.0–33.0)
MCHC: 32.9 g/dL (ref 32.0–36.0)
MCV: 91.8 fL (ref 80.0–100.0)
MPV: 9.7 fL (ref 7.5–12.5)
Monocytes Absolute: 990 cells/uL — ABNORMAL HIGH (ref 200–950)
Monocytes Relative: 10 %
NEUTROS PCT: 69 %
Neutro Abs: 6831 cells/uL (ref 1500–7800)
PLATELETS: 169 10*3/uL (ref 140–400)
RBC: 4.5 MIL/uL (ref 4.20–5.80)
RDW: 14.7 % (ref 11.0–15.0)
WBC: 9.9 10*3/uL (ref 3.8–10.8)

## 2016-07-12 LAB — HEMOGLOBIN A1C
Hgb A1c MFr Bld: 5.5 % (ref <5.7)
Mean Plasma Glucose: 111 mg/dL

## 2016-07-12 LAB — TSH: TSH: 1.66 m[IU]/L (ref 0.40–4.50)

## 2016-07-12 MED ORDER — TRAMADOL HCL 50 MG PO TABS: ORAL_TABLET | ORAL | 0 refills | Status: DC

## 2016-07-12 NOTE — Patient Instructions (Signed)
Try the tramadol at night for leg pain/sleep If this does not help call the office and we will try a different medication Follow up ortho for possible infection in your knee for arthritis  Can try melatonin 5mg -15 mg at night for sleep, can also do benadryl 25-50mg  at night for sleep.  If this does not help we can try prescription medication.  Also here is some information about good sleep hygiene.   Insomnia Insomnia is frequent trouble falling and/or staying asleep. Insomnia can be a long term problem or a short term problem. Both are common. Insomnia can be a short term problem when the wakefulness is related to a certain stress or worry. Long term insomnia is often related to ongoing stress during waking hours and/or poor sleeping habits. Overtime, sleep deprivation itself can make the problem worse. Every little thing feels more severe because you are overtired and your ability to cope is decreased. CAUSES   Stress, anxiety, and depression.  Poor sleeping habits.  Distractions such as TV in the bedroom.  Naps close to bedtime.  Engaging in emotionally charged conversations before bed.  Technical reading before sleep.  Alcohol and other sedatives. They may make the problem worse. They can hurt normal sleep patterns and normal dream activity.  Stimulants such as caffeine for several hours prior to bedtime.  Pain syndromes and shortness of breath can cause insomnia.  Exercise late at night.  Changing time zones may cause sleeping problems (jet lag). It is sometimes helpful to have someone observe your sleeping patterns. They should look for periods of not breathing during the night (sleep apnea). They should also look to see how long those periods last. If you live alone or observers are uncertain, you can also be observed at a sleep clinic where your sleep patterns will be professionally monitored. Sleep apnea requires a checkup and treatment. Give your caregivers your medical  history. Give your caregivers observations your family has made about your sleep.  SYMPTOMS   Not feeling rested in the morning.  Anxiety and restlessness at bedtime.  Difficulty falling and staying asleep. TREATMENT   Your caregiver may prescribe treatment for an underlying medical disorders. Your caregiver can give advice or help if you are using alcohol or other drugs for self-medication. Treatment of underlying problems will usually eliminate insomnia problems.  Medications can be prescribed for short time use. They are generally not recommended for lengthy use.  Over-the-counter sleep medicines are not recommended for lengthy use. They can be habit forming.  You can promote easier sleeping by making lifestyle changes such as:  Using relaxation techniques that help with breathing and reduce muscle tension.  Exercising earlier in the day.  Changing your diet and the time of your last meal. No night time snacks.  Establish a regular time to go to bed.  Counseling can help with stressful problems and worry.  Soothing music and white noise may be helpful if there are background noises you cannot remove.  Stop tedious detailed work at least one hour before bedtime. HOME CARE INSTRUCTIONS   Keep a diary. Inform your caregiver about your progress. This includes any medication side effects. See your caregiver regularly. Take note of:  Times when you are asleep.  Times when you are awake during the night.  The quality of your sleep.  How you feel the next day. This information will help your caregiver care for you.  Get out of bed if you are still awake after 15 minutes.  Read or do some quiet activity. Keep the lights down. Wait until you feel sleepy and go back to bed.  Keep regular sleeping and waking hours. Avoid naps.  Exercise regularly.  Avoid distractions at bedtime. Distractions include watching television or engaging in any intense or detailed activity like  attempting to balance the household checkbook.  Develop a bedtime ritual. Keep a familiar routine of bathing, brushing your teeth, climbing into bed at the same time each night, listening to soothing music. Routines increase the success of falling to sleep faster.  Use relaxation techniques. This can be using breathing and muscle tension release routines. It can also include visualizing peaceful scenes. You can also help control troubling or intruding thoughts by keeping your mind occupied with boring or repetitive thoughts like the old concept of counting sheep. You can make it more creative like imagining planting one beautiful flower after another in your backyard garden.  During your day, work to eliminate stress. When this is not possible use some of the previous suggestions to help reduce the anxiety that accompanies stressful situations. MAKE SURE YOU:   Understand these instructions.  Will watch your condition.  Will get help right away if you are not doing well or get worse. Document Released: 04/28/2000 Document Revised: 07/24/2011 Document Reviewed: 05/29/2007 [facility] Patient Information 2015 [place], [place]. This information is not intended to replace advice given to you by your health care provider. Make sure you discuss any questions you have with your health care provider.
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
## 2016-07-13 LAB — BASIC METABOLIC PANEL WITH GFR
BUN: 21 mg/dL (ref 7–25)
CALCIUM: 9.8 mg/dL (ref 8.6–10.3)
CO2: 25 mmol/L (ref 20–31)
Chloride: 106 mmol/L (ref 98–110)
Creat: 1.37 mg/dL — ABNORMAL HIGH (ref 0.70–1.25)
GFR, EST AFRICAN AMERICAN: 61 mL/min (ref >=60)
GFR, EST NON AFRICAN AMERICAN: 53 mL/min — AB (ref >=60)
Glucose, Bld: 97 mg/dL (ref 65–99)
Potassium: 4.3 mmol/L (ref 3.5–5.3)
SODIUM: 141 mmol/L (ref 135–146)

## 2016-07-13 LAB — HEPATIC FUNCTION PANEL
ALT: 14 U/L (ref 9–46)
AST: 18 U/L (ref 10–35)
Albumin: 4 g/dL (ref 3.6–5.1)
Alkaline Phosphatase: 70 U/L (ref 40–115)
BILIRUBIN DIRECT: 0.1 mg/dL (ref <=0.2)
BILIRUBIN TOTAL: 0.6 mg/dL (ref 0.2–1.2)
Indirect Bilirubin: 0.5 mg/dL (ref 0.2–1.2)
Total Protein: 7.3 g/dL (ref 6.1–8.1)

## 2016-07-13 LAB — LIPID PANEL
CHOL/HDL RATIO: 2.6 ratio (ref <5.0)
CHOLESTEROL: 141 mg/dL (ref <200)
HDL: 54 mg/dL (ref >40)
LDL Cholesterol: 71 mg/dL (ref <100)
TRIGLYCERIDES: 81 mg/dL (ref <150)
VLDL: 16 mg/dL (ref <30)

## 2016-07-13 LAB — MAGNESIUM: Magnesium: 1.9 mg/dL (ref 1.5–2.5)

## 2016-07-13 LAB — VITAMIN D 25 HYDROXY (VIT D DEFICIENCY, FRACTURES): Vit D, 25-Hydroxy: 46 ng/mL (ref 30–100)

## 2016-07-13 NOTE — Progress Notes (Signed)
Pt aware of lab results & voiced understanding of those results.

## 2016-07-17 ENCOUNTER — Ambulatory Visit: Payer: Commercial Managed Care - HMO | Admitting: Neurology

## 2016-07-27 ENCOUNTER — Other Ambulatory Visit: Payer: Self-pay | Admitting: Internal Medicine

## 2016-07-27 ENCOUNTER — Other Ambulatory Visit: Payer: Self-pay | Admitting: *Deleted

## 2016-07-27 DIAGNOSIS — I10 Essential (primary) hypertension: Secondary | ICD-10-CM

## 2016-07-27 MED ORDER — CARVEDILOL 25 MG PO TABS: 25.0000 mg | ORAL_TABLET | Freq: Two times a day (BID) | ORAL | 1 refills | Status: DC

## 2016-07-27 MED ORDER — AMLODIPINE BESYLATE 10 MG PO TABS: 10.0000 mg | ORAL_TABLET | Freq: Every day | ORAL | 1 refills | Status: DC

## 2016-07-28 ENCOUNTER — Other Ambulatory Visit: Payer: Self-pay

## 2016-07-28 DIAGNOSIS — F0390 Unspecified dementia without behavioral disturbance: Secondary | ICD-10-CM

## 2016-07-28 DIAGNOSIS — F03A0 Unspecified dementia, mild, without behavioral disturbance, psychotic disturbance, mood disturbance, and anxiety: Secondary | ICD-10-CM

## 2016-07-28 MED ORDER — DONEPEZIL HCL 10 MG PO TABS: ORAL_TABLET | ORAL | 1 refills | Status: DC

## 2016-07-31 ENCOUNTER — Other Ambulatory Visit: Payer: Self-pay | Admitting: *Deleted

## 2016-07-31 MED ORDER — ALPRAZOLAM 1 MG PO TABS: ORAL_TABLET | ORAL | 0 refills | Status: DC

## 2016-08-02 ENCOUNTER — Other Ambulatory Visit: Payer: Self-pay | Admitting: Physician Assistant

## 2016-08-02 MED ORDER — TRAMADOL HCL 50 MG PO TABS
ORAL_TABLET | ORAL | 1 refills | Status: DC
Start: 2016-08-02 — End: 2017-07-02

## 2016-08-02 MED ORDER — TRAMADOL HCL 50 MG PO TABS: ORAL_TABLET | ORAL | 1 refills | Status: DC

## 2016-08-16 ENCOUNTER — Ambulatory Visit: Payer: Medicare HMO | Admitting: Cardiothoracic Surgery

## 2016-08-16 ENCOUNTER — Other Ambulatory Visit: Payer: Commercial Managed Care - HMO

## 2016-08-16 ENCOUNTER — Other Ambulatory Visit: Payer: Self-pay | Admitting: Physician Assistant

## 2016-08-16 MED ORDER — ALPRAZOLAM 1 MG PO TABS: ORAL_TABLET | ORAL | 0 refills | Status: DC

## 2016-08-21 ENCOUNTER — Encounter: Payer: Self-pay | Admitting: Internal Medicine

## 2016-10-30 ENCOUNTER — Encounter: Payer: Self-pay | Admitting: Internal Medicine

## 2016-11-06 ENCOUNTER — Other Ambulatory Visit: Payer: Self-pay | Admitting: Internal Medicine

## 2016-11-06 DIAGNOSIS — I10 Essential (primary) hypertension: Secondary | ICD-10-CM

## 2016-11-20 ENCOUNTER — Other Ambulatory Visit: Payer: Self-pay | Admitting: Internal Medicine

## 2016-11-24 ENCOUNTER — Other Ambulatory Visit: Payer: Self-pay | Admitting: Internal Medicine

## 2016-12-05 ENCOUNTER — Other Ambulatory Visit: Payer: Self-pay | Admitting: Cardiothoracic Surgery

## 2016-12-11 ENCOUNTER — Other Ambulatory Visit: Payer: Self-pay | Admitting: Cardiothoracic Surgery

## 2016-12-11 ENCOUNTER — Other Ambulatory Visit: Payer: Self-pay | Admitting: Internal Medicine

## 2016-12-12 ENCOUNTER — Other Ambulatory Visit: Payer: Self-pay | Admitting: *Deleted

## 2016-12-12 MED ORDER — RIVAROXABAN 20 MG PO TABS: ORAL_TABLET | ORAL | 0 refills | Status: DC

## 2016-12-13 ENCOUNTER — Other Ambulatory Visit: Payer: Self-pay | Admitting: *Deleted

## 2016-12-13 ENCOUNTER — Ambulatory Visit (INDEPENDENT_AMBULATORY_CARE_PROVIDER_SITE_OTHER): Payer: Medicare HMO | Admitting: Internal Medicine

## 2016-12-13 VITALS — BP 116/78 | HR 60 | Temp 97.3°F | Resp 18 | Ht 66.5 in | Wt 173.8 lb

## 2016-12-13 DIAGNOSIS — R7303 Prediabetes: Secondary | ICD-10-CM

## 2016-12-13 DIAGNOSIS — I2510 Atherosclerotic heart disease of native coronary artery without angina pectoris: Secondary | ICD-10-CM | POA: Diagnosis not present

## 2016-12-13 DIAGNOSIS — Z136 Encounter for screening for cardiovascular disorders: Secondary | ICD-10-CM

## 2016-12-13 DIAGNOSIS — Z0001 Encounter for general adult medical examination with abnormal findings: Secondary | ICD-10-CM

## 2016-12-13 DIAGNOSIS — E782 Mixed hyperlipidemia: Secondary | ICD-10-CM

## 2016-12-13 DIAGNOSIS — I10 Essential (primary) hypertension: Secondary | ICD-10-CM | POA: Diagnosis not present

## 2016-12-13 DIAGNOSIS — E559 Vitamin D deficiency, unspecified: Secondary | ICD-10-CM

## 2016-12-13 DIAGNOSIS — Z125 Encounter for screening for malignant neoplasm of prostate: Secondary | ICD-10-CM

## 2016-12-13 DIAGNOSIS — M1000 Idiopathic gout, unspecified site: Secondary | ICD-10-CM

## 2016-12-13 DIAGNOSIS — F0280 Dementia in other diseases classified elsewhere without behavioral disturbance: Secondary | ICD-10-CM

## 2016-12-13 DIAGNOSIS — Z1212 Encounter for screening for malignant neoplasm of rectum: Secondary | ICD-10-CM

## 2016-12-13 DIAGNOSIS — Z0000 Encounter for general adult medical examination without abnormal findings: Secondary | ICD-10-CM

## 2016-12-13 DIAGNOSIS — G301 Alzheimer's disease with late onset: Secondary | ICD-10-CM

## 2016-12-13 LAB — CBC WITH DIFFERENTIAL/PLATELET
BASOS ABS: 0 {cells}/uL (ref 0–200)
BASOS PCT: 0 %
EOS PCT: 4 %
Eosinophils Absolute: 380 cells/uL (ref 15–500)
HCT: 42.2 % (ref 38.5–50.0)
HEMOGLOBIN: 13.9 g/dL (ref 13.2–17.1)
LYMPHS ABS: 2090 {cells}/uL (ref 850–3900)
Lymphocytes Relative: 22 %
MCH: 30.6 pg (ref 27.0–33.0)
MCHC: 32.9 g/dL (ref 32.0–36.0)
MCV: 93 fL (ref 80.0–100.0)
MPV: 10.2 fL (ref 7.5–12.5)
Monocytes Absolute: 1045 cells/uL — ABNORMAL HIGH (ref 200–950)
Monocytes Relative: 11 %
NEUTROS ABS: 5985 {cells}/uL (ref 1500–7800)
Neutrophils Relative %: 63 %
Platelets: 172 10*3/uL (ref 140–400)
RBC: 4.54 MIL/uL (ref 4.20–5.80)
RDW: 14.7 % (ref 11.0–15.0)
WBC: 9.5 10*3/uL (ref 3.8–10.8)

## 2016-12-13 LAB — TSH: TSH: 1.15 mIU/L (ref 0.40–4.50)

## 2016-12-13 MED ORDER — FUROSEMIDE 40 MG PO TABS: 40.0000 mg | ORAL_TABLET | Freq: Every day | ORAL | 1 refills | Status: DC | PRN

## 2016-12-13 MED ORDER — RIVAROXABAN 20 MG PO TABS: ORAL_TABLET | ORAL | 0 refills | Status: DC
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
## 2016-12-13 NOTE — Patient Instructions (Signed)

## 2016-12-14 LAB — BASIC METABOLIC PANEL WITH GFR
BUN: 27 mg/dL — AB (ref 7–25)
CALCIUM: 9.2 mg/dL (ref 8.6–10.3)
CHLORIDE: 106 mmol/L (ref 98–110)
CO2: 23 mmol/L (ref 20–31)
CREATININE: 1.48 mg/dL — AB (ref 0.70–1.25)
GFR, EST AFRICAN AMERICAN: 55 mL/min — AB (ref >=60)
GFR, EST NON AFRICAN AMERICAN: 48 mL/min — AB (ref >=60)
Glucose, Bld: 87 mg/dL (ref 65–99)
POTASSIUM: 4.4 mmol/L (ref 3.5–5.3)
SODIUM: 142 mmol/L (ref 135–146)

## 2016-12-14 LAB — URINALYSIS, ROUTINE W REFLEX MICROSCOPIC
Bilirubin Urine: NEGATIVE
Glucose, UA: NEGATIVE
HGB URINE DIPSTICK: NEGATIVE
KETONES UR: NEGATIVE
LEUKOCYTES UA: NEGATIVE
NITRITE: NEGATIVE
PH: 5.5 (ref 5.0–8.0)
PROTEIN: NEGATIVE
SPECIFIC GRAVITY, URINE: 1.018 (ref 1.001–1.035)

## 2016-12-14 LAB — HEMOGLOBIN A1C
Hgb A1c MFr Bld: 5.6 % (ref <5.7)
MEAN PLASMA GLUCOSE: 114 mg/dL

## 2016-12-14 LAB — LIPID PANEL
CHOLESTEROL: 136 mg/dL (ref <200)
HDL: 45 mg/dL (ref >40)
LDL CALC: 53 mg/dL (ref <100)
TRIGLYCERIDES: 188 mg/dL — AB (ref <150)
Total CHOL/HDL Ratio: 3 Ratio (ref <5.0)
VLDL: 38 mg/dL — ABNORMAL HIGH (ref <30)

## 2016-12-14 LAB — HEPATIC FUNCTION PANEL
ALBUMIN: 3.9 g/dL (ref 3.6–5.1)
ALK PHOS: 73 U/L (ref 40–115)
ALT: 20 U/L (ref 9–46)
AST: 22 U/L (ref 10–35)
BILIRUBIN TOTAL: 0.6 mg/dL (ref 0.2–1.2)
Bilirubin, Direct: 0.1 mg/dL (ref <=0.2)
Indirect Bilirubin: 0.5 mg/dL (ref 0.2–1.2)
TOTAL PROTEIN: 6.6 g/dL (ref 6.1–8.1)

## 2016-12-14 LAB — PSA: PSA: 0.8 ng/mL (ref <=4.0)

## 2016-12-14 LAB — VITAMIN D 25 HYDROXY (VIT D DEFICIENCY, FRACTURES): VIT D 25 HYDROXY: 44 ng/mL (ref 30–100)

## 2016-12-14 LAB — MAGNESIUM: MAGNESIUM: 2.1 mg/dL (ref 1.5–2.5)

## 2016-12-14 LAB — INSULIN, RANDOM: INSULIN: 12.4 u[IU]/mL (ref 2.0–19.6)

## 2016-12-14 LAB — MICROALBUMIN / CREATININE URINE RATIO
Creatinine, Urine: 198 mg/dL (ref 20–370)
Microalb, Ur: <0.2 mg/dL

## 2016-12-17 ENCOUNTER — Encounter: Payer: Self-pay | Admitting: Internal Medicine

## 2017-01-01 ENCOUNTER — Other Ambulatory Visit: Payer: Self-pay | Admitting: *Deleted

## 2017-01-01 DIAGNOSIS — I10 Essential (primary) hypertension: Secondary | ICD-10-CM

## 2017-01-01 MED ORDER — CARVEDILOL 25 MG PO TABS
25.0000 mg | ORAL_TABLET | Freq: Two times a day (BID) | ORAL | 1 refills | Status: DC
Start: 2017-01-01 — End: 2017-01-03

## 2017-01-01 MED ORDER — ALPRAZOLAM 1 MG PO TABS: ORAL_TABLET | ORAL | 0 refills | Status: DC

## 2017-01-01 MED ORDER — RANITIDINE HCL 300 MG PO TABS: 300.0000 mg | ORAL_TABLET | Freq: Every day | ORAL | 1 refills | Status: DC | PRN

## 2017-01-01 MED ORDER — RIVAROXABAN 20 MG PO TABS: ORAL_TABLET | ORAL | 1 refills | Status: DC

## 2017-01-01 MED ORDER — LISINOPRIL 30 MG PO TABS: 30.0000 mg | ORAL_TABLET | Freq: Every day | ORAL | 1 refills | Status: DC

## 2017-01-02 ENCOUNTER — Other Ambulatory Visit: Payer: Self-pay | Admitting: *Deleted

## 2017-01-02 DIAGNOSIS — F03A0 Unspecified dementia, mild, without behavioral disturbance, psychotic disturbance, mood disturbance, and anxiety: Secondary | ICD-10-CM

## 2017-01-02 DIAGNOSIS — I10 Essential (primary) hypertension: Secondary | ICD-10-CM

## 2017-01-02 DIAGNOSIS — F0390 Unspecified dementia without behavioral disturbance: Secondary | ICD-10-CM

## 2017-01-02 MED ORDER — TAMSULOSIN HCL 0.4 MG PO CAPS: 0.4000 mg | ORAL_CAPSULE | Freq: Every day | ORAL | 1 refills | Status: DC

## 2017-01-02 MED ORDER — SERTRALINE HCL 100 MG PO TABS: 100.0000 mg | ORAL_TABLET | Freq: Every day | ORAL | 1 refills | Status: DC

## 2017-01-02 MED ORDER — FUROSEMIDE 40 MG PO TABS: 40.0000 mg | ORAL_TABLET | Freq: Every day | ORAL | 1 refills | Status: DC | PRN

## 2017-01-02 MED ORDER — DONEPEZIL HCL 10 MG PO TABS: ORAL_TABLET | ORAL | 1 refills | Status: DC

## 2017-01-02 MED ORDER — PRAVASTATIN SODIUM 40 MG PO TABS: 40.0000 mg | ORAL_TABLET | Freq: Every day | ORAL | 1 refills | Status: DC

## 2017-01-02 MED ORDER — AMLODIPINE BESYLATE 10 MG PO TABS: 10.0000 mg | ORAL_TABLET | Freq: Every day | ORAL | 1 refills | Status: DC

## 2017-01-03 ENCOUNTER — Other Ambulatory Visit: Payer: Self-pay | Admitting: *Deleted

## 2017-01-03 DIAGNOSIS — I10 Essential (primary) hypertension: Secondary | ICD-10-CM

## 2017-01-03 MED ORDER — CARVEDILOL 25 MG PO TABS: 25.0000 mg | ORAL_TABLET | Freq: Two times a day (BID) | ORAL | 1 refills | Status: DC

## 2017-01-03 MED ORDER — LISINOPRIL 30 MG PO TABS: 30.0000 mg | ORAL_TABLET | Freq: Every day | ORAL | 1 refills | Status: DC

## 2017-01-03 MED ORDER — RANITIDINE HCL 300 MG PO TABS: 300.0000 mg | ORAL_TABLET | Freq: Every day | ORAL | 1 refills | Status: DC | PRN

## 2017-01-03 MED ORDER — RIVAROXABAN 20 MG PO TABS: ORAL_TABLET | ORAL | 1 refills | Status: DC

## 2017-01-08 ENCOUNTER — Other Ambulatory Visit: Payer: Self-pay

## 2017-01-09 ENCOUNTER — Other Ambulatory Visit: Payer: Self-pay | Admitting: Internal Medicine

## 2017-01-09 DIAGNOSIS — I10 Essential (primary) hypertension: Secondary | ICD-10-CM

## 2017-01-09 DIAGNOSIS — K219 Gastro-esophageal reflux disease without esophagitis: Secondary | ICD-10-CM

## 2017-01-09 DIAGNOSIS — H25813 Combined forms of age-related cataract, bilateral: Secondary | ICD-10-CM | POA: Diagnosis not present

## 2017-01-09 DIAGNOSIS — H40023 Open angle with borderline findings, high risk, bilateral: Secondary | ICD-10-CM | POA: Diagnosis not present

## 2017-01-09 DIAGNOSIS — H43813 Vitreous degeneration, bilateral: Secondary | ICD-10-CM | POA: Diagnosis not present

## 2017-01-09 MED ORDER — RANITIDINE HCL 300 MG PO TABS
300.0000 mg | ORAL_TABLET | Freq: Every day | ORAL | 1 refills | Status: DC | PRN
Start: 2017-01-09 — End: 2017-07-02

## 2017-01-09 MED ORDER — CARVEDILOL 25 MG PO TABS: 25.0000 mg | ORAL_TABLET | Freq: Two times a day (BID) | ORAL | 1 refills | Status: DC

## 2017-01-09 MED ORDER — LISINOPRIL 30 MG PO TABS: 30.0000 mg | ORAL_TABLET | Freq: Every day | ORAL | 1 refills | Status: DC

## 2017-01-10 NOTE — Telephone Encounter (Signed)
Waiting on return call in order to ascertain what meds need refilling.

## 2017-01-18 ENCOUNTER — Other Ambulatory Visit: Payer: Self-pay | Admitting: *Deleted

## 2017-01-18 MED ORDER — RIVAROXABAN 20 MG PO TABS: ORAL_TABLET | ORAL | 1 refills | Status: DC

## 2017-02-06 DIAGNOSIS — H40023 Open angle with borderline findings, high risk, bilateral: Secondary | ICD-10-CM | POA: Diagnosis not present

## 2017-03-26 ENCOUNTER — Other Ambulatory Visit: Payer: Self-pay | Admitting: Internal Medicine

## 2017-04-03 ENCOUNTER — Encounter: Payer: Self-pay | Admitting: Physician Assistant

## 2017-04-03 DIAGNOSIS — Z86711 Personal history of pulmonary embolism: Secondary | ICD-10-CM | POA: Insufficient documentation

## 2017-04-03 NOTE — Progress Notes (Signed)
Assessment and Plan:   Hypertensive heart disease with congestive heart failure, unspecified heart failure type (HCC) - continue medications, DASH diet, exercise and monitor at home. Call if greater than 130/80.  -     CBC with Differential/Platelet -     BASIC METABOLIC PANEL WITH GFR -     Hepatic function panel -     TSH  Chronic systolic heart failure (HCC) .weight stable, continue to monitor weight  Pulmonary hypertension (HCC) Continue to monitor  Mixed hyperlipidemia -continue medications, check lipids, decrease fatty foods, increase activity.  -     Lipid panel  Primary hypercoagulable state (HCC) Monitor, contiue meds  History of pulmonary embolus (PE) Continue meds  B12 deficiency -     Vitamin B12  Imbalance -     Ambulatory referral to Home Health  Dementia LONG discussion with brother about dementia, no cure, will try to go to [facility] to get information, information provided to the brother.  -     sertraline (ZOLOFT) 50 MG tablet; Take 1 tablet (50 mg total) by mouth daily. -     buPROPion (WELLBUTRIN XL) 150 MG 24 hr tablet; Take 1 tablet (150 mg total) by mouth every morning.    Continue diet and meds as discussed. Further disposition pending results of labs. Over 30 minutes of exam, counseling, chart review, and critical decision making was performed Future Appointments  Date Time Provider Department Center  07/05/2017 11:30 AM Lucky Cowboy, MD [facility] None  01/15/2018  3:00 PM Lucky Cowboy, MD [facility] None    HPI 71 y.o. male  presents for 3 month follow up on hypertension, cholesterol, prediabetes, and vitamin D deficiency.  Brother is here, has POA. He is concerned because he does not move much. He has bilateral leg pain, some imbalance that is getting worse. He has severe dementia and trouble leaving the house. Lives with his brother. He has some depression and decreased motivation.   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 Weight is stable.  Wt Readings from Last 3 Encounters:  04/04/17 174 lb (78.9 kg)  12/13/16 173 lb 12.8 oz (78.8 kg)  07/12/16 175 lb (79.4 kg)   Has also had some memory issues, following with Dr. Karel Jarvis. Had normal EEG and CT shows old CVA.    He does not workout. He denies chest pain, shortness of breath, dizziness.  He is on cholesterol medication and denies myalgias. His cholesterol is at goal. The cholesterol last visit was:   Lab Results  Component Value Date   CHOL 136 12/13/2016   HDL 45 12/13/2016   LDLCALC 53 12/13/2016   TRIG 188 (H) 12/13/2016   CHOLHDL 3.0 12/13/2016    He has been working on diet and exercise for prediabetes, and denies paresthesia of the feet, polydipsia, polyuria and visual disturbances. Last A1C in the office was:  Lab Results  Component Value Date   HGBA1C 5.6 12/13/2016   Patient is on Vitamin D supplement.   Lab Results  Component Value Date   VD25OH 44 12/13/2016     BMI is Body mass index is 27.66 kg/m., he is working on diet and exercise. Wt Readings from Last 3 Encounters:  04/04/17 174 lb (78.9 kg)  12/13/16 173 lb 12.8 oz (78.8 kg)  07/12/16 175 lb (79.4 kg)    Current Medications:  Current  Outpatient Medications on File Prior to Visit  Medication Sig Dispense Refill  . ALPRAZolam (XANAX) 1 MG tablet TAKE 1/2 TO 1 TABLET THREE TIMES DAILY  IF  NEEDED FOR ANXIETY. TRY NOT TO TAKE 3 TIMES DAILY, ONLY AS NEEDED. 90 tablet 0  . amLODipine (NORVASC) 10 MG tablet Take 1 tablet (10 mg total) by mouth daily. FOR BLOOD PRESSURE 90 tablet 1  . aspirin EC 81 MG tablet Take 81 mg by mouth daily.    . carvedilol (COREG) 25 MG tablet Take 1 tablet (25 mg total) by mouth 2 (two) times daily. 180 tablet 1  . Cholecalciferol (VITAMIN D) 2000 units tablet Take 2,000 Units by mouth daily.    . donepezil (ARICEPT) 10 MG Marland Kitchentablet Take  1  tablet daily. 90 tablet 1  . furosemide (LASIX) 40 MG tablet Take 1 tablet (40 mg total) by mouth daily as needed for fluid or edema. 90 tablet 1  . lisinopril (PRINIVIL,ZESTRIL) 30 MG tablet Take 1 tablet (30 mg total) by mouth daily. 90 tablet 1  . pravastatin (PRAVACHOL) 40 MG tablet Take 1 tablet (40 mg total) by mouth at bedtime. 90 tablet 1  . ranitidine (ZANTAC) 300 MG tablet Take 1 tablet (300 mg total) by mouth daily as needed for heartburn. 90 tablet 1  . rivaroxaban (XARELTO) 20 MG TABS tablet take 1 tablet by mouth every evening WITH SUPPER 90 tablet 1  . sertraline (ZOLOFT) 100 MG tablet Take 1 tablet (100 mg total) by mouth daily. 90 tablet 1  . tamsulosin (FLOMAX) 0.4 MG CAPS capsule Take 1 capsule (0.4 mg total) by mouth daily. 90 capsule 1  . traMADol (ULTRAM) 50 MG tablet 1 pill at night for pain AS NEEDED 90 tablet 1   No current facility-administered medications on file prior to visit.    Medical History:  Past Medical History:  Diagnosis Date  . CAD (coronary artery disease), native coronary artery    Coronary calcifications noted on CT scan.   . Cardiomyopathy of undetermined type (HCC)   . Chronic systolic heart failure (HCC)    Echo 2015 EF 30-35%   . CVA (cerebral vascular accident) [facility](HCC) June 2013  . Embedded metal fragments    Metallic Pellet In Heart  . Gout   . History of ischemic vertebrobasilar artery cerebellar stroke   . Hyperlipemia   . Hyperlipidemia   . Hypertension   . Hypertensive heart disease   . Illiteracy   . Pre-diabetes   . Pulmonary embolism (HCC) 01/01/2016  . Vitamin D deficiency    Allergies: No Known Allergies   Review of Systems:  Review of Systems  Constitutional: Negative for chills, diaphoresis, fever, malaise/fatigue and weight loss.  HENT: Negative.   Respiratory: Negative for cough, hemoptysis, sputum production, shortness of breath and wheezing.   Cardiovascular: Negative for chest pain, palpitations, orthopnea,  claudication, leg swelling and PND.  Gastrointestinal: Negative.   Genitourinary: Negative.   Musculoskeletal: Negative for back pain, falls, joint pain, myalgias and neck pain.  Skin: Negative.   Neurological: Negative.  Negative for dizziness and weakness.  Psychiatric/Behavioral: Negative for depression, hallucinations, memory loss, substance abuse and suicidal ideas. The patient is not nervous/anxious and does not have insomnia.     Family history- Review and unchanged Social history- Review and unchanged Physical Exam: BP 126/78   Pulse 71   Temp (!) 97.5 F (36.4 C)   Ht 5' 6.5" (1.689 m)   Wt 174 lb (78.9 kg)  SpO2 99%   BMI 27.66 kg/m  Wt Readings from Last 3 Encounters:  04/04/17 174 lb (78.9 kg)  12/13/16 173 lb 12.8 oz (78.8 kg)  07/12/16 175 lb (79.4 kg)   General Appearance: Well nourished, in no apparent distress. Eyes: PERRLA, EOMs, conjunctiva no swelling or erythema Sinuses: No Frontal/maxillary tenderness ENT/Mouth: Ext aud canals clear, TMs without erythema, bulging. No erythema, swelling, or exudate on post pharynx.  Tonsils not swollen or erythematous. Hearing normal.  Neck: Supple, thyroid normal.  Respiratory: Respiratory effort normal, distant heart sounds, BS equal bilaterally without rales, rhonchi, wheezing or stridor.  Cardio: RRR with 3/6 systolic murmur RSB. Brisk peripheral pulses without edema.  Abdomen: Soft, + BS,  Non tender, no guarding, rebound, hernias, masses. Lymphatics: Non tender without lymphadenopathy.  Musculoskeletal: Full ROM, 4/5 strength, slow, antalgic gait Skin: Warm, dry without rashes, lesions, ecchymosis.  Neuro: Cranial nerves intact. Normal muscle tone, no cerebellar symptoms. Psych: Awake and oriented X 3, normal affect, Insight and Judgment appropriate.    Quentin MullingAmanda Alf Doyle, PA-C 11:51 AM [facility]

## 2017-04-04 ENCOUNTER — Encounter: Payer: Self-pay | Admitting: Physician Assistant

## 2017-04-04 ENCOUNTER — Ambulatory Visit: Payer: Medicare HMO | Admitting: Physician Assistant

## 2017-04-04 VITALS — BP 126/78 | HR 71 | Temp 97.5°F | Ht 66.5 in | Wt 174.0 lb

## 2017-04-04 DIAGNOSIS — Z1159 Encounter for screening for other viral diseases: Secondary | ICD-10-CM | POA: Diagnosis not present

## 2017-04-04 DIAGNOSIS — I272 Pulmonary hypertension, unspecified: Secondary | ICD-10-CM | POA: Diagnosis not present

## 2017-04-04 DIAGNOSIS — Z86711 Personal history of pulmonary embolism: Secondary | ICD-10-CM

## 2017-04-04 DIAGNOSIS — E538 Deficiency of other specified B group vitamins: Secondary | ICD-10-CM | POA: Diagnosis not present

## 2017-04-04 DIAGNOSIS — E782 Mixed hyperlipidemia: Secondary | ICD-10-CM | POA: Diagnosis not present

## 2017-04-04 DIAGNOSIS — D6859 Other primary thrombophilia: Secondary | ICD-10-CM

## 2017-04-04 DIAGNOSIS — Z23 Encounter for immunization: Secondary | ICD-10-CM

## 2017-04-04 DIAGNOSIS — I5022 Chronic systolic (congestive) heart failure: Secondary | ICD-10-CM

## 2017-04-04 DIAGNOSIS — I110 Hypertensive heart disease with heart failure: Secondary | ICD-10-CM | POA: Diagnosis not present

## 2017-04-04 DIAGNOSIS — Z79899 Other long term (current) drug therapy: Secondary | ICD-10-CM

## 2017-04-04 DIAGNOSIS — R2689 Other abnormalities of gait and mobility: Secondary | ICD-10-CM | POA: Diagnosis not present

## 2017-04-04 MED ORDER — BUPROPION HCL ER (XL) 150 MG PO TB24: 150.0000 mg | ORAL_TABLET | ORAL | 2 refills | Status: DC

## 2017-04-04 MED ORDER — SERTRALINE HCL 50 MG PO TABS: 50.0000 mg | ORAL_TABLET | Freq: Every day | ORAL | 1 refills | Status: DC

## 2017-04-04 NOTE — Addendum Note (Signed)
Addended by: Dionicio StallUCKER, Alekxander Isola on: 04/04/2017 12:31 PM   Modules accepted: Orders
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
## 2017-04-05 LAB — CBC WITH DIFFERENTIAL/PLATELET
BASOS ABS: 27 {cells}/uL (ref 0–200)
Basophils Relative: 0.3 %
EOS ABS: 216 {cells}/uL (ref 15–500)
Eosinophils Relative: 2.4 %
HCT: 37.4 % — ABNORMAL LOW (ref 38.5–50.0)
Hemoglobin: 12.6 g/dL — ABNORMAL LOW (ref 13.2–17.1)
Lymphs Abs: 1656 cells/uL (ref 850–3900)
MCH: 30.8 pg (ref 27.0–33.0)
MCHC: 33.7 g/dL (ref 32.0–36.0)
MCV: 91.4 fL (ref 80.0–100.0)
MONOS PCT: 10 %
MPV: 10.8 fL (ref 7.5–12.5)
Neutro Abs: 6201 cells/uL (ref 1500–7800)
Neutrophils Relative %: 68.9 %
PLATELETS: 173 10*3/uL (ref 140–400)
RBC: 4.09 10*6/uL — ABNORMAL LOW (ref 4.20–5.80)
RDW: 12.8 % (ref 11.0–15.0)
TOTAL LYMPHOCYTE: 18.4 %
WBC mixed population: 900 cells/uL (ref 200–950)
WBC: 9 10*3/uL (ref 3.8–10.8)

## 2017-04-05 LAB — HEPATIC FUNCTION PANEL
AG RATIO: 1.4 (calc) (ref 1.0–2.5)
ALKALINE PHOSPHATASE (APISO): 73 U/L (ref 40–115)
ALT: 10 U/L (ref 9–46)
AST: 16 U/L (ref 10–35)
Albumin: 4.2 g/dL (ref 3.6–5.1)
BILIRUBIN INDIRECT: 0.4 mg/dL (ref 0.2–1.2)
Bilirubin, Direct: 0.1 mg/dL (ref 0.0–0.2)
Globulin: 3.1 g/dL (calc) (ref 1.9–3.7)
TOTAL PROTEIN: 7.3 g/dL (ref 6.1–8.1)
Total Bilirubin: 0.5 mg/dL (ref 0.2–1.2)

## 2017-04-05 LAB — BASIC METABOLIC PANEL WITH GFR
BUN / CREAT RATIO: 16 (calc) (ref 6–22)
BUN: 23 mg/dL (ref 7–25)
CHLORIDE: 106 mmol/L (ref 98–110)
CO2: 28 mmol/L (ref 20–32)
CREATININE: 1.42 mg/dL — AB (ref 0.70–1.25)
Calcium: 10 mg/dL (ref 8.6–10.3)
GFR, Est African American: 58 mL/min/{1.73_m2} — ABNORMAL LOW (ref >=60)
GFR, Est Non African American: 50 mL/min/{1.73_m2} — ABNORMAL LOW (ref >=60)
GLUCOSE: 82 mg/dL (ref 65–99)
Potassium: 4.6 mmol/L (ref 3.5–5.3)
SODIUM: 141 mmol/L (ref 135–146)

## 2017-04-05 LAB — LIPID PANEL
CHOL/HDL RATIO: 2.6 (calc) (ref <5.0)
CHOLESTEROL: 142 mg/dL (ref <200)
HDL: 54 mg/dL (ref >40)
LDL Cholesterol (Calc): 64 mg/dL (calc)
Non-HDL Cholesterol (Calc): 88 mg/dL (calc) (ref <130)
Triglycerides: 163 mg/dL — ABNORMAL HIGH (ref <150)

## 2017-04-05 LAB — HEPATITIS C ANTIBODY
HEP C AB: NONREACTIVE
SIGNAL TO CUT-OFF: 0.01 (ref <1.00)

## 2017-04-05 LAB — HIV ANTIBODY (ROUTINE TESTING W REFLEX): HIV: NONREACTIVE

## 2017-04-05 LAB — TSH: TSH: 0.8 mIU/L (ref 0.40–4.50)

## 2017-04-05 LAB — VITAMIN B12: VITAMIN B 12: 1163 pg/mL — AB (ref 200–1100)

## 2017-04-09 NOTE — Progress Notes (Signed)
Pt aware of lab results & voiced understanding of those results.

## 2017-04-16 DIAGNOSIS — I2510 Atherosclerotic heart disease of native coronary artery without angina pectoris: Secondary | ICD-10-CM | POA: Diagnosis not present

## 2017-04-16 DIAGNOSIS — I110 Hypertensive heart disease with heart failure: Secondary | ICD-10-CM | POA: Diagnosis not present

## 2017-04-16 DIAGNOSIS — F329 Major depressive disorder, single episode, unspecified: Secondary | ICD-10-CM | POA: Diagnosis not present

## 2017-04-16 DIAGNOSIS — I272 Pulmonary hypertension, unspecified: Secondary | ICD-10-CM | POA: Diagnosis not present

## 2017-04-16 DIAGNOSIS — I5022 Chronic systolic (congestive) heart failure: Secondary | ICD-10-CM | POA: Diagnosis not present

## 2017-04-16 DIAGNOSIS — F0390 Unspecified dementia without behavioral disturbance: Secondary | ICD-10-CM | POA: Diagnosis not present

## 2017-04-17 DIAGNOSIS — F329 Major depressive disorder, single episode, unspecified: Secondary | ICD-10-CM | POA: Diagnosis not present

## 2017-04-17 DIAGNOSIS — I272 Pulmonary hypertension, unspecified: Secondary | ICD-10-CM | POA: Diagnosis not present

## 2017-04-17 DIAGNOSIS — I2510 Atherosclerotic heart disease of native coronary artery without angina pectoris: Secondary | ICD-10-CM | POA: Diagnosis not present

## 2017-04-17 DIAGNOSIS — I5022 Chronic systolic (congestive) heart failure: Secondary | ICD-10-CM | POA: Diagnosis not present

## 2017-04-17 DIAGNOSIS — F0390 Unspecified dementia without behavioral disturbance: Secondary | ICD-10-CM | POA: Diagnosis not present

## 2017-04-17 DIAGNOSIS — I110 Hypertensive heart disease with heart failure: Secondary | ICD-10-CM | POA: Diagnosis not present

## 2017-04-18 ENCOUNTER — Telehealth: Payer: Self-pay | Admitting: Internal Medicine

## 2017-04-18 ENCOUNTER — Other Ambulatory Visit: Payer: Self-pay | Admitting: Physician Assistant

## 2017-04-18 NOTE — Telephone Encounter (Signed)
[facility], Denny Peonrin called to advise Speech Therapy evaluation was completed. Recommending that patient would benefit from OT and Home Health Aide evaluation. Per Quentin MullingAmanda Collier Faxed order over to have evaluation for OT & Home Health Aide. [phone]

## 2017-04-20 DIAGNOSIS — F0390 Unspecified dementia without behavioral disturbance: Secondary | ICD-10-CM | POA: Diagnosis not present

## 2017-04-20 DIAGNOSIS — I5022 Chronic systolic (congestive) heart failure: Secondary | ICD-10-CM | POA: Diagnosis not present

## 2017-04-20 DIAGNOSIS — F329 Major depressive disorder, single episode, unspecified: Secondary | ICD-10-CM | POA: Diagnosis not present

## 2017-04-20 DIAGNOSIS — I2510 Atherosclerotic heart disease of native coronary artery without angina pectoris: Secondary | ICD-10-CM | POA: Diagnosis not present

## 2017-04-20 DIAGNOSIS — I110 Hypertensive heart disease with heart failure: Secondary | ICD-10-CM | POA: Diagnosis not present

## 2017-04-20 DIAGNOSIS — I272 Pulmonary hypertension, unspecified: Secondary | ICD-10-CM | POA: Diagnosis not present

## 2017-04-24 DIAGNOSIS — F329 Major depressive disorder, single episode, unspecified: Secondary | ICD-10-CM | POA: Diagnosis not present

## 2017-04-24 DIAGNOSIS — I5022 Chronic systolic (congestive) heart failure: Secondary | ICD-10-CM | POA: Diagnosis not present

## 2017-04-24 DIAGNOSIS — F0390 Unspecified dementia without behavioral disturbance: Secondary | ICD-10-CM | POA: Diagnosis not present

## 2017-04-24 DIAGNOSIS — I272 Pulmonary hypertension, unspecified: Secondary | ICD-10-CM | POA: Diagnosis not present

## 2017-04-24 DIAGNOSIS — I110 Hypertensive heart disease with heart failure: Secondary | ICD-10-CM | POA: Diagnosis not present

## 2017-04-24 DIAGNOSIS — I2510 Atherosclerotic heart disease of native coronary artery without angina pectoris: Secondary | ICD-10-CM | POA: Diagnosis not present

## 2017-04-26 DIAGNOSIS — I2510 Atherosclerotic heart disease of native coronary artery without angina pectoris: Secondary | ICD-10-CM | POA: Diagnosis not present

## 2017-04-26 DIAGNOSIS — F0390 Unspecified dementia without behavioral disturbance: Secondary | ICD-10-CM | POA: Diagnosis not present

## 2017-04-26 DIAGNOSIS — I5022 Chronic systolic (congestive) heart failure: Secondary | ICD-10-CM | POA: Diagnosis not present

## 2017-04-26 DIAGNOSIS — F329 Major depressive disorder, single episode, unspecified: Secondary | ICD-10-CM | POA: Diagnosis not present

## 2017-04-26 DIAGNOSIS — I110 Hypertensive heart disease with heart failure: Secondary | ICD-10-CM | POA: Diagnosis not present

## 2017-04-26 DIAGNOSIS — I272 Pulmonary hypertension, unspecified: Secondary | ICD-10-CM | POA: Diagnosis not present

## 2017-04-27 ENCOUNTER — Other Ambulatory Visit: Payer: Self-pay

## 2017-04-27 MED ORDER — BUPROPION HCL ER (XL) 150 MG PO TB24
150.0000 mg | ORAL_TABLET | ORAL | 2 refills | Status: DC
Start: 2017-04-27 — End: 2017-06-18

## 2017-04-30 DIAGNOSIS — F329 Major depressive disorder, single episode, unspecified: Secondary | ICD-10-CM | POA: Diagnosis not present

## 2017-04-30 DIAGNOSIS — I5022 Chronic systolic (congestive) heart failure: Secondary | ICD-10-CM | POA: Diagnosis not present

## 2017-04-30 DIAGNOSIS — I110 Hypertensive heart disease with heart failure: Secondary | ICD-10-CM | POA: Diagnosis not present

## 2017-04-30 DIAGNOSIS — I272 Pulmonary hypertension, unspecified: Secondary | ICD-10-CM | POA: Diagnosis not present

## 2017-04-30 DIAGNOSIS — F0390 Unspecified dementia without behavioral disturbance: Secondary | ICD-10-CM | POA: Diagnosis not present

## 2017-04-30 DIAGNOSIS — I2510 Atherosclerotic heart disease of native coronary artery without angina pectoris: Secondary | ICD-10-CM | POA: Diagnosis not present

## 2017-05-11 DIAGNOSIS — I2510 Atherosclerotic heart disease of native coronary artery without angina pectoris: Secondary | ICD-10-CM | POA: Diagnosis not present

## 2017-05-11 DIAGNOSIS — F329 Major depressive disorder, single episode, unspecified: Secondary | ICD-10-CM | POA: Diagnosis not present

## 2017-05-11 DIAGNOSIS — I110 Hypertensive heart disease with heart failure: Secondary | ICD-10-CM | POA: Diagnosis not present

## 2017-05-11 DIAGNOSIS — I272 Pulmonary hypertension, unspecified: Secondary | ICD-10-CM | POA: Diagnosis not present

## 2017-05-11 DIAGNOSIS — F0390 Unspecified dementia without behavioral disturbance: Secondary | ICD-10-CM | POA: Diagnosis not present

## 2017-05-11 DIAGNOSIS — I5022 Chronic systolic (congestive) heart failure: Secondary | ICD-10-CM | POA: Diagnosis not present

## 2017-05-14 ENCOUNTER — Telehealth: Payer: Self-pay | Admitting: *Deleted

## 2017-05-14 NOTE — Telephone Encounter (Signed)
Per Dr Oneta RackMcKeown, it is OK to extend the patient's PT for 2 times a week x 5 weeks and add OT and social worker evaluations.

## 2017-05-16 DIAGNOSIS — F329 Major depressive disorder, single episode, unspecified: Secondary | ICD-10-CM | POA: Diagnosis not present

## 2017-05-16 DIAGNOSIS — I5022 Chronic systolic (congestive) heart failure: Secondary | ICD-10-CM | POA: Diagnosis not present

## 2017-05-16 DIAGNOSIS — I272 Pulmonary hypertension, unspecified: Secondary | ICD-10-CM | POA: Diagnosis not present

## 2017-05-16 DIAGNOSIS — I2510 Atherosclerotic heart disease of native coronary artery without angina pectoris: Secondary | ICD-10-CM | POA: Diagnosis not present

## 2017-05-16 DIAGNOSIS — I110 Hypertensive heart disease with heart failure: Secondary | ICD-10-CM | POA: Diagnosis not present

## 2017-05-16 DIAGNOSIS — F0390 Unspecified dementia without behavioral disturbance: Secondary | ICD-10-CM | POA: Diagnosis not present

## 2017-05-17 DIAGNOSIS — I2510 Atherosclerotic heart disease of native coronary artery without angina pectoris: Secondary | ICD-10-CM | POA: Diagnosis not present

## 2017-05-17 DIAGNOSIS — I5022 Chronic systolic (congestive) heart failure: Secondary | ICD-10-CM | POA: Diagnosis not present

## 2017-05-17 DIAGNOSIS — F0390 Unspecified dementia without behavioral disturbance: Secondary | ICD-10-CM | POA: Diagnosis not present

## 2017-05-17 DIAGNOSIS — I110 Hypertensive heart disease with heart failure: Secondary | ICD-10-CM | POA: Diagnosis not present

## 2017-05-17 DIAGNOSIS — F329 Major depressive disorder, single episode, unspecified: Secondary | ICD-10-CM | POA: Diagnosis not present

## 2017-05-17 DIAGNOSIS — I272 Pulmonary hypertension, unspecified: Secondary | ICD-10-CM | POA: Diagnosis not present

## 2017-05-21 ENCOUNTER — Telehealth: Payer: Self-pay | Admitting: *Deleted

## 2017-05-21 ENCOUNTER — Other Ambulatory Visit: Payer: Self-pay | Admitting: Physician Assistant

## 2017-05-21 NOTE — Telephone Encounter (Signed)
It is OK to continue PT, per Dr Oneta RackMcKeown.  [facility] is aware.

## 2017-05-22 DIAGNOSIS — F0390 Unspecified dementia without behavioral disturbance: Secondary | ICD-10-CM | POA: Diagnosis not present

## 2017-05-22 DIAGNOSIS — I2510 Atherosclerotic heart disease of native coronary artery without angina pectoris: Secondary | ICD-10-CM | POA: Diagnosis not present

## 2017-05-22 DIAGNOSIS — I110 Hypertensive heart disease with heart failure: Secondary | ICD-10-CM | POA: Diagnosis not present

## 2017-05-22 DIAGNOSIS — F329 Major depressive disorder, single episode, unspecified: Secondary | ICD-10-CM | POA: Diagnosis not present

## 2017-05-22 DIAGNOSIS — I5022 Chronic systolic (congestive) heart failure: Secondary | ICD-10-CM | POA: Diagnosis not present

## 2017-05-22 DIAGNOSIS — I272 Pulmonary hypertension, unspecified: Secondary | ICD-10-CM | POA: Diagnosis not present

## 2017-05-24 ENCOUNTER — Other Ambulatory Visit: Payer: Self-pay | Admitting: Internal Medicine

## 2017-05-24 ENCOUNTER — Other Ambulatory Visit: Payer: Self-pay | Admitting: *Deleted

## 2017-05-24 DIAGNOSIS — F0390 Unspecified dementia without behavioral disturbance: Secondary | ICD-10-CM

## 2017-05-24 DIAGNOSIS — F03A0 Unspecified dementia, mild, without behavioral disturbance, psychotic disturbance, mood disturbance, and anxiety: Secondary | ICD-10-CM

## 2017-05-24 DIAGNOSIS — I10 Essential (primary) hypertension: Secondary | ICD-10-CM

## 2017-05-24 MED ORDER — DONEPEZIL HCL 10 MG PO TABS: ORAL_TABLET | ORAL | 0 refills | Status: DC

## 2017-05-24 MED ORDER — PRAVASTATIN SODIUM 40 MG PO TABS: 40.0000 mg | ORAL_TABLET | Freq: Every day | ORAL | 0 refills | Status: DC

## 2017-05-24 MED ORDER — ALPRAZOLAM 1 MG PO TABS: ORAL_TABLET | ORAL | 0 refills | Status: DC

## 2017-05-24 MED ORDER — AMLODIPINE BESYLATE 10 MG PO TABS: 10.0000 mg | ORAL_TABLET | Freq: Every day | ORAL | 0 refills | Status: DC

## 2017-05-25 DIAGNOSIS — I272 Pulmonary hypertension, unspecified: Secondary | ICD-10-CM | POA: Diagnosis not present

## 2017-05-25 DIAGNOSIS — I110 Hypertensive heart disease with heart failure: Secondary | ICD-10-CM | POA: Diagnosis not present

## 2017-05-25 DIAGNOSIS — F0390 Unspecified dementia without behavioral disturbance: Secondary | ICD-10-CM | POA: Diagnosis not present

## 2017-05-25 DIAGNOSIS — I2510 Atherosclerotic heart disease of native coronary artery without angina pectoris: Secondary | ICD-10-CM | POA: Diagnosis not present

## 2017-05-25 DIAGNOSIS — F329 Major depressive disorder, single episode, unspecified: Secondary | ICD-10-CM | POA: Diagnosis not present

## 2017-05-25 DIAGNOSIS — I5022 Chronic systolic (congestive) heart failure: Secondary | ICD-10-CM | POA: Diagnosis not present

## 2017-05-29 DIAGNOSIS — I5022 Chronic systolic (congestive) heart failure: Secondary | ICD-10-CM | POA: Diagnosis not present

## 2017-05-29 DIAGNOSIS — I272 Pulmonary hypertension, unspecified: Secondary | ICD-10-CM | POA: Diagnosis not present

## 2017-05-29 DIAGNOSIS — F329 Major depressive disorder, single episode, unspecified: Secondary | ICD-10-CM | POA: Diagnosis not present

## 2017-05-29 DIAGNOSIS — I110 Hypertensive heart disease with heart failure: Secondary | ICD-10-CM | POA: Diagnosis not present

## 2017-05-29 DIAGNOSIS — I2510 Atherosclerotic heart disease of native coronary artery without angina pectoris: Secondary | ICD-10-CM | POA: Diagnosis not present

## 2017-05-29 DIAGNOSIS — F0390 Unspecified dementia without behavioral disturbance: Secondary | ICD-10-CM | POA: Diagnosis not present

## 2017-05-31 DIAGNOSIS — I5022 Chronic systolic (congestive) heart failure: Secondary | ICD-10-CM | POA: Diagnosis not present

## 2017-05-31 DIAGNOSIS — F329 Major depressive disorder, single episode, unspecified: Secondary | ICD-10-CM | POA: Diagnosis not present

## 2017-05-31 DIAGNOSIS — I272 Pulmonary hypertension, unspecified: Secondary | ICD-10-CM | POA: Diagnosis not present

## 2017-05-31 DIAGNOSIS — F0390 Unspecified dementia without behavioral disturbance: Secondary | ICD-10-CM | POA: Diagnosis not present

## 2017-05-31 DIAGNOSIS — I110 Hypertensive heart disease with heart failure: Secondary | ICD-10-CM | POA: Diagnosis not present

## 2017-05-31 DIAGNOSIS — I2510 Atherosclerotic heart disease of native coronary artery without angina pectoris: Secondary | ICD-10-CM | POA: Diagnosis not present

## 2017-06-05 DIAGNOSIS — I5022 Chronic systolic (congestive) heart failure: Secondary | ICD-10-CM | POA: Diagnosis not present

## 2017-06-05 DIAGNOSIS — I110 Hypertensive heart disease with heart failure: Secondary | ICD-10-CM | POA: Diagnosis not present

## 2017-06-05 DIAGNOSIS — F0390 Unspecified dementia without behavioral disturbance: Secondary | ICD-10-CM | POA: Diagnosis not present

## 2017-06-05 DIAGNOSIS — F329 Major depressive disorder, single episode, unspecified: Secondary | ICD-10-CM | POA: Diagnosis not present

## 2017-06-05 DIAGNOSIS — I272 Pulmonary hypertension, unspecified: Secondary | ICD-10-CM | POA: Diagnosis not present

## 2017-06-05 DIAGNOSIS — I2510 Atherosclerotic heart disease of native coronary artery without angina pectoris: Secondary | ICD-10-CM | POA: Diagnosis not present

## 2017-06-07 DIAGNOSIS — I2510 Atherosclerotic heart disease of native coronary artery without angina pectoris: Secondary | ICD-10-CM | POA: Diagnosis not present

## 2017-06-07 DIAGNOSIS — F329 Major depressive disorder, single episode, unspecified: Secondary | ICD-10-CM | POA: Diagnosis not present

## 2017-06-07 DIAGNOSIS — I272 Pulmonary hypertension, unspecified: Secondary | ICD-10-CM | POA: Diagnosis not present

## 2017-06-07 DIAGNOSIS — I110 Hypertensive heart disease with heart failure: Secondary | ICD-10-CM | POA: Diagnosis not present

## 2017-06-07 DIAGNOSIS — I5022 Chronic systolic (congestive) heart failure: Secondary | ICD-10-CM | POA: Diagnosis not present

## 2017-06-07 DIAGNOSIS — F0390 Unspecified dementia without behavioral disturbance: Secondary | ICD-10-CM | POA: Diagnosis not present

## 2017-06-12 DIAGNOSIS — I272 Pulmonary hypertension, unspecified: Secondary | ICD-10-CM | POA: Diagnosis not present

## 2017-06-12 DIAGNOSIS — F329 Major depressive disorder, single episode, unspecified: Secondary | ICD-10-CM | POA: Diagnosis not present

## 2017-06-12 DIAGNOSIS — F0390 Unspecified dementia without behavioral disturbance: Secondary | ICD-10-CM | POA: Diagnosis not present

## 2017-06-12 DIAGNOSIS — I5022 Chronic systolic (congestive) heart failure: Secondary | ICD-10-CM | POA: Diagnosis not present

## 2017-06-12 DIAGNOSIS — I2510 Atherosclerotic heart disease of native coronary artery without angina pectoris: Secondary | ICD-10-CM | POA: Diagnosis not present

## 2017-06-12 DIAGNOSIS — I110 Hypertensive heart disease with heart failure: Secondary | ICD-10-CM | POA: Diagnosis not present

## 2017-06-14 DIAGNOSIS — I5022 Chronic systolic (congestive) heart failure: Secondary | ICD-10-CM | POA: Diagnosis not present

## 2017-06-14 DIAGNOSIS — I110 Hypertensive heart disease with heart failure: Secondary | ICD-10-CM | POA: Diagnosis not present

## 2017-06-14 DIAGNOSIS — I2510 Atherosclerotic heart disease of native coronary artery without angina pectoris: Secondary | ICD-10-CM | POA: Diagnosis not present

## 2017-06-14 DIAGNOSIS — F329 Major depressive disorder, single episode, unspecified: Secondary | ICD-10-CM | POA: Diagnosis not present

## 2017-06-14 DIAGNOSIS — F0390 Unspecified dementia without behavioral disturbance: Secondary | ICD-10-CM | POA: Diagnosis not present

## 2017-06-14 DIAGNOSIS — I272 Pulmonary hypertension, unspecified: Secondary | ICD-10-CM | POA: Diagnosis not present

## 2017-06-18 ENCOUNTER — Other Ambulatory Visit: Payer: Self-pay | Admitting: *Deleted

## 2017-06-18 MED ORDER — BUPROPION HCL ER (XL) 150 MG PO TB24: 150.0000 mg | ORAL_TABLET | ORAL | 0 refills | Status: DC

## 2017-06-25 ENCOUNTER — Other Ambulatory Visit: Payer: Self-pay | Admitting: Internal Medicine

## 2017-06-25 MED ORDER — SERTRALINE HCL 50 MG PO TABS: ORAL_TABLET | ORAL | 1 refills | Status: DC

## 2017-06-29 ENCOUNTER — Other Ambulatory Visit: Payer: Self-pay | Admitting: Internal Medicine

## 2017-06-30 ENCOUNTER — Other Ambulatory Visit: Payer: Self-pay | Admitting: Internal Medicine

## 2017-07-02 ENCOUNTER — Other Ambulatory Visit: Payer: Self-pay | Admitting: Internal Medicine

## 2017-07-02 DIAGNOSIS — F329 Major depressive disorder, single episode, unspecified: Secondary | ICD-10-CM

## 2017-07-02 DIAGNOSIS — E782 Mixed hyperlipidemia: Secondary | ICD-10-CM

## 2017-07-02 DIAGNOSIS — I10 Essential (primary) hypertension: Secondary | ICD-10-CM

## 2017-07-02 DIAGNOSIS — D6859 Other primary thrombophilia: Secondary | ICD-10-CM

## 2017-07-02 DIAGNOSIS — F0390 Unspecified dementia without behavioral disturbance: Secondary | ICD-10-CM

## 2017-07-02 DIAGNOSIS — F03A0 Unspecified dementia, mild, without behavioral disturbance, psychotic disturbance, mood disturbance, and anxiety: Secondary | ICD-10-CM

## 2017-07-02 DIAGNOSIS — F32A Depression, unspecified: Secondary | ICD-10-CM

## 2017-07-02 DIAGNOSIS — K219 Gastro-esophageal reflux disease without esophagitis: Secondary | ICD-10-CM

## 2017-07-02 DIAGNOSIS — N401 Enlarged prostate with lower urinary tract symptoms: Secondary | ICD-10-CM

## 2017-07-02 MED ORDER — RIVAROXABAN 20 MG PO TABS: ORAL_TABLET | ORAL | 1 refills | Status: DC

## 2017-07-02 MED ORDER — PRAVASTATIN SODIUM 40 MG PO TABS: ORAL_TABLET | ORAL | 0 refills | Status: DC

## 2017-07-02 MED ORDER — FUROSEMIDE 40 MG PO TABS: ORAL_TABLET | ORAL | 0 refills | Status: DC

## 2017-07-02 MED ORDER — RANITIDINE HCL 300 MG PO TABS: 300.0000 mg | ORAL_TABLET | Freq: Every day | ORAL | 0 refills | Status: DC | PRN

## 2017-07-02 MED ORDER — CARVEDILOL 25 MG PO TABS: ORAL_TABLET | ORAL | 0 refills | Status: DC

## 2017-07-02 MED ORDER — LISINOPRIL 30 MG PO TABS: ORAL_TABLET | ORAL | 0 refills | Status: DC

## 2017-07-02 MED ORDER — SERTRALINE HCL 50 MG PO TABS: ORAL_TABLET | ORAL | 0 refills | Status: DC

## 2017-07-02 MED ORDER — TAMSULOSIN HCL 0.4 MG PO CAPS: ORAL_CAPSULE | ORAL | 0 refills | Status: DC

## 2017-07-02 MED ORDER — DONEPEZIL HCL 10 MG PO TABS: ORAL_TABLET | ORAL | 0 refills | Status: DC

## 2017-07-02 MED ORDER — AMLODIPINE BESYLATE 10 MG PO TABS: ORAL_TABLET | ORAL | 0 refills | Status: DC

## 2017-07-02 MED ORDER — BUPROPION HCL ER (XL) 150 MG PO TB24: ORAL_TABLET | ORAL | 0 refills | Status: DC

## 2017-07-03 ENCOUNTER — Encounter: Payer: Self-pay | Admitting: Physician Assistant

## 2017-07-03 ENCOUNTER — Ambulatory Visit: Payer: Medicare HMO | Admitting: Physician Assistant

## 2017-07-03 VITALS — BP 124/78 | HR 90 | Temp 97.6°F | Resp 14 | Ht 66.5 in | Wt 155.4 lb

## 2017-07-03 DIAGNOSIS — F32A Depression, unspecified: Secondary | ICD-10-CM

## 2017-07-03 DIAGNOSIS — N401 Enlarged prostate with lower urinary tract symptoms: Secondary | ICD-10-CM

## 2017-07-03 DIAGNOSIS — I110 Hypertensive heart disease with heart failure: Secondary | ICD-10-CM | POA: Diagnosis not present

## 2017-07-03 DIAGNOSIS — F329 Major depressive disorder, single episode, unspecified: Secondary | ICD-10-CM | POA: Diagnosis not present

## 2017-07-03 DIAGNOSIS — I5022 Chronic systolic (congestive) heart failure: Secondary | ICD-10-CM | POA: Diagnosis not present

## 2017-07-03 DIAGNOSIS — I272 Pulmonary hypertension, unspecified: Secondary | ICD-10-CM

## 2017-07-03 DIAGNOSIS — K219 Gastro-esophageal reflux disease without esophagitis: Secondary | ICD-10-CM | POA: Diagnosis not present

## 2017-07-03 DIAGNOSIS — E782 Mixed hyperlipidemia: Secondary | ICD-10-CM | POA: Diagnosis not present

## 2017-07-03 DIAGNOSIS — F0390 Unspecified dementia without behavioral disturbance: Secondary | ICD-10-CM | POA: Diagnosis not present

## 2017-07-03 DIAGNOSIS — D6859 Other primary thrombophilia: Secondary | ICD-10-CM

## 2017-07-03 DIAGNOSIS — F03A0 Unspecified dementia, mild, without behavioral disturbance, psychotic disturbance, mood disturbance, and anxiety: Secondary | ICD-10-CM

## 2017-07-03 DIAGNOSIS — I10 Essential (primary) hypertension: Secondary | ICD-10-CM | POA: Diagnosis not present

## 2017-07-03 LAB — BASIC METABOLIC PANEL WITH GFR
BUN/Creatinine Ratio: 22 (calc) (ref 6–22)
BUN: 31 mg/dL — ABNORMAL HIGH (ref 7–25)
CO2: 29 mmol/L (ref 20–32)
CREATININE: 1.39 mg/dL — AB (ref 0.70–1.25)
Calcium: 10.3 mg/dL (ref 8.6–10.3)
Chloride: 110 mmol/L (ref 98–110)
GFR, EST NON AFRICAN AMERICAN: 51 mL/min/{1.73_m2} — AB (ref >=60)
GFR, Est African American: 60 mL/min/{1.73_m2} (ref >=60)
Glucose, Bld: 92 mg/dL (ref 65–99)
Potassium: 4.4 mmol/L (ref 3.5–5.3)
SODIUM: 145 mmol/L (ref 135–146)

## 2017-07-03 LAB — HEPATIC FUNCTION PANEL
AG RATIO: 1.5 (calc) (ref 1.0–2.5)
ALKALINE PHOSPHATASE (APISO): 74 U/L (ref 40–115)
ALT: 9 U/L (ref 9–46)
AST: 13 U/L (ref 10–35)
Albumin: 4.3 g/dL (ref 3.6–5.1)
BILIRUBIN TOTAL: 0.4 mg/dL (ref 0.2–1.2)
Bilirubin, Direct: 0.1 mg/dL (ref 0.0–0.2)
GLOBULIN: 2.9 g/dL (ref 1.9–3.7)
Indirect Bilirubin: 0.3 mg/dL (calc) (ref 0.2–1.2)
Total Protein: 7.2 g/dL (ref 6.1–8.1)

## 2017-07-03 LAB — LIPID PANEL
CHOL/HDL RATIO: 2.9 (calc) (ref <5.0)
Cholesterol: 142 mg/dL (ref <200)
HDL: 49 mg/dL (ref >40)
LDL CHOLESTEROL (CALC): 72 mg/dL
NON-HDL CHOLESTEROL (CALC): 93 mg/dL (ref <130)
Triglycerides: 126 mg/dL (ref <150)

## 2017-07-03 LAB — CBC WITH DIFFERENTIAL/PLATELET
BASOS ABS: 45 {cells}/uL (ref 0–200)
Basophils Relative: 0.4 %
EOS PCT: 2 %
Eosinophils Absolute: 226 cells/uL (ref 15–500)
HEMATOCRIT: 39.5 % (ref 38.5–50.0)
HEMOGLOBIN: 13.2 g/dL (ref 13.2–17.1)
LYMPHS ABS: 1819 {cells}/uL (ref 850–3900)
MCH: 28.6 pg (ref 27.0–33.0)
MCHC: 33.4 g/dL (ref 32.0–36.0)
MCV: 85.7 fL (ref 80.0–100.0)
MPV: 10.9 fL (ref 7.5–12.5)
Monocytes Relative: 8.9 %
NEUTROS ABS: 8204 {cells}/uL — AB (ref 1500–7800)
Neutrophils Relative %: 72.6 %
Platelets: 199 10*3/uL (ref 140–400)
RBC: 4.61 10*6/uL (ref 4.20–5.80)
RDW: 14.6 % (ref 11.0–15.0)
Total Lymphocyte: 16.1 %
WBC mixed population: 1006 cells/uL — ABNORMAL HIGH (ref 200–950)
WBC: 11.3 10*3/uL — ABNORMAL HIGH (ref 3.8–10.8)

## 2017-07-03 LAB — TSH: TSH: 0.78 mIU/L (ref 0.40–4.50)

## 2017-07-03 MED ORDER — DONEPEZIL HCL 10 MG PO TABS: ORAL_TABLET | ORAL | 1 refills | Status: DC

## 2017-07-03 MED ORDER — CARVEDILOL 25 MG PO TABS: ORAL_TABLET | ORAL | 1 refills | Status: DC

## 2017-07-03 MED ORDER — SERTRALINE HCL 50 MG PO TABS: ORAL_TABLET | ORAL | 1 refills | Status: DC

## 2017-07-03 MED ORDER — BUPROPION HCL ER (XL) 150 MG PO TB24: ORAL_TABLET | ORAL | 1 refills | Status: DC

## 2017-07-03 MED ORDER — LISINOPRIL 30 MG PO TABS: ORAL_TABLET | ORAL | 1 refills | Status: DC

## 2017-07-03 MED ORDER — TAMSULOSIN HCL 0.4 MG PO CAPS: ORAL_CAPSULE | ORAL | 1 refills | Status: DC

## 2017-07-03 MED ORDER — PRAVASTATIN SODIUM 40 MG PO TABS: ORAL_TABLET | ORAL | 1 refills | Status: DC

## 2017-07-03 MED ORDER — AMLODIPINE BESYLATE 10 MG PO TABS: ORAL_TABLET | ORAL | 1 refills | Status: DC

## 2017-07-03 MED ORDER — RANITIDINE HCL 300 MG PO TABS: 300.0000 mg | ORAL_TABLET | Freq: Every day | ORAL | 1 refills | Status: DC | PRN

## 2017-07-03 MED ORDER — RIVAROXABAN 20 MG PO TABS: ORAL_TABLET | ORAL | 1 refills | Status: DC

## 2017-07-03 MED ORDER — FUROSEMIDE 40 MG PO TABS: ORAL_TABLET | ORAL | 1 refills | Status: DC
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
## 2017-07-03 NOTE — Patient Instructions (Addendum)
Go to social security office to get information about different services that are available  Way self daily and keep record  Do the following things EVERYDAY: 1) Weigh yourself in the morning before breakfast or at the same time every day. Write it down and keep it in a log. 2) Take your medicines as prescribed 3) Eat low salt foods-Limit salt (sodium) to 2000 mg per day. Best thing to do is avoid processed foods.   4) Stay as active as you can everyday 5) Limit all fluids for the day to less than 1.5 liters  Call your doctor if:  Anytime you have any of the following symptoms:  1) 2 pound weight gain in 24 hours or 5 pounds in 1 week  2) shortness of breath, with or without a dry hacking cough  3) swelling in the hands, LEGs, feet or stomach  4) if you have to sleep on extra pillows at night in order to breathe. 5) after laying down at night for 20-30 mins, you wake up short of breath.   These can all be signs of fluid overload.    Heart Failure Heart failure means your heart has trouble pumping blood. This makes it hard for your body to work well. Heart failure is usually a long-term (chronic) condition. You must take good care of yourself and follow your doctor's treatment plan. Follow these instructions at home:  Take your heart medicine as told by your doctor. ? Do not stop taking medicine unless your doctor tells you to. ? Do not skip any dose of medicine. ? Refill your medicines before they run out. ? Take other medicines only as told by your doctor or pharmacist.  Stay active if told by your doctor. The elderly and people with severe heart failure should talk with a doctor about physical activity.  Eat heart-healthy foods. Choose foods that are without trans fat and are low in saturated fat, cholesterol, and salt (sodium). This includes fresh or frozen fruits and vegetables, fish, lean meats, fat-free or low-fat dairy foods, whole grains, and high-fiber foods. Lentils and  dried peas and beans (legumes) are also good choices.  Limit salt if told by your doctor.  Cook in a healthy way. Roast, grill, broil, bake, poach, steam, or stir-fry foods.  Limit fluids as told by your doctor.  Weigh yourself every morning. Do this after you pee (urinate) and before you eat breakfast. Write down your weight to give to your doctor.  Take your blood pressure and write it down if your doctor tells you to.  Ask your doctor how to check your pulse. Check your pulse as told.  Lose weight if told by your doctor.  Stop smoking or chewing tobacco. Do not use gum or patches that help you quit without your doctor's approval.  Schedule and go to doctor visits as told.  Nonpregnant women should have no more than 1 drink a day. Men should have no more than 2 drinks a day. Talk to your doctor about drinking alcohol.  Stop illegal drug use.  Stay current with shots (immunizations).  Manage your health conditions as told by your doctor.  Learn to manage your stress.  Rest when you are tired.  If it is really hot outside: ? Avoid intense activities. ? Use air conditioning or fans, or get in a cooler place. ? Avoid caffeine and alcohol. ? Wear loose-fitting, lightweight, and light-colored clothing.  If it is really cold outside: ? Avoid intense activities. ?  Layer your clothing. ? Wear mittens or gloves, a hat, and a scarf when going outside. ? Avoid alcohol.  Learn about heart failure and get support as needed.  Get help to maintain or improve your quality of life and your ability to care for yourself as needed. Contact a doctor if:  You gain weight quickly.  You are more short of breath than usual.  You cannot do your normal activities.  You tire easily.  You cough more than normal, especially with activity.  You have any or more puffiness (swelling) in areas such as your hands, feet, ankles, or belly (abdomen).  You cannot sleep because it is hard to  breathe.  You feel like your heart is beating fast (palpitations).  You get dizzy or light-headed when you stand up. Get help right away if:  You have trouble breathing.  There is a change in mental status, such as becoming less alert or not being able to focus.  You have chest pain or discomfort.  You faint. This information is not intended to replace advice given to you by your health care provider. Make sure you discuss any questions you have with your health care provider. Document Released: 02/08/2008 Document Revised: 10/07/2015 Document Reviewed: 06/17/2012 Elsevier Interactive Patient Education  2017 [employer]     Vascular Dementia Dementia is a condition in which a person has problems with thinking, memory, and behavior that are severe enough to interfere with daily life. Vascular dementia is a type of dementia. It results from brain damage that is caused by the brain not getting enough blood. Vascular dementia usually begins between 26 and 65 years of age. What are the causes? Vascular dementia is caused by conditions that lessen blood flow to the brain. Common causes include:  Multiple small strokes. These may happen without symptoms (silent stroke).  Major stroke.  Damage to small blood vessels in the brain (cerebral small vessel disease).  What increases the risk?  Advancing age.  Having had a stroke.  Having high blood pressure (hypertension) or high cholesterol.  Having a disease that affects the heart or blood vessels.  Smoking.  Having diabetes.  Being male.  Being obese.  Not being active.  Having depression. What are the signs or symptoms? Symptoms can vary a lot from one person to another. Symptoms may be mild or severe depending on the amount of damage and which parts of the brain have been affected. Symptoms may begin suddenly or may develop gradually. Symptoms may remain stable, or they may get worse over time. Symptoms of vascular  dementia may be similar to those of Alzheimer disease. The two conditions can occur together (mixed dementia). Symptoms of vascular dementia may include: Mental  Confusion.  Memory problems.  Poor attention and concentration.  Trouble understanding speech.  Depression.  Personality changes.  Trouble recognizing familiar people.  Agitation or aggression.  Paranoia.  Delusions or hallucinations. Physical  Weakness.  Poor balance.  Loss of bladder or bowel control (incontinence).  Unsteady walking (gait).  Speaking problems. Behavioral  Getting lost in familiar places.  Problems with planning and judgment.  Trouble following instructions.  Social problems.  Emotional outbursts.  Trouble with daily activities and self-care.  Problems handling money. How is this diagnosed? There is not a specific test to diagnose vascular dementia. The health care provider will consider the person's medical history and symptoms or changes that are reported by friends and family. The health care provider will do a physical exam  and may order lab tests or other tests that check brain and nervous system function. Tests that may be done include:  Blood tests.  Brain imaging tests.  Tests of movement, speech, and other daily activities (neurological exam).  Tests of memory, thinking, and problem-solving (neuropsychological or neurocognitive testing).  Diagnosis may involve several specialists. These may include a health care provider who specializes in the brain and nervous system (neurologist), a provider who specializes in disorders of the mind (psychiatrist), and a provider who focuses on speech and language changes (Doctor, general practice). How is this treated? There is no cure for vascular dementia. Brain damage that has already occurred cannot be reversed. Treatment depends on:  How severe the condition is.  Which parts of the brain have been affected.  The person's overall  health.  Treatment measures aim to:  Treat the underlying cause of vascular dementia and manage risk factors. This may include: ? Controlling blood pressure. ? Lowering cholesterol. ? Treating diabetes. ? Quitting smoking. ? Losing weight.  Manage symptoms.  Prevent further brain damage.  Improve the person's health and quality of life.  Treatment for dementia may involve a team of health care providers, including:  A neurologist.  A psychiatrist.  An occupational therapist.  A speech pathologist.  A cardiologist.  An exercise physiologist or physical therapist.  Follow these instructions at home: Home care for a person with vascular dementia depends on what caused the condition and how severe the symptoms are. General guidelines for care at home include:  Following the health care provider's instructions for treating the condition that caused the dementia.  Using medicines only as told by the person's health care provider.  Creating a safe living space.  Learning ways to help the person remember people, appointments, and daily activities.  Finding a support group to help caregivers and family to cope with the effects of dementia.  Helping family and friends learn about ways to communicate with a person who has dementia.  Making sure the person keeps all follow-up visits and goes to all rehabilitation appointments as told by the health care team. This is important.  Contact a health care provider if:  A fever develops.  New behavioral problems develop.  Problems with swallowing develop.  Confusion gets worse.  Sleepiness gets worse. Get help right away if:  Loss of consciousness occurs.  There is a sudden loss of speech, balance, or thinking ability.  New numbness or paralysis occurs.  Sudden, severe headache occurs.  Vision is lost or suddenly gets worse in one or both eyes. This information is not intended to replace advice given to you by your  health care provider. Make sure you discuss any questions you have with your health care provider. Document Released: 04/21/2002 Document Revised: 10/07/2015 Document Reviewed: 08/12/2014 Elsevier Interactive Patient Education  2018 [employer].

## 2017-07-05 ENCOUNTER — Ambulatory Visit (INDEPENDENT_AMBULATORY_CARE_PROVIDER_SITE_OTHER): Payer: Medicare HMO

## 2017-07-05 ENCOUNTER — Ambulatory Visit: Payer: Self-pay | Admitting: Internal Medicine

## 2017-07-05 DIAGNOSIS — Z79899 Other long term (current) drug therapy: Secondary | ICD-10-CM | POA: Diagnosis not present

## 2017-07-06 NOTE — Progress Notes (Signed)
Pt's caregiver presented for med check. The meds were id & instructions on how to take & what time to take the meds were printed out & then re-read over just to make sure it was understood. Pt's caregiver voiced understanding & checked out.

## 2017-07-09 ENCOUNTER — Other Ambulatory Visit: Payer: Self-pay | Admitting: Internal Medicine

## 2017-07-11 ENCOUNTER — Other Ambulatory Visit: Payer: Self-pay | Admitting: Internal Medicine

## 2017-07-11 DIAGNOSIS — F411 Generalized anxiety disorder: Secondary | ICD-10-CM

## 2017-07-12 ENCOUNTER — Telehealth: Payer: Self-pay | Admitting: Physician Assistant

## 2017-07-12 MED ORDER — ALPRAZOLAM 1 MG PO TABS: ORAL_TABLET | ORAL | 0 refills | Status: DC
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
## 2017-07-19 ENCOUNTER — Other Ambulatory Visit: Payer: Self-pay | Admitting: Internal Medicine

## 2017-07-19 DIAGNOSIS — E782 Mixed hyperlipidemia: Secondary | ICD-10-CM

## 2017-07-19 DIAGNOSIS — F0390 Unspecified dementia without behavioral disturbance: Secondary | ICD-10-CM

## 2017-07-19 DIAGNOSIS — N401 Enlarged prostate with lower urinary tract symptoms: Secondary | ICD-10-CM

## 2017-07-19 DIAGNOSIS — I10 Essential (primary) hypertension: Secondary | ICD-10-CM

## 2017-07-19 DIAGNOSIS — F03A0 Unspecified dementia, mild, without behavioral disturbance, psychotic disturbance, mood disturbance, and anxiety: Secondary | ICD-10-CM

## 2017-08-02 ENCOUNTER — Other Ambulatory Visit: Payer: Self-pay | Admitting: Internal Medicine

## 2017-08-02 DIAGNOSIS — F411 Generalized anxiety disorder: Secondary | ICD-10-CM

## 2017-08-08 ENCOUNTER — Other Ambulatory Visit: Payer: Self-pay | Admitting: Physician Assistant

## 2017-08-08 DIAGNOSIS — F411 Generalized anxiety disorder: Secondary | ICD-10-CM

## 2017-08-20 ENCOUNTER — Telehealth: Payer: Self-pay

## 2017-08-20 NOTE — Telephone Encounter (Signed)
[phone]   Reports that the pt's sister in law will not return pt's meds, since they went to court-[facility] told her to call his doctor's office for samples.  As per the provider we would like to get the social workers information so that we may speak with the worker, also we can send the pt a refill on his meds to his local pharmacy.  LVM April 8th 2019 at 3:28pm by DD

## 2017-09-04 ENCOUNTER — Other Ambulatory Visit: Payer: Self-pay

## 2017-09-04 DIAGNOSIS — I10 Essential (primary) hypertension: Secondary | ICD-10-CM

## 2017-09-04 MED ORDER — FUROSEMIDE 40 MG PO TABS
ORAL_TABLET | ORAL | 1 refills | Status: DC
Start: 2017-09-04 — End: 2017-09-13

## 2017-09-05 ENCOUNTER — Other Ambulatory Visit: Payer: Self-pay | Admitting: Internal Medicine

## 2017-09-05 DIAGNOSIS — I10 Essential (primary) hypertension: Secondary | ICD-10-CM

## 2017-09-05 MED ORDER — CARVEDILOL 25 MG PO TABS: ORAL_TABLET | ORAL | 1 refills | Status: DC

## 2017-09-13 ENCOUNTER — Encounter: Payer: Self-pay | Admitting: Physician Assistant

## 2017-09-13 ENCOUNTER — Ambulatory Visit: Payer: Medicare HMO | Admitting: Physician Assistant

## 2017-09-13 ENCOUNTER — Other Ambulatory Visit: Payer: Self-pay

## 2017-09-13 VITALS — BP 122/78 | HR 61 | Temp 97.9°F | Resp 14 | Ht 66.5 in | Wt 151.8 lb

## 2017-09-13 DIAGNOSIS — E782 Mixed hyperlipidemia: Secondary | ICD-10-CM

## 2017-09-13 DIAGNOSIS — F0390 Unspecified dementia without behavioral disturbance: Secondary | ICD-10-CM

## 2017-09-13 DIAGNOSIS — M1000 Idiopathic gout, unspecified site: Secondary | ICD-10-CM

## 2017-09-13 DIAGNOSIS — Z86718 Personal history of other venous thrombosis and embolism: Secondary | ICD-10-CM | POA: Diagnosis not present

## 2017-09-13 DIAGNOSIS — Z91199 Patient's noncompliance with other medical treatment and regimen due to unspecified reason: Secondary | ICD-10-CM

## 2017-09-13 DIAGNOSIS — Z86711 Personal history of pulmonary embolism: Secondary | ICD-10-CM | POA: Diagnosis not present

## 2017-09-13 DIAGNOSIS — Z9119 Patient's noncompliance with other medical treatment and regimen: Secondary | ICD-10-CM | POA: Diagnosis not present

## 2017-09-13 DIAGNOSIS — I272 Pulmonary hypertension, unspecified: Secondary | ICD-10-CM

## 2017-09-13 DIAGNOSIS — N401 Enlarged prostate with lower urinary tract symptoms: Secondary | ICD-10-CM

## 2017-09-13 DIAGNOSIS — I10 Essential (primary) hypertension: Secondary | ICD-10-CM

## 2017-09-13 DIAGNOSIS — F329 Major depressive disorder, single episode, unspecified: Secondary | ICD-10-CM

## 2017-09-13 DIAGNOSIS — I110 Hypertensive heart disease with heart failure: Secondary | ICD-10-CM

## 2017-09-13 DIAGNOSIS — I5022 Chronic systolic (congestive) heart failure: Secondary | ICD-10-CM

## 2017-09-13 DIAGNOSIS — E559 Vitamin D deficiency, unspecified: Secondary | ICD-10-CM

## 2017-09-13 DIAGNOSIS — Z0001 Encounter for general adult medical examination with abnormal findings: Secondary | ICD-10-CM | POA: Diagnosis not present

## 2017-09-13 DIAGNOSIS — R6889 Other general symptoms and signs: Secondary | ICD-10-CM

## 2017-09-13 DIAGNOSIS — Z0000 Encounter for general adult medical examination without abnormal findings: Secondary | ICD-10-CM

## 2017-09-13 DIAGNOSIS — Z79899 Other long term (current) drug therapy: Secondary | ICD-10-CM

## 2017-09-13 DIAGNOSIS — D6859 Other primary thrombophilia: Secondary | ICD-10-CM

## 2017-09-13 DIAGNOSIS — Z9181 History of falling: Secondary | ICD-10-CM

## 2017-09-13 DIAGNOSIS — F32A Depression, unspecified: Secondary | ICD-10-CM

## 2017-09-13 DIAGNOSIS — F03A0 Unspecified dementia, mild, without behavioral disturbance, psychotic disturbance, mood disturbance, and anxiety: Secondary | ICD-10-CM

## 2017-09-13 DIAGNOSIS — F411 Generalized anxiety disorder: Secondary | ICD-10-CM

## 2017-09-13 DIAGNOSIS — R350 Frequency of micturition: Secondary | ICD-10-CM

## 2017-09-13 LAB — CBC WITH DIFFERENTIAL/PLATELET
BASOS PCT: 0.5 %
Basophils Absolute: 43 cells/uL (ref 0–200)
EOS PCT: 2.7 %
Eosinophils Absolute: 230 cells/uL (ref 15–500)
HEMATOCRIT: 38.6 % (ref 38.5–50.0)
HEMOGLOBIN: 13.1 g/dL — AB (ref 13.2–17.1)
LYMPHS ABS: 2032 {cells}/uL (ref 850–3900)
MCH: 29.6 pg (ref 27.0–33.0)
MCHC: 33.9 g/dL (ref 32.0–36.0)
MCV: 87.3 fL (ref 80.0–100.0)
MPV: 11.1 fL (ref 7.5–12.5)
Monocytes Relative: 9.1 %
Neutro Abs: 5423 cells/uL (ref 1500–7800)
Neutrophils Relative %: 63.8 %
Platelets: 159 10*3/uL (ref 140–400)
RBC: 4.42 10*6/uL (ref 4.20–5.80)
RDW: 16.2 % — AB (ref 11.0–15.0)
Total Lymphocyte: 23.9 %
WBC mixed population: 774 cells/uL (ref 200–950)
WBC: 8.5 10*3/uL (ref 3.8–10.8)

## 2017-09-13 LAB — TSH: TSH: 0.79 mIU/L (ref 0.40–4.50)

## 2017-09-13 MED ORDER — BUPROPION HCL ER (XL) 150 MG PO TB24: ORAL_TABLET | ORAL | 1 refills | Status: DC

## 2017-09-13 MED ORDER — FUROSEMIDE 40 MG PO TABS
ORAL_TABLET | ORAL | 1 refills | Status: DC
Start: 2017-09-13 — End: 2017-09-14

## 2017-09-13 MED ORDER — TAMSULOSIN HCL 0.4 MG PO CAPS: ORAL_CAPSULE | ORAL | 1 refills | Status: DC

## 2017-09-13 MED ORDER — AMLODIPINE BESYLATE 10 MG PO TABS: ORAL_TABLET | ORAL | 1 refills | Status: DC

## 2017-09-13 MED ORDER — DONEPEZIL HCL 10 MG PO TABS: ORAL_TABLET | ORAL | 1 refills | Status: DC

## 2017-09-13 MED ORDER — LISINOPRIL 30 MG PO TABS
ORAL_TABLET | ORAL | 1 refills | Status: DC
Start: 2017-09-13 — End: 2017-09-14

## 2017-09-13 MED ORDER — CARVEDILOL 25 MG PO TABS: ORAL_TABLET | ORAL | 1 refills | Status: DC

## 2017-09-13 MED ORDER — SERTRALINE HCL 50 MG PO TABS: ORAL_TABLET | ORAL | 1 refills | Status: DC

## 2017-09-13 MED ORDER — PRAVASTATIN SODIUM 40 MG PO TABS: ORAL_TABLET | ORAL | 1 refills | Status: DC

## 2017-09-13 MED ORDER — RIVAROXABAN 20 MG PO TABS: ORAL_TABLET | ORAL | 1 refills | Status: DC

## 2017-09-13 NOTE — Patient Instructions (Addendum)
We are going to add back on low lisinopril, aricept, pravastatin Please check to make sure that Francine Graven is coming to your house  Suggest checking home for fall risk.   Weights daily  Do the following things EVERYDAY: 1) Weigh yourself in the morning before breakfast or at the same time every day. Write it down and keep it in a log. 2) Take your medicines as prescribed 3) Eat low salt foods-Limit salt (sodium) to 2000 mg per day. Best thing to do is avoid processed foods.   4) Stay as active as you can everyday 5) Limit all fluids for the day to less than 1.5 liters  Call your doctor if:  Anytime you have any of the following symptoms:  1) 2 pound weight gain in 24 hours or 5 pounds in 1 week  2) shortness of breath, with or without a dry hacking cough  3) swelling in the hands, LEGs, feet or stomach  4) if you have to sleep on extra pillows at night in order to breathe. 5) after laying down at night for 20-30 mins, you wake up short of breath.   These can all be signs of fluid overload.    Heart Failure Heart failure means your heart has trouble pumping blood. This makes it hard for your body to work well. Heart failure is usually a long-term (chronic) condition. You must take good care of yourself and follow your doctor's treatment plan. Follow these instructions at home:  Take your heart medicine as told by your doctor. ? Do not stop taking medicine unless your doctor tells you to. ? Do not skip any dose of medicine. ? Refill your medicines before they run out. ? Take other medicines only as told by your doctor or pharmacist.  Stay active if told by your doctor. The elderly and people with severe heart failure should talk with a doctor about physical activity.  Eat heart-healthy foods. Choose foods that are without trans fat and are low in saturated fat, cholesterol, and salt (sodium). This includes fresh or frozen fruits and vegetables, fish, lean meats, fat-free or low-fat  dairy foods, whole grains, and high-fiber foods. Lentils and dried peas and beans (legumes) are also good choices.  Limit salt if told by your doctor.  Cook in a healthy way. Roast, grill, broil, bake, poach, steam, or stir-fry foods.  Limit fluids as told by your doctor.  Weigh yourself every morning. Do this after you pee (urinate) and before you eat breakfast. Write down your weight to give to your doctor.  Take your blood pressure and write it down if your doctor tells you to.  Ask your doctor how to check your pulse. Check your pulse as told.  Lose weight if told by your doctor.  Stop smoking or chewing tobacco. Do not use gum or patches that help you quit without your doctor's approval.  Schedule and go to doctor visits as told.  Nonpregnant women should have no more than 1 drink a day. Men should have no more than 2 drinks a day. Talk to your doctor about drinking alcohol.  Stop illegal drug use.  Stay current with shots (immunizations).  Manage your health conditions as told by your doctor.  Learn to manage your stress.  Rest when you are tired.  If it is really hot outside: ? Avoid intense activities. ? Use air conditioning or fans, or get in a cooler place. ? Avoid caffeine and alcohol. ? Wear loose-fitting, lightweight, and light-colored  clothing.  If it is really cold outside: ? Avoid intense activities. ? Layer your clothing. ? Wear mittens or gloves, a hat, and a scarf when going outside. ? Avoid alcohol.  Learn about heart failure and get support as needed.  Get help to maintain or improve your quality of life and your ability to care for yourself as needed. Contact a doctor if:  You gain weight quickly.  You are more short of breath than usual.  You cannot do your normal activities.  You tire easily.  You cough more than normal, especially with activity.  You have any or more puffiness (swelling) in areas such as your hands, feet, ankles, or  belly (abdomen).  You cannot sleep because it is hard to breathe.  You feel like your heart is beating fast (palpitations).  You get dizzy or light-headed when you stand up. Get help right away if:  You have trouble breathing.  There is a change in mental status, such as becoming less alert or not being able to focus.  You have chest pain or discomfort.  You faint. This information is not intended to replace advice given to you by your health care provider. Make sure you discuss any questions you have with your health care provider. Document Released: 02/08/2008 Document Revised: 10/07/2015 Document Reviewed: 06/17/2012 Elsevier Interactive Patient Education  2017 [employer].
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
## 2017-09-14 ENCOUNTER — Encounter: Payer: Self-pay | Admitting: Physician Assistant

## 2017-09-14 LAB — COMPLETE METABOLIC PANEL WITH GFR
AG Ratio: 1.5 (calc) (ref 1.0–2.5)
ALT: 6 U/L — AB (ref 9–46)
AST: 12 U/L (ref 10–35)
Albumin: 4.1 g/dL (ref 3.6–5.1)
Alkaline phosphatase (APISO): 75 U/L (ref 40–115)
BUN/Creatinine Ratio: 14 (calc) (ref 6–22)
BUN: 20 mg/dL (ref 7–25)
CALCIUM: 9.6 mg/dL (ref 8.6–10.3)
CO2: 27 mmol/L (ref 20–32)
Chloride: 108 mmol/L (ref 98–110)
Creat: 1.44 mg/dL — ABNORMAL HIGH (ref 0.70–1.25)
GFR, EST AFRICAN AMERICAN: 57 mL/min/{1.73_m2} — AB (ref >=60)
GFR, EST NON AFRICAN AMERICAN: 49 mL/min/{1.73_m2} — AB (ref >=60)
GLUCOSE: 124 mg/dL — AB (ref 65–99)
Globulin: 2.7 g/dL (calc) (ref 1.9–3.7)
Potassium: 4.3 mmol/L (ref 3.5–5.3)
Sodium: 142 mmol/L (ref 135–146)
TOTAL PROTEIN: 6.8 g/dL (ref 6.1–8.1)
Total Bilirubin: 1 mg/dL (ref 0.2–1.2)

## 2017-09-14 LAB — LIPID PANEL
Cholesterol: 150 mg/dL (ref <200)
HDL: 48 mg/dL (ref >40)
LDL Cholesterol (Calc): 82 mg/dL (calc)
Non-HDL Cholesterol (Calc): 102 mg/dL (calc) (ref <130)
Total CHOL/HDL Ratio: 3.1 (calc) (ref <5.0)
Triglycerides: 103 mg/dL (ref <150)

## 2017-09-14 LAB — MAGNESIUM: Magnesium: 1.9 mg/dL (ref 1.5–2.5)

## 2017-09-14 MED ORDER — LISINOPRIL 10 MG PO TABS: ORAL_TABLET | ORAL | 1 refills | Status: DC

## 2017-09-14 MED ORDER — CARVEDILOL 25 MG PO TABS: ORAL_TABLET | ORAL | 1 refills | Status: DC

## 2017-09-14 MED ORDER — TAMSULOSIN HCL 0.4 MG PO CAPS: ORAL_CAPSULE | ORAL | 1 refills | Status: DC

## 2017-09-14 MED ORDER — RIVAROXABAN 20 MG PO TABS: ORAL_TABLET | ORAL | 1 refills | Status: DC

## 2017-09-14 MED ORDER — DONEPEZIL HCL 5 MG PO TABS: ORAL_TABLET | ORAL | 1 refills | Status: DC

## 2017-09-14 MED ORDER — PRAVASTATIN SODIUM 20 MG PO TABS: ORAL_TABLET | ORAL | 1 refills | Status: DC

## 2017-09-17 ENCOUNTER — Other Ambulatory Visit: Payer: Self-pay | Admitting: Internal Medicine

## 2017-09-17 DIAGNOSIS — I10 Essential (primary) hypertension: Secondary | ICD-10-CM

## 2017-09-17 MED ORDER — LISINOPRIL 10 MG PO TABS: ORAL_TABLET | ORAL | 1 refills | Status: DC

## 2017-09-18 DIAGNOSIS — I272 Pulmonary hypertension, unspecified: Secondary | ICD-10-CM | POA: Diagnosis not present

## 2017-09-18 DIAGNOSIS — E559 Vitamin D deficiency, unspecified: Secondary | ICD-10-CM | POA: Diagnosis not present

## 2017-09-18 DIAGNOSIS — I110 Hypertensive heart disease with heart failure: Secondary | ICD-10-CM | POA: Diagnosis not present

## 2017-09-18 DIAGNOSIS — F0390 Unspecified dementia without behavioral disturbance: Secondary | ICD-10-CM | POA: Diagnosis not present

## 2017-09-18 DIAGNOSIS — I5042 Chronic combined systolic (congestive) and diastolic (congestive) heart failure: Secondary | ICD-10-CM | POA: Diagnosis not present

## 2017-09-18 DIAGNOSIS — F411 Generalized anxiety disorder: Secondary | ICD-10-CM | POA: Diagnosis not present

## 2017-09-20 DIAGNOSIS — F411 Generalized anxiety disorder: Secondary | ICD-10-CM | POA: Diagnosis not present

## 2017-09-20 DIAGNOSIS — E559 Vitamin D deficiency, unspecified: Secondary | ICD-10-CM | POA: Diagnosis not present

## 2017-09-20 DIAGNOSIS — I272 Pulmonary hypertension, unspecified: Secondary | ICD-10-CM | POA: Diagnosis not present

## 2017-09-20 DIAGNOSIS — I110 Hypertensive heart disease with heart failure: Secondary | ICD-10-CM | POA: Diagnosis not present

## 2017-09-20 DIAGNOSIS — I5042 Chronic combined systolic (congestive) and diastolic (congestive) heart failure: Secondary | ICD-10-CM | POA: Diagnosis not present

## 2017-09-20 DIAGNOSIS — F0390 Unspecified dementia without behavioral disturbance: Secondary | ICD-10-CM | POA: Diagnosis not present

## 2017-09-22 ENCOUNTER — Other Ambulatory Visit: Payer: Self-pay | Admitting: Internal Medicine

## 2017-09-22 DIAGNOSIS — N401 Enlarged prostate with lower urinary tract symptoms: Secondary | ICD-10-CM

## 2017-09-22 DIAGNOSIS — I10 Essential (primary) hypertension: Secondary | ICD-10-CM

## 2017-09-25 ENCOUNTER — Telehealth: Payer: Self-pay | Admitting: Internal Medicine

## 2017-09-25 DIAGNOSIS — F0390 Unspecified dementia without behavioral disturbance: Secondary | ICD-10-CM | POA: Diagnosis not present

## 2017-09-25 DIAGNOSIS — I5042 Chronic combined systolic (congestive) and diastolic (congestive) heart failure: Secondary | ICD-10-CM | POA: Diagnosis not present

## 2017-09-25 DIAGNOSIS — I272 Pulmonary hypertension, unspecified: Secondary | ICD-10-CM | POA: Diagnosis not present

## 2017-09-25 DIAGNOSIS — E559 Vitamin D deficiency, unspecified: Secondary | ICD-10-CM | POA: Diagnosis not present

## 2017-09-25 DIAGNOSIS — I110 Hypertensive heart disease with heart failure: Secondary | ICD-10-CM | POA: Diagnosis not present

## 2017-09-25 DIAGNOSIS — F411 Generalized anxiety disorder: Secondary | ICD-10-CM | POA: Diagnosis not present

## 2017-09-25 NOTE — Telephone Encounter (Signed)
Dennis [name], PT w/ [facility] requested PT.  Per Dr Lucky Cowboy, PT 2times a week for 6weeks is authorized. Left vm

## 2017-09-27 DIAGNOSIS — F411 Generalized anxiety disorder: Secondary | ICD-10-CM | POA: Diagnosis not present

## 2017-09-27 DIAGNOSIS — E559 Vitamin D deficiency, unspecified: Secondary | ICD-10-CM | POA: Diagnosis not present

## 2017-09-27 DIAGNOSIS — I5042 Chronic combined systolic (congestive) and diastolic (congestive) heart failure: Secondary | ICD-10-CM | POA: Diagnosis not present

## 2017-09-27 DIAGNOSIS — F0390 Unspecified dementia without behavioral disturbance: Secondary | ICD-10-CM | POA: Diagnosis not present

## 2017-09-27 DIAGNOSIS — I272 Pulmonary hypertension, unspecified: Secondary | ICD-10-CM | POA: Diagnosis not present

## 2017-09-27 DIAGNOSIS — I110 Hypertensive heart disease with heart failure: Secondary | ICD-10-CM | POA: Diagnosis not present

## 2017-09-28 DIAGNOSIS — E559 Vitamin D deficiency, unspecified: Secondary | ICD-10-CM | POA: Diagnosis not present

## 2017-09-28 DIAGNOSIS — I272 Pulmonary hypertension, unspecified: Secondary | ICD-10-CM | POA: Diagnosis not present

## 2017-09-28 DIAGNOSIS — I110 Hypertensive heart disease with heart failure: Secondary | ICD-10-CM | POA: Diagnosis not present

## 2017-09-28 DIAGNOSIS — F0390 Unspecified dementia without behavioral disturbance: Secondary | ICD-10-CM | POA: Diagnosis not present

## 2017-09-28 DIAGNOSIS — I5042 Chronic combined systolic (congestive) and diastolic (congestive) heart failure: Secondary | ICD-10-CM | POA: Diagnosis not present

## 2017-09-28 DIAGNOSIS — F411 Generalized anxiety disorder: Secondary | ICD-10-CM | POA: Diagnosis not present

## 2017-10-02 DIAGNOSIS — I5042 Chronic combined systolic (congestive) and diastolic (congestive) heart failure: Secondary | ICD-10-CM | POA: Diagnosis not present

## 2017-10-02 DIAGNOSIS — I272 Pulmonary hypertension, unspecified: Secondary | ICD-10-CM | POA: Diagnosis not present

## 2017-10-02 DIAGNOSIS — I110 Hypertensive heart disease with heart failure: Secondary | ICD-10-CM | POA: Diagnosis not present

## 2017-10-02 DIAGNOSIS — F411 Generalized anxiety disorder: Secondary | ICD-10-CM | POA: Diagnosis not present

## 2017-10-02 DIAGNOSIS — E559 Vitamin D deficiency, unspecified: Secondary | ICD-10-CM | POA: Diagnosis not present

## 2017-10-02 DIAGNOSIS — F0390 Unspecified dementia without behavioral disturbance: Secondary | ICD-10-CM | POA: Diagnosis not present

## 2017-10-04 DIAGNOSIS — F0390 Unspecified dementia without behavioral disturbance: Secondary | ICD-10-CM | POA: Diagnosis not present

## 2017-10-04 DIAGNOSIS — I110 Hypertensive heart disease with heart failure: Secondary | ICD-10-CM | POA: Diagnosis not present

## 2017-10-04 DIAGNOSIS — E559 Vitamin D deficiency, unspecified: Secondary | ICD-10-CM | POA: Diagnosis not present

## 2017-10-04 DIAGNOSIS — F411 Generalized anxiety disorder: Secondary | ICD-10-CM | POA: Diagnosis not present

## 2017-10-04 DIAGNOSIS — I5042 Chronic combined systolic (congestive) and diastolic (congestive) heart failure: Secondary | ICD-10-CM | POA: Diagnosis not present

## 2017-10-04 DIAGNOSIS — I272 Pulmonary hypertension, unspecified: Secondary | ICD-10-CM | POA: Diagnosis not present

## 2017-10-09 DIAGNOSIS — I110 Hypertensive heart disease with heart failure: Secondary | ICD-10-CM | POA: Diagnosis not present

## 2017-10-09 DIAGNOSIS — I5042 Chronic combined systolic (congestive) and diastolic (congestive) heart failure: Secondary | ICD-10-CM | POA: Diagnosis not present

## 2017-10-09 DIAGNOSIS — F0390 Unspecified dementia without behavioral disturbance: Secondary | ICD-10-CM | POA: Diagnosis not present

## 2017-10-09 DIAGNOSIS — I272 Pulmonary hypertension, unspecified: Secondary | ICD-10-CM | POA: Diagnosis not present

## 2017-10-09 DIAGNOSIS — E559 Vitamin D deficiency, unspecified: Secondary | ICD-10-CM | POA: Diagnosis not present

## 2017-10-09 DIAGNOSIS — F411 Generalized anxiety disorder: Secondary | ICD-10-CM | POA: Diagnosis not present

## 2017-10-10 DIAGNOSIS — I5042 Chronic combined systolic (congestive) and diastolic (congestive) heart failure: Secondary | ICD-10-CM | POA: Diagnosis not present

## 2017-10-10 DIAGNOSIS — E559 Vitamin D deficiency, unspecified: Secondary | ICD-10-CM | POA: Diagnosis not present

## 2017-10-10 DIAGNOSIS — F411 Generalized anxiety disorder: Secondary | ICD-10-CM | POA: Diagnosis not present

## 2017-10-10 DIAGNOSIS — F0390 Unspecified dementia without behavioral disturbance: Secondary | ICD-10-CM | POA: Diagnosis not present

## 2017-10-10 DIAGNOSIS — I110 Hypertensive heart disease with heart failure: Secondary | ICD-10-CM | POA: Diagnosis not present

## 2017-10-10 DIAGNOSIS — I272 Pulmonary hypertension, unspecified: Secondary | ICD-10-CM | POA: Diagnosis not present

## 2017-10-11 DIAGNOSIS — I5042 Chronic combined systolic (congestive) and diastolic (congestive) heart failure: Secondary | ICD-10-CM | POA: Diagnosis not present

## 2017-10-11 DIAGNOSIS — F0390 Unspecified dementia without behavioral disturbance: Secondary | ICD-10-CM | POA: Diagnosis not present

## 2017-10-11 DIAGNOSIS — E559 Vitamin D deficiency, unspecified: Secondary | ICD-10-CM | POA: Diagnosis not present

## 2017-10-11 DIAGNOSIS — F411 Generalized anxiety disorder: Secondary | ICD-10-CM | POA: Diagnosis not present

## 2017-10-11 DIAGNOSIS — I272 Pulmonary hypertension, unspecified: Secondary | ICD-10-CM | POA: Diagnosis not present

## 2017-10-11 DIAGNOSIS — I110 Hypertensive heart disease with heart failure: Secondary | ICD-10-CM | POA: Diagnosis not present

## 2017-10-16 DIAGNOSIS — I110 Hypertensive heart disease with heart failure: Secondary | ICD-10-CM | POA: Diagnosis not present

## 2017-10-16 DIAGNOSIS — E559 Vitamin D deficiency, unspecified: Secondary | ICD-10-CM | POA: Diagnosis not present

## 2017-10-16 DIAGNOSIS — I272 Pulmonary hypertension, unspecified: Secondary | ICD-10-CM | POA: Diagnosis not present

## 2017-10-16 DIAGNOSIS — F411 Generalized anxiety disorder: Secondary | ICD-10-CM | POA: Diagnosis not present

## 2017-10-16 DIAGNOSIS — I5042 Chronic combined systolic (congestive) and diastolic (congestive) heart failure: Secondary | ICD-10-CM | POA: Diagnosis not present

## 2017-10-16 DIAGNOSIS — F0390 Unspecified dementia without behavioral disturbance: Secondary | ICD-10-CM | POA: Diagnosis not present

## 2017-10-17 DIAGNOSIS — I272 Pulmonary hypertension, unspecified: Secondary | ICD-10-CM | POA: Diagnosis not present

## 2017-10-17 DIAGNOSIS — I5042 Chronic combined systolic (congestive) and diastolic (congestive) heart failure: Secondary | ICD-10-CM | POA: Diagnosis not present

## 2017-10-17 DIAGNOSIS — I110 Hypertensive heart disease with heart failure: Secondary | ICD-10-CM | POA: Diagnosis not present

## 2017-10-17 DIAGNOSIS — E559 Vitamin D deficiency, unspecified: Secondary | ICD-10-CM | POA: Diagnosis not present

## 2017-10-17 DIAGNOSIS — F411 Generalized anxiety disorder: Secondary | ICD-10-CM | POA: Diagnosis not present

## 2017-10-17 DIAGNOSIS — F0390 Unspecified dementia without behavioral disturbance: Secondary | ICD-10-CM | POA: Diagnosis not present

## 2017-10-18 DIAGNOSIS — I5042 Chronic combined systolic (congestive) and diastolic (congestive) heart failure: Secondary | ICD-10-CM | POA: Diagnosis not present

## 2017-10-18 DIAGNOSIS — F0390 Unspecified dementia without behavioral disturbance: Secondary | ICD-10-CM | POA: Diagnosis not present

## 2017-10-18 DIAGNOSIS — I110 Hypertensive heart disease with heart failure: Secondary | ICD-10-CM | POA: Diagnosis not present

## 2017-10-18 DIAGNOSIS — E559 Vitamin D deficiency, unspecified: Secondary | ICD-10-CM | POA: Diagnosis not present

## 2017-10-18 DIAGNOSIS — F411 Generalized anxiety disorder: Secondary | ICD-10-CM | POA: Diagnosis not present

## 2017-10-18 DIAGNOSIS — I272 Pulmonary hypertension, unspecified: Secondary | ICD-10-CM | POA: Diagnosis not present

## 2017-10-21 ENCOUNTER — Encounter (HOSPITAL_COMMUNITY): Payer: Self-pay | Admitting: Emergency Medicine

## 2017-10-21 ENCOUNTER — Emergency Department (HOSPITAL_COMMUNITY)
Admission: EM | Admit: 2017-10-21 | Discharge: 2017-10-21 | Disposition: A | Discharge status: Home / Self Care | Payer: Medicare HMO | Attending: Emergency Medicine | Admitting: Emergency Medicine

## 2017-10-21 DIAGNOSIS — I2510 Atherosclerotic heart disease of native coronary artery without angina pectoris: Secondary | ICD-10-CM | POA: Diagnosis not present

## 2017-10-21 DIAGNOSIS — Z7982 Long term (current) use of aspirin: Secondary | ICD-10-CM | POA: Insufficient documentation

## 2017-10-21 DIAGNOSIS — Z7901 Long term (current) use of anticoagulants: Secondary | ICD-10-CM | POA: Insufficient documentation

## 2017-10-21 DIAGNOSIS — K0889 Other specified disorders of teeth and supporting structures: Secondary | ICD-10-CM | POA: Diagnosis not present

## 2017-10-21 DIAGNOSIS — F0390 Unspecified dementia without behavioral disturbance: Secondary | ICD-10-CM | POA: Diagnosis not present

## 2017-10-21 DIAGNOSIS — I5022 Chronic systolic (congestive) heart failure: Secondary | ICD-10-CM | POA: Diagnosis not present

## 2017-10-21 DIAGNOSIS — I110 Hypertensive heart disease with heart failure: Secondary | ICD-10-CM | POA: Insufficient documentation

## 2017-10-21 DIAGNOSIS — Z96641 Presence of right artificial hip joint: Secondary | ICD-10-CM | POA: Insufficient documentation

## 2017-10-21 DIAGNOSIS — Z79899 Other long term (current) drug therapy: Secondary | ICD-10-CM | POA: Diagnosis not present

## 2017-10-21 MED ORDER — PENICILLIN V POTASSIUM 250 MG PO TABS: 250.0000 mg | ORAL_TABLET | Freq: Four times a day (QID) | ORAL | 0 refills | Status: AC
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
## 2017-10-21 NOTE — ED Triage Notes (Signed)
Pt presents to ED for assessment of left sided dental pain and swelling.  Pt's family member states "it's an abscess".  States it was double as swollen this morning upon waking as it is now.

## 2017-10-22 DIAGNOSIS — F0390 Unspecified dementia without behavioral disturbance: Secondary | ICD-10-CM | POA: Diagnosis not present

## 2017-10-22 DIAGNOSIS — I5042 Chronic combined systolic (congestive) and diastolic (congestive) heart failure: Secondary | ICD-10-CM | POA: Diagnosis not present

## 2017-10-22 DIAGNOSIS — I110 Hypertensive heart disease with heart failure: Secondary | ICD-10-CM | POA: Diagnosis not present

## 2017-10-22 DIAGNOSIS — I272 Pulmonary hypertension, unspecified: Secondary | ICD-10-CM | POA: Diagnosis not present

## 2017-10-22 DIAGNOSIS — E559 Vitamin D deficiency, unspecified: Secondary | ICD-10-CM | POA: Diagnosis not present

## 2017-10-22 DIAGNOSIS — F411 Generalized anxiety disorder: Secondary | ICD-10-CM | POA: Diagnosis not present

## 2017-10-23 DIAGNOSIS — E559 Vitamin D deficiency, unspecified: Secondary | ICD-10-CM | POA: Diagnosis not present

## 2017-10-23 DIAGNOSIS — I110 Hypertensive heart disease with heart failure: Secondary | ICD-10-CM | POA: Diagnosis not present

## 2017-10-23 DIAGNOSIS — I5042 Chronic combined systolic (congestive) and diastolic (congestive) heart failure: Secondary | ICD-10-CM | POA: Diagnosis not present

## 2017-10-23 DIAGNOSIS — F0390 Unspecified dementia without behavioral disturbance: Secondary | ICD-10-CM | POA: Diagnosis not present

## 2017-10-23 DIAGNOSIS — I272 Pulmonary hypertension, unspecified: Secondary | ICD-10-CM | POA: Diagnosis not present

## 2017-10-23 DIAGNOSIS — F411 Generalized anxiety disorder: Secondary | ICD-10-CM | POA: Diagnosis not present

## 2017-10-24 DIAGNOSIS — F0390 Unspecified dementia without behavioral disturbance: Secondary | ICD-10-CM | POA: Diagnosis not present

## 2017-10-24 DIAGNOSIS — I272 Pulmonary hypertension, unspecified: Secondary | ICD-10-CM | POA: Diagnosis not present

## 2017-10-24 DIAGNOSIS — E559 Vitamin D deficiency, unspecified: Secondary | ICD-10-CM | POA: Diagnosis not present

## 2017-10-24 DIAGNOSIS — I5042 Chronic combined systolic (congestive) and diastolic (congestive) heart failure: Secondary | ICD-10-CM | POA: Diagnosis not present

## 2017-10-24 DIAGNOSIS — F411 Generalized anxiety disorder: Secondary | ICD-10-CM | POA: Diagnosis not present

## 2017-10-24 DIAGNOSIS — I110 Hypertensive heart disease with heart failure: Secondary | ICD-10-CM | POA: Diagnosis not present

## 2017-10-25 DIAGNOSIS — I5042 Chronic combined systolic (congestive) and diastolic (congestive) heart failure: Secondary | ICD-10-CM | POA: Diagnosis not present

## 2017-10-25 DIAGNOSIS — F411 Generalized anxiety disorder: Secondary | ICD-10-CM | POA: Diagnosis not present

## 2017-10-25 DIAGNOSIS — I272 Pulmonary hypertension, unspecified: Secondary | ICD-10-CM | POA: Diagnosis not present

## 2017-10-25 DIAGNOSIS — F0390 Unspecified dementia without behavioral disturbance: Secondary | ICD-10-CM | POA: Diagnosis not present

## 2017-10-25 DIAGNOSIS — I110 Hypertensive heart disease with heart failure: Secondary | ICD-10-CM | POA: Diagnosis not present

## 2017-10-25 DIAGNOSIS — E559 Vitamin D deficiency, unspecified: Secondary | ICD-10-CM | POA: Diagnosis not present

## 2017-10-30 DIAGNOSIS — I5042 Chronic combined systolic (congestive) and diastolic (congestive) heart failure: Secondary | ICD-10-CM | POA: Diagnosis not present

## 2017-10-30 DIAGNOSIS — E559 Vitamin D deficiency, unspecified: Secondary | ICD-10-CM | POA: Diagnosis not present

## 2017-10-30 DIAGNOSIS — I272 Pulmonary hypertension, unspecified: Secondary | ICD-10-CM | POA: Diagnosis not present

## 2017-10-30 DIAGNOSIS — I110 Hypertensive heart disease with heart failure: Secondary | ICD-10-CM | POA: Diagnosis not present

## 2017-10-30 DIAGNOSIS — F411 Generalized anxiety disorder: Secondary | ICD-10-CM | POA: Diagnosis not present

## 2017-10-30 DIAGNOSIS — F0390 Unspecified dementia without behavioral disturbance: Secondary | ICD-10-CM | POA: Diagnosis not present

## 2017-11-02 DIAGNOSIS — I272 Pulmonary hypertension, unspecified: Secondary | ICD-10-CM | POA: Diagnosis not present

## 2017-11-02 DIAGNOSIS — I110 Hypertensive heart disease with heart failure: Secondary | ICD-10-CM | POA: Diagnosis not present

## 2017-11-02 DIAGNOSIS — F0390 Unspecified dementia without behavioral disturbance: Secondary | ICD-10-CM | POA: Diagnosis not present

## 2017-11-02 DIAGNOSIS — I5042 Chronic combined systolic (congestive) and diastolic (congestive) heart failure: Secondary | ICD-10-CM | POA: Diagnosis not present

## 2017-11-02 DIAGNOSIS — F411 Generalized anxiety disorder: Secondary | ICD-10-CM | POA: Diagnosis not present

## 2017-11-02 DIAGNOSIS — E559 Vitamin D deficiency, unspecified: Secondary | ICD-10-CM | POA: Diagnosis not present

## 2017-11-06 DIAGNOSIS — F0390 Unspecified dementia without behavioral disturbance: Secondary | ICD-10-CM | POA: Diagnosis not present

## 2017-11-06 DIAGNOSIS — I110 Hypertensive heart disease with heart failure: Secondary | ICD-10-CM | POA: Diagnosis not present

## 2017-11-06 DIAGNOSIS — I272 Pulmonary hypertension, unspecified: Secondary | ICD-10-CM | POA: Diagnosis not present

## 2017-11-06 DIAGNOSIS — F411 Generalized anxiety disorder: Secondary | ICD-10-CM | POA: Diagnosis not present

## 2017-11-06 DIAGNOSIS — E559 Vitamin D deficiency, unspecified: Secondary | ICD-10-CM | POA: Diagnosis not present

## 2017-11-06 DIAGNOSIS — I5042 Chronic combined systolic (congestive) and diastolic (congestive) heart failure: Secondary | ICD-10-CM | POA: Diagnosis not present

## 2017-11-12 DIAGNOSIS — E559 Vitamin D deficiency, unspecified: Secondary | ICD-10-CM | POA: Diagnosis not present

## 2017-11-12 DIAGNOSIS — I272 Pulmonary hypertension, unspecified: Secondary | ICD-10-CM | POA: Diagnosis not present

## 2017-11-12 DIAGNOSIS — I5042 Chronic combined systolic (congestive) and diastolic (congestive) heart failure: Secondary | ICD-10-CM | POA: Diagnosis not present

## 2017-11-12 DIAGNOSIS — F0390 Unspecified dementia without behavioral disturbance: Secondary | ICD-10-CM | POA: Diagnosis not present

## 2017-11-12 DIAGNOSIS — F411 Generalized anxiety disorder: Secondary | ICD-10-CM | POA: Diagnosis not present

## 2017-11-12 DIAGNOSIS — I110 Hypertensive heart disease with heart failure: Secondary | ICD-10-CM | POA: Diagnosis not present

## 2017-11-28 ENCOUNTER — Encounter: Payer: Self-pay | Admitting: Internal Medicine

## 2017-11-29 ENCOUNTER — Encounter: Payer: Self-pay | Admitting: Internal Medicine

## 2018-01-02 ENCOUNTER — Other Ambulatory Visit: Payer: Self-pay | Admitting: Internal Medicine

## 2018-01-02 ENCOUNTER — Other Ambulatory Visit: Payer: Self-pay | Admitting: *Deleted

## 2018-01-02 DIAGNOSIS — D6859 Other primary thrombophilia: Secondary | ICD-10-CM

## 2018-01-02 DIAGNOSIS — F0390 Unspecified dementia without behavioral disturbance: Secondary | ICD-10-CM

## 2018-01-02 DIAGNOSIS — E782 Mixed hyperlipidemia: Secondary | ICD-10-CM

## 2018-01-02 DIAGNOSIS — F03A0 Unspecified dementia, mild, without behavioral disturbance, psychotic disturbance, mood disturbance, and anxiety: Secondary | ICD-10-CM

## 2018-01-02 DIAGNOSIS — I10 Essential (primary) hypertension: Secondary | ICD-10-CM

## 2018-01-02 MED ORDER — RIVAROXABAN 20 MG PO TABS: ORAL_TABLET | ORAL | 0 refills | Status: DC

## 2018-01-02 MED ORDER — DONEPEZIL HCL 5 MG PO TABS: ORAL_TABLET | ORAL | 0 refills | Status: DC

## 2018-01-02 MED ORDER — LISINOPRIL 10 MG PO TABS
ORAL_TABLET | ORAL | 0 refills | Status: DC
Start: 2018-01-02 — End: 2018-03-12

## 2018-01-02 MED ORDER — PRAVASTATIN SODIUM 20 MG PO TABS: ORAL_TABLET | ORAL | 0 refills | Status: DC

## 2018-01-03 ENCOUNTER — Telehealth: Payer: Self-pay | Admitting: *Deleted

## 2018-01-03 NOTE — Telephone Encounter (Signed)
The patient was informed that he will not receive refills on Sertraline and Bupropion, due to having been discontinued t his 09/13/2017 visit with Quentin MullingAmanda Collier.

## 2018-01-15 ENCOUNTER — Ambulatory Visit (INDEPENDENT_AMBULATORY_CARE_PROVIDER_SITE_OTHER): Payer: Medicare HMO | Admitting: Internal Medicine

## 2018-01-15 ENCOUNTER — Encounter: Payer: Self-pay | Admitting: Internal Medicine

## 2018-01-15 VITALS — BP 118/60 | HR 56 | Temp 97.9°F | Resp 16 | Ht 66.5 in | Wt 141.4 lb

## 2018-01-15 DIAGNOSIS — F32A Depression, unspecified: Secondary | ICD-10-CM

## 2018-01-15 DIAGNOSIS — Z125 Encounter for screening for malignant neoplasm of prostate: Secondary | ICD-10-CM | POA: Diagnosis not present

## 2018-01-15 DIAGNOSIS — Z0001 Encounter for general adult medical examination with abnormal findings: Secondary | ICD-10-CM

## 2018-01-15 DIAGNOSIS — Z79899 Other long term (current) drug therapy: Secondary | ICD-10-CM | POA: Diagnosis not present

## 2018-01-15 DIAGNOSIS — R7309 Other abnormal glucose: Secondary | ICD-10-CM | POA: Diagnosis not present

## 2018-01-15 DIAGNOSIS — Z1212 Encounter for screening for malignant neoplasm of rectum: Secondary | ICD-10-CM

## 2018-01-15 DIAGNOSIS — R7303 Prediabetes: Secondary | ICD-10-CM | POA: Diagnosis not present

## 2018-01-15 DIAGNOSIS — Z136 Encounter for screening for cardiovascular disorders: Secondary | ICD-10-CM

## 2018-01-15 DIAGNOSIS — I10 Essential (primary) hypertension: Secondary | ICD-10-CM

## 2018-01-15 DIAGNOSIS — F329 Major depressive disorder, single episode, unspecified: Secondary | ICD-10-CM

## 2018-01-15 DIAGNOSIS — N401 Enlarged prostate with lower urinary tract symptoms: Secondary | ICD-10-CM | POA: Diagnosis not present

## 2018-01-15 DIAGNOSIS — Z0000 Encounter for general adult medical examination without abnormal findings: Secondary | ICD-10-CM

## 2018-01-15 DIAGNOSIS — E538 Deficiency of other specified B group vitamins: Secondary | ICD-10-CM | POA: Diagnosis not present

## 2018-01-15 DIAGNOSIS — N138 Other obstructive and reflux uropathy: Secondary | ICD-10-CM

## 2018-01-15 DIAGNOSIS — I2510 Atherosclerotic heart disease of native coronary artery without angina pectoris: Secondary | ICD-10-CM

## 2018-01-15 DIAGNOSIS — Z1211 Encounter for screening for malignant neoplasm of colon: Secondary | ICD-10-CM

## 2018-01-15 DIAGNOSIS — Z8249 Family history of ischemic heart disease and other diseases of the circulatory system: Secondary | ICD-10-CM

## 2018-01-15 DIAGNOSIS — E782 Mixed hyperlipidemia: Secondary | ICD-10-CM | POA: Diagnosis not present

## 2018-01-15 DIAGNOSIS — M1000 Idiopathic gout, unspecified site: Secondary | ICD-10-CM

## 2018-01-15 DIAGNOSIS — K219 Gastro-esophageal reflux disease without esophagitis: Secondary | ICD-10-CM

## 2018-01-15 DIAGNOSIS — E559 Vitamin D deficiency, unspecified: Secondary | ICD-10-CM | POA: Diagnosis not present

## 2018-01-15 NOTE — Patient Instructions (Signed)
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
## 2018-01-16 LAB — CBC WITH DIFFERENTIAL/PLATELET
Basophils Absolute: 28 cells/uL (ref 0–200)
Basophils Relative: 0.3 %
Eosinophils Absolute: 331 cells/uL (ref 15–500)
Eosinophils Relative: 3.6 %
HCT: 39.6 % (ref 38.5–50.0)
Hemoglobin: 13.2 g/dL (ref 13.2–17.1)
LYMPHS ABS: 2282 {cells}/uL (ref 850–3900)
MCH: 30.9 pg (ref 27.0–33.0)
MCHC: 33.3 g/dL (ref 32.0–36.0)
MCV: 92.7 fL (ref 80.0–100.0)
MONOS PCT: 8.6 %
MPV: 10.5 fL (ref 7.5–12.5)
Neutro Abs: 5768 cells/uL (ref 1500–7800)
Neutrophils Relative %: 62.7 %
PLATELETS: 169 10*3/uL (ref 140–400)
RBC: 4.27 10*6/uL (ref 4.20–5.80)
RDW: 13.4 % (ref 11.0–15.0)
Total Lymphocyte: 24.8 %
WBC mixed population: 791 cells/uL (ref 200–950)
WBC: 9.2 10*3/uL (ref 3.8–10.8)

## 2018-01-16 LAB — VITAMIN D 25 HYDROXY (VIT D DEFICIENCY, FRACTURES): VIT D 25 HYDROXY: 45 ng/mL (ref 30–100)

## 2018-01-16 LAB — URINALYSIS, ROUTINE W REFLEX MICROSCOPIC
BILIRUBIN URINE: NEGATIVE
Glucose, UA: NEGATIVE
Hgb urine dipstick: NEGATIVE
KETONES UR: NEGATIVE
Leukocytes, UA: NEGATIVE
Nitrite: NEGATIVE
Protein, ur: NEGATIVE
SPECIFIC GRAVITY, URINE: 1.014 (ref 1.001–1.03)

## 2018-01-16 LAB — HEMOGLOBIN A1C
EAG (MMOL/L): 6.5 (calc)
HEMOGLOBIN A1C: 5.7 %{Hb} — AB (ref <5.7)
Mean Plasma Glucose: 117 (calc)

## 2018-01-16 LAB — TSH: TSH: 0.85 m[IU]/L (ref 0.40–4.50)

## 2018-01-16 LAB — LIPID PANEL
Cholesterol: 124 mg/dL (ref <200)
HDL: 46 mg/dL (ref >40)
LDL Cholesterol (Calc): 64 mg/dL (calc)
Non-HDL Cholesterol (Calc): 78 mg/dL (calc) (ref <130)
TRIGLYCERIDES: 64 mg/dL (ref <150)
Total CHOL/HDL Ratio: 2.7 (calc) (ref <5.0)

## 2018-01-16 LAB — COMPLETE METABOLIC PANEL WITH GFR
AG Ratio: 1.5 (calc) (ref 1.0–2.5)
ALKALINE PHOSPHATASE (APISO): 73 U/L (ref 40–115)
ALT: 7 U/L — AB (ref 9–46)
AST: 12 U/L (ref 10–35)
Albumin: 4 g/dL (ref 3.6–5.1)
BILIRUBIN TOTAL: 0.6 mg/dL (ref 0.2–1.2)
BUN / CREAT RATIO: 17 (calc) (ref 6–22)
BUN: 29 mg/dL — AB (ref 7–25)
CHLORIDE: 104 mmol/L (ref 98–110)
CO2: 32 mmol/L (ref 20–32)
Calcium: 9.6 mg/dL (ref 8.6–10.3)
Creat: 1.7 mg/dL — ABNORMAL HIGH (ref 0.70–1.25)
GFR, Est African American: 47 mL/min/{1.73_m2} — ABNORMAL LOW (ref >=60)
GFR, Est Non African American: 40 mL/min/{1.73_m2} — ABNORMAL LOW (ref >=60)
GLUCOSE: 63 mg/dL — AB (ref 65–99)
Globulin: 2.7 g/dL (calc) (ref 1.9–3.7)
POTASSIUM: 4.4 mmol/L (ref 3.5–5.3)
Sodium: 141 mmol/L (ref 135–146)
Total Protein: 6.7 g/dL (ref 6.1–8.1)

## 2018-01-16 LAB — VITAMIN B12: VITAMIN B 12: 681 pg/mL (ref 200–1100)

## 2018-01-16 LAB — MICROALBUMIN / CREATININE URINE RATIO
Creatinine, Urine: 85 mg/dL (ref 20–320)
MICROALB UR: 0.2 mg/dL
Microalb Creat Ratio: 2 mcg/mg creat (ref <30)

## 2018-01-16 LAB — INSULIN, RANDOM: INSULIN: 5.9 u[IU]/mL (ref 2.0–19.6)

## 2018-01-16 LAB — PSA: PSA: 1.1 ng/mL (ref <=4.0)

## 2018-01-16 LAB — URIC ACID: URIC ACID, SERUM: 7.2 mg/dL (ref 4.0–8.0)

## 2018-01-16 LAB — MAGNESIUM: MAGNESIUM: 2 mg/dL (ref 1.5–2.5)

## 2018-01-22 ENCOUNTER — Ambulatory Visit (INDEPENDENT_AMBULATORY_CARE_PROVIDER_SITE_OTHER): Payer: Medicare HMO

## 2018-01-22 DIAGNOSIS — H6120 Impacted cerumen, unspecified ear: Secondary | ICD-10-CM

## 2018-01-22 NOTE — Progress Notes (Signed)
Pt reports for wax removal in both ears there was a lot of wax in both ears which all the wax in the left ear was removed but the right ear still had a lot of wax in it. The pt was happy w/ what was removed.

## 2018-03-12 ENCOUNTER — Other Ambulatory Visit: Payer: Self-pay | Admitting: Physician Assistant

## 2018-03-12 ENCOUNTER — Other Ambulatory Visit: Payer: Self-pay | Admitting: Internal Medicine

## 2018-03-12 DIAGNOSIS — I10 Essential (primary) hypertension: Secondary | ICD-10-CM

## 2018-03-12 DIAGNOSIS — E782 Mixed hyperlipidemia: Secondary | ICD-10-CM
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
## 2018-04-22 ENCOUNTER — Encounter: Payer: Self-pay | Admitting: Adult Health

## 2018-04-22 ENCOUNTER — Ambulatory Visit (INDEPENDENT_AMBULATORY_CARE_PROVIDER_SITE_OTHER): Payer: Medicare HMO | Admitting: Adult Health

## 2018-04-22 VITALS — BP 124/74 | HR 63 | Temp 97.9°F | Ht 66.5 in | Wt 141.0 lb

## 2018-04-22 DIAGNOSIS — R2689 Other abnormalities of gait and mobility: Secondary | ICD-10-CM

## 2018-04-22 DIAGNOSIS — E782 Mixed hyperlipidemia: Secondary | ICD-10-CM

## 2018-04-22 DIAGNOSIS — Z86711 Personal history of pulmonary embolism: Secondary | ICD-10-CM

## 2018-04-22 DIAGNOSIS — Z86718 Personal history of other venous thrombosis and embolism: Secondary | ICD-10-CM | POA: Diagnosis not present

## 2018-04-22 DIAGNOSIS — I110 Hypertensive heart disease with heart failure: Secondary | ICD-10-CM | POA: Diagnosis not present

## 2018-04-22 DIAGNOSIS — E559 Vitamin D deficiency, unspecified: Secondary | ICD-10-CM | POA: Diagnosis not present

## 2018-04-22 DIAGNOSIS — F0390 Unspecified dementia without behavioral disturbance: Secondary | ICD-10-CM | POA: Diagnosis not present

## 2018-04-22 DIAGNOSIS — R7303 Prediabetes: Secondary | ICD-10-CM

## 2018-04-22 DIAGNOSIS — Z6822 Body mass index (BMI) 22.0-22.9, adult: Secondary | ICD-10-CM

## 2018-04-22 DIAGNOSIS — I5022 Chronic systolic (congestive) heart failure: Secondary | ICD-10-CM | POA: Diagnosis not present

## 2018-04-22 DIAGNOSIS — Z79899 Other long term (current) drug therapy: Secondary | ICD-10-CM

## 2018-04-22 DIAGNOSIS — F03A0 Unspecified dementia, mild, without behavioral disturbance, psychotic disturbance, mood disturbance, and anxiety: Secondary | ICD-10-CM

## 2018-04-22 DIAGNOSIS — M2170 Unequal limb length (acquired), unspecified site: Secondary | ICD-10-CM

## 2018-04-22 NOTE — Patient Instructions (Addendum)
Goals    . Exercise 150 min/wk Moderate Activity per week       Know what a healthy weight is for you (roughly BMI <25) and aim to maintain this  Aim for 7+ servings of fruits and vegetables daily  65-80+ fluid ounces of water or unsweet tea for healthy kidneys  Limit to max 1 drink of alcohol per day; avoid smoking/tobacco  Limit animal fats in diet for cholesterol and heart health - choose grass fed whenever available  Avoid highly processed foods, and foods high in saturated/trans fats  Aim for low stress - take time to unwind and care for your mental health  Aim for 150 min of moderate intensity exercise weekly for heart health, and weights twice weekly for bone health  Aim for 7-9 hours of sleep daily    Do the following things EVERYDAY: 1) Weigh yourself in the morning before breakfast or at the same time every day. Write it down and keep it in a log. 2) Take your medicines as prescribed 3) Eat low salt foods-Limit salt (sodium) to 2000 mg per day. Best thing to do is avoid processed foods.   4) Stay as active as you can everyday 5) Limit all fluids for the day to less than 1.5 liters  Call your doctor if:  Anytime you have any of the following symptoms:  1) 2 pound weight gain in 24 hours or 5 pounds in 1 week  2) shortness of breath, with or without a dry hacking cough  3) swelling in the hands, LEGs, feet or stomach  4) if you have to sleep on extra pillows at night in order to breathe. 5) after laying down at night for 20-30 mins, you wake up short of breath.   These can all be signs of fluid overload.    Heart Failure Heart failure means your heart has trouble pumping blood. This makes it hard for your body to work well. Heart failure is usually a long-term (chronic) condition. You must take good care of yourself and follow your doctor's treatment plan. Follow these instructions at home:  Take your heart medicine as told by your doctor. ? Do not stop  taking medicine unless your doctor tells you to. ? Do not skip any dose of medicine. ? Refill your medicines before they run out. ? Take other medicines only as told by your doctor or pharmacist.  Stay active if told by your doctor. The elderly and people with severe heart failure should talk with a doctor about physical activity.  Eat heart-healthy foods. Choose foods that are without trans fat and are low in saturated fat, cholesterol, and salt (sodium). This includes fresh or frozen fruits and vegetables, fish, lean meats, fat-free or low-fat dairy foods, whole grains, and high-fiber foods. Lentils and dried peas and beans (legumes) are also good choices.  Limit salt if told by your doctor.  Cook in a healthy way. Roast, grill, broil, bake, poach, steam, or stir-fry foods.  Limit fluids as told by your doctor.  Weigh yourself every morning. Do this after you pee (urinate) and before you eat breakfast. Write down your weight to give to your doctor.  Take your blood pressure and write it down if your doctor tells you to.  Ask your doctor how to check your pulse. Check your pulse as told.  Lose weight if told by your doctor.  Stop smoking or chewing tobacco. Do not use gum or patches that help you quit without  your doctor's approval.  Schedule and go to doctor visits as told.  Nonpregnant women should have no more than 1 drink a day. Men should have no more than 2 drinks a day. Talk to your doctor about drinking alcohol.  Stop illegal drug use.  Stay current with shots (immunizations).  Manage your health conditions as told by your doctor.  Learn to manage your stress.  Rest when you are tired.  If it is really hot outside: ? Avoid intense activities. ? Use air conditioning or fans, or get in a cooler place. ? Avoid caffeine and alcohol. ? Wear loose-fitting, lightweight, and light-colored clothing.  If it is really cold outside: ? Avoid intense activities. ? Layer your  clothing. ? Wear mittens or gloves, a hat, and a scarf when going outside. ? Avoid alcohol.  Learn about heart failure and get support as needed.  Get help to maintain or improve your quality of life and your ability to care for yourself as needed. Contact a doctor if:  You gain weight quickly.  You are more short of breath than usual.  You cannot do your normal activities.  You tire easily.  You cough more than normal, especially with activity.  You have any or more puffiness (swelling) in areas such as your hands, feet, ankles, or belly (abdomen).  You cannot sleep because it is hard to breathe.  You feel like your heart is beating fast (palpitations).  You get dizzy or light-headed when you stand up. Get help right away if:  You have trouble breathing.  There is a change in mental status, such as becoming less alert or not being able to focus.  You have chest pain or discomfort.  You faint. This information is not intended to replace advice given to you by your health care provider. Make sure you discuss any questions you have with your health care provider. Document Released: 02/08/2008 Document Revised: 10/07/2015 Document Reviewed: 06/17/2012 Elsevier Interactive Patient Education  2017 [employer].

## 2018-04-23 ENCOUNTER — Other Ambulatory Visit: Payer: Self-pay | Admitting: Adult Health

## 2018-04-23 LAB — CBC WITH DIFFERENTIAL/PLATELET
BASOS ABS: 28 {cells}/uL (ref 0–200)
Basophils Relative: 0.3 %
EOS ABS: 230 {cells}/uL (ref 15–500)
EOS PCT: 2.5 %
HEMATOCRIT: 42.3 % (ref 38.5–50.0)
HEMOGLOBIN: 14.3 g/dL (ref 13.2–17.1)
LYMPHS ABS: 1941 {cells}/uL (ref 850–3900)
MCH: 31.4 pg (ref 27.0–33.0)
MCHC: 33.8 g/dL (ref 32.0–36.0)
MCV: 92.8 fL (ref 80.0–100.0)
MPV: 10.6 fL (ref 7.5–12.5)
Monocytes Relative: 9.4 %
NEUTROS ABS: 6136 {cells}/uL (ref 1500–7800)
Neutrophils Relative %: 66.7 %
Platelets: 190 10*3/uL (ref 140–400)
RBC: 4.56 10*6/uL (ref 4.20–5.80)
RDW: 13 % (ref 11.0–15.0)
Total Lymphocyte: 21.1 %
WBC mixed population: 865 cells/uL (ref 200–950)
WBC: 9.2 10*3/uL (ref 3.8–10.8)

## 2018-04-23 LAB — COMPLETE METABOLIC PANEL WITH GFR
AG RATIO: 1.4 (calc) (ref 1.0–2.5)
ALBUMIN MSPROF: 4.2 g/dL (ref 3.6–5.1)
ALT: 8 U/L — ABNORMAL LOW (ref 9–46)
AST: 13 U/L (ref 10–35)
Alkaline phosphatase (APISO): 80 U/L (ref 40–115)
BILIRUBIN TOTAL: 0.5 mg/dL (ref 0.2–1.2)
BUN / CREAT RATIO: 21 (calc) (ref 6–22)
BUN: 29 mg/dL — ABNORMAL HIGH (ref 7–25)
CO2: 31 mmol/L (ref 20–32)
Calcium: 9.9 mg/dL (ref 8.6–10.3)
Chloride: 103 mmol/L (ref 98–110)
Creat: 1.4 mg/dL — ABNORMAL HIGH (ref 0.70–1.18)
GFR, EST AFRICAN AMERICAN: 59 mL/min/{1.73_m2} — AB (ref >=60)
GFR, Est Non African American: 51 mL/min/{1.73_m2} — ABNORMAL LOW (ref >=60)
GLUCOSE: 65 mg/dL (ref 65–99)
Globulin: 2.9 g/dL (calc) (ref 1.9–3.7)
POTASSIUM: 4.5 mmol/L (ref 3.5–5.3)
Sodium: 141 mmol/L (ref 135–146)
TOTAL PROTEIN: 7.1 g/dL (ref 6.1–8.1)

## 2018-04-23 LAB — TSH: TSH: 1.48 m[IU]/L (ref 0.40–4.50)

## 2018-04-23 LAB — HEMOGLOBIN A1C
HEMOGLOBIN A1C: 5.8 %{Hb} — AB (ref <5.7)
MEAN PLASMA GLUCOSE: 120 (calc)
eAG (mmol/L): 6.6 (calc)

## 2018-04-23 LAB — LIPID PANEL
Cholesterol: 120 mg/dL (ref <200)
HDL: 51 mg/dL (ref >40)
LDL Cholesterol (Calc): 51 mg/dL (calc)
NON-HDL CHOLESTEROL (CALC): 69 mg/dL (ref <130)
TRIGLYCERIDES: 97 mg/dL (ref <150)
Total CHOL/HDL Ratio: 2.4 (calc) (ref <5.0)

## 2018-04-23 LAB — MAGNESIUM: Magnesium: 2.1 mg/dL (ref 1.5–2.5)

## 2018-04-23 LAB — VITAMIN D 25 HYDROXY (VIT D DEFICIENCY, FRACTURES): Vit D, 25-Hydroxy: 55 ng/mL (ref 30–100)

## 2018-04-23 MED ORDER — VITAMIN D 50 MCG (2000 UT) PO TABS: 6000.0000 [IU] | ORAL_TABLET | Freq: Every day | ORAL | Status: DC

## 2018-06-20 ENCOUNTER — Other Ambulatory Visit: Payer: Self-pay | Admitting: Internal Medicine

## 2018-06-27 ENCOUNTER — Inpatient Hospital Stay (HOSPITAL_COMMUNITY)
Admission: EM | Admit: 2018-06-27 | Discharge: 2018-06-30 | DRG: 915 | Disposition: A | Discharge status: Home Health | Payer: Medicare HMO | Attending: Internal Medicine | Admitting: Internal Medicine

## 2018-06-27 ENCOUNTER — Inpatient Hospital Stay (HOSPITAL_COMMUNITY): Payer: Medicare HMO

## 2018-06-27 ENCOUNTER — Emergency Department (HOSPITAL_COMMUNITY): Payer: Medicare HMO

## 2018-06-27 DIAGNOSIS — T445X5A Adverse effect of predominantly beta-adrenoreceptor agonists, initial encounter: Secondary | ICD-10-CM | POA: Diagnosis present

## 2018-06-27 DIAGNOSIS — Z4682 Encounter for fitting and adjustment of non-vascular catheter: Secondary | ICD-10-CM | POA: Diagnosis not present

## 2018-06-27 DIAGNOSIS — I429 Cardiomyopathy, unspecified: Secondary | ICD-10-CM | POA: Diagnosis present

## 2018-06-27 DIAGNOSIS — R220 Localized swelling, mass and lump, head: Secondary | ICD-10-CM | POA: Diagnosis present

## 2018-06-27 DIAGNOSIS — R221 Localized swelling, mass and lump, neck: Secondary | ICD-10-CM | POA: Diagnosis not present

## 2018-06-27 DIAGNOSIS — F0390 Unspecified dementia without behavioral disturbance: Secondary | ICD-10-CM | POA: Diagnosis present

## 2018-06-27 DIAGNOSIS — I493 Ventricular premature depolarization: Secondary | ICD-10-CM | POA: Diagnosis present

## 2018-06-27 DIAGNOSIS — Z86711 Personal history of pulmonary embolism: Secondary | ICD-10-CM

## 2018-06-27 DIAGNOSIS — Z833 Family history of diabetes mellitus: Secondary | ICD-10-CM

## 2018-06-27 DIAGNOSIS — I5042 Chronic combined systolic (congestive) and diastolic (congestive) heart failure: Secondary | ICD-10-CM | POA: Diagnosis present

## 2018-06-27 DIAGNOSIS — Y92230 Patient room in hospital as the place of occurrence of the external cause: Secondary | ICD-10-CM | POA: Diagnosis present

## 2018-06-27 DIAGNOSIS — I952 Hypotension due to drugs: Secondary | ICD-10-CM | POA: Diagnosis not present

## 2018-06-27 DIAGNOSIS — I2510 Atherosclerotic heart disease of native coronary artery without angina pectoris: Secondary | ICD-10-CM | POA: Diagnosis present

## 2018-06-27 DIAGNOSIS — T783XXA Angioneurotic edema, initial encounter: Principal | ICD-10-CM | POA: Diagnosis present

## 2018-06-27 DIAGNOSIS — R918 Other nonspecific abnormal finding of lung field: Secondary | ICD-10-CM | POA: Diagnosis not present

## 2018-06-27 DIAGNOSIS — Z8249 Family history of ischemic heart disease and other diseases of the circulatory system: Secondary | ICD-10-CM | POA: Diagnosis not present

## 2018-06-27 DIAGNOSIS — Z823 Family history of stroke: Secondary | ICD-10-CM

## 2018-06-27 DIAGNOSIS — J9600 Acute respiratory failure, unspecified whether with hypoxia or hypercapnia: Secondary | ICD-10-CM | POA: Diagnosis present

## 2018-06-27 DIAGNOSIS — N400 Enlarged prostate without lower urinary tract symptoms: Secondary | ICD-10-CM | POA: Diagnosis present

## 2018-06-27 DIAGNOSIS — Z7982 Long term (current) use of aspirin: Secondary | ICD-10-CM

## 2018-06-27 DIAGNOSIS — E782 Mixed hyperlipidemia: Secondary | ICD-10-CM | POA: Diagnosis present

## 2018-06-27 DIAGNOSIS — R9431 Abnormal electrocardiogram [ECG] [EKG]: Secondary | ICD-10-CM | POA: Diagnosis not present

## 2018-06-27 DIAGNOSIS — Z7901 Long term (current) use of anticoagulants: Secondary | ICD-10-CM | POA: Diagnosis not present

## 2018-06-27 DIAGNOSIS — Z8673 Personal history of transient ischemic attack (TIA), and cerebral infarction without residual deficits: Secondary | ICD-10-CM

## 2018-06-27 DIAGNOSIS — Z79899 Other long term (current) drug therapy: Secondary | ICD-10-CM | POA: Diagnosis not present

## 2018-06-27 DIAGNOSIS — Z978 Presence of other specified devices: Secondary | ICD-10-CM | POA: Diagnosis not present

## 2018-06-27 DIAGNOSIS — T783XXD Angioneurotic edema, subsequent encounter: Secondary | ICD-10-CM | POA: Diagnosis not present

## 2018-06-27 DIAGNOSIS — I110 Hypertensive heart disease with heart failure: Secondary | ICD-10-CM | POA: Diagnosis present

## 2018-06-27 DIAGNOSIS — R001 Bradycardia, unspecified: Secondary | ICD-10-CM | POA: Diagnosis not present

## 2018-06-27 DIAGNOSIS — T426X5A Adverse effect of other antiepileptic and sedative-hypnotic drugs, initial encounter: Secondary | ICD-10-CM | POA: Diagnosis not present

## 2018-06-27 DIAGNOSIS — J9690 Respiratory failure, unspecified, unspecified whether with hypoxia or hypercapnia: Secondary | ICD-10-CM | POA: Diagnosis not present

## 2018-06-27 DIAGNOSIS — N179 Acute kidney failure, unspecified: Secondary | ICD-10-CM | POA: Diagnosis not present

## 2018-06-27 LAB — MRSA PCR SCREENING: MRSA by PCR: NEGATIVE

## 2018-06-27 LAB — COMPREHENSIVE METABOLIC PANEL
ALT: 12 U/L (ref 0–44)
AST: 19 U/L (ref 15–41)
Albumin: 3.7 g/dL (ref 3.5–5.0)
Alkaline Phosphatase: 60 U/L (ref 38–126)
Anion gap: 13 (ref 5–15)
BUN: 35 mg/dL — ABNORMAL HIGH (ref 8–23)
CO2: 25 mmol/L (ref 22–32)
Calcium: 9.9 mg/dL (ref 8.9–10.3)
Chloride: 104 mmol/L (ref 98–111)
Creatinine, Ser: 1.72 mg/dL — ABNORMAL HIGH (ref 0.61–1.24)
GFR calc Af Amer: 46 mL/min — ABNORMAL LOW (ref >60)
GFR calc non Af Amer: 39 mL/min — ABNORMAL LOW (ref >60)
Glucose, Bld: 117 mg/dL — ABNORMAL HIGH (ref 70–99)
Potassium: 4.4 mmol/L (ref 3.5–5.1)
Sodium: 142 mmol/L (ref 135–145)
Total Bilirubin: 0.7 mg/dL (ref 0.3–1.2)
Total Protein: 6.9 g/dL (ref 6.5–8.1)

## 2018-06-27 LAB — POCT I-STAT 7, (LYTES, BLD GAS, ICA,H+H)
Acid-base deficit: 2 mmol/L (ref 0.0–2.0)
Bicarbonate: 24 mmol/L (ref 20.0–28.0)
Calcium, Ion: 1.28 mmol/L (ref 1.15–1.40)
HCT: 35 % — ABNORMAL LOW (ref 39.0–52.0)
Hemoglobin: 11.9 g/dL — ABNORMAL LOW (ref 13.0–17.0)
O2 Saturation: 99 %
PCO2 ART: 45.2 mmHg (ref 32.0–48.0)
POTASSIUM: 3.8 mmol/L (ref 3.5–5.1)
Sodium: 140 mmol/L (ref 135–145)
TCO2: 25 mmol/L (ref 22–32)
pH, Arterial: 7.333 — ABNORMAL LOW (ref 7.350–7.450)
pO2, Arterial: 167 mmHg — ABNORMAL HIGH (ref 83.0–108.0)

## 2018-06-27 LAB — HIV ANTIBODY (ROUTINE TESTING W REFLEX): HIV Screen 4th Generation wRfx: NONREACTIVE

## 2018-06-27 LAB — HEPARIN LEVEL (UNFRACTIONATED): Heparin Unfractionated: 0.31 IU/mL (ref 0.30–0.70)

## 2018-06-27 LAB — CBC
HCT: 41.3 % (ref 39.0–52.0)
HEMOGLOBIN: 13.5 g/dL (ref 13.0–17.0)
MCH: 31 pg (ref 26.0–34.0)
MCHC: 32.7 g/dL (ref 30.0–36.0)
MCV: 94.7 fL (ref 80.0–100.0)
Platelets: 161 10*3/uL (ref 150–400)
RBC: 4.36 MIL/uL (ref 4.22–5.81)
RDW: 12.8 % (ref 11.5–15.5)
WBC: 10 10*3/uL (ref 4.0–10.5)
nRBC: 0 % (ref 0.0–0.2)

## 2018-06-27 LAB — TRIGLYCERIDES: Triglycerides: 146 mg/dL (ref <150)

## 2018-06-27 LAB — GLUCOSE, CAPILLARY
Glucose-Capillary: 129 mg/dL — ABNORMAL HIGH (ref 70–99)
Glucose-Capillary: 129 mg/dL — ABNORMAL HIGH (ref 70–99)
Glucose-Capillary: 128 mg/dL — ABNORMAL HIGH (ref 70–99)

## 2018-06-27 LAB — APTT: aPTT: 68 seconds — ABNORMAL HIGH (ref 24–36)

## 2018-06-27 MED ORDER — KETAMINE HCL 50 MG/5ML IJ SOSY
PREFILLED_SYRINGE | INTRAMUSCULAR | Status: AC
  Administered 2018-06-27: 100 mg
  Filled 2018-06-27: qty 5

## 2018-06-27 MED ORDER — FAMOTIDINE IN NACL 20-0.9 MG/50ML-% IV SOLN
20.0000 mg | INTRAVENOUS | Status: AC
  Administered 2018-06-27: 20 mg via INTRAVENOUS
  Filled 2018-06-27: qty 50

## 2018-06-27 MED ORDER — LIDOCAINE HCL (PF) 4 % IJ SOLN
5.0000 mL | Freq: Once | INTRAMUSCULAR | Status: DC
  Filled 2018-06-27: qty 5

## 2018-06-27 MED ORDER — FENTANYL 2500MCG IN NS 250ML (10MCG/ML) PREMIX INFUSION
25.0000 ug/h | INTRAVENOUS | Status: DC
  Administered 2018-06-27: 50 ug/h via INTRAVENOUS
  Administered 2018-06-27: 225 ug/h via INTRAVENOUS
  Filled 2018-06-27: qty 250

## 2018-06-27 MED ORDER — CHLORHEXIDINE GLUCONATE 0.12% ORAL RINSE (MEDLINE KIT)
15.0000 mL | Freq: Two times a day (BID) | OROMUCOSAL | Status: DC
  Administered 2018-06-27 – 2018-06-29 (×6): 15 mL via OROMUCOSAL

## 2018-06-27 MED ORDER — VECURONIUM BROMIDE 10 MG IV SOLR
INTRAVENOUS | Status: AC
  Administered 2018-06-27: 10 mg
  Filled 2018-06-27: qty 10

## 2018-06-27 MED ORDER — TRANEXAMIC ACID-NACL 1000-0.7 MG/100ML-% IV SOLN
1000.0000 mg | Freq: Once | INTRAVENOUS | Status: AC
  Administered 2018-06-27: 1000 mg via INTRAVENOUS
  Filled 2018-06-27: qty 100

## 2018-06-27 MED ORDER — ORAL CARE MOUTH RINSE
15.0000 mL | OROMUCOSAL | Status: DC
  Administered 2018-06-27 – 2018-06-30 (×23): 15 mL via OROMUCOSAL

## 2018-06-27 MED ORDER — FAMOTIDINE IN NACL 20-0.9 MG/50ML-% IV SOLN
20.0000 mg | INTRAVENOUS | Status: DC
  Administered 2018-06-27 – 2018-06-29 (×3): 20 mg via INTRAVENOUS
  Filled 2018-06-27 (×4): qty 50

## 2018-06-27 MED ORDER — IOHEXOL 300 MG/ML  SOLN
75.0000 mL | Freq: Once | INTRAMUSCULAR | Status: AC
  Administered 2018-06-27: 75 mL via INTRAVENOUS

## 2018-06-27 MED ORDER — FENTANYL 2500MCG IN NS 250ML (10MCG/ML) PREMIX INFUSION
20.0000 ug/h | INTRAVENOUS | Status: DC
  Filled 2018-06-27: qty 250

## 2018-06-27 MED ORDER — FENTANYL CITRATE (PF) 100 MCG/2ML IJ SOLN: 50.0000 ug | INTRAMUSCULAR | Status: DC | PRN

## 2018-06-27 MED ORDER — IOHEXOL 300 MG/ML  SOLN: 100.0000 mL | Freq: Once | INTRAMUSCULAR | Status: DC

## 2018-06-27 MED ORDER — DIPHENHYDRAMINE HCL 50 MG/ML IJ SOLN
25.0000 mg | Freq: Once | INTRAMUSCULAR | Status: AC
  Administered 2018-06-27: 25 mg via INTRAVENOUS
  Filled 2018-06-27: qty 1

## 2018-06-27 MED ORDER — LACTATED RINGERS IV BOLUS
500.0000 mL | Freq: Once | INTRAVENOUS | Status: AC
  Administered 2018-06-27: 500 mL via INTRAVENOUS

## 2018-06-27 MED ORDER — PROPOFOL 1000 MG/100ML IV EMUL
0.0000 ug/kg/min | INTRAVENOUS | Status: DC
  Administered 2018-06-27: 20 ug/kg/min via INTRAVENOUS

## 2018-06-27 MED ORDER — SODIUM CHLORIDE 0.9 % IV BOLUS
500.0000 mL | Freq: Once | INTRAVENOUS | Status: AC
  Administered 2018-06-27: 500 mL via INTRAVENOUS

## 2018-06-27 MED ORDER — METHYLPREDNISOLONE SODIUM SUCC 125 MG IJ SOLR
125.0000 mg | Freq: Once | INTRAMUSCULAR | Status: AC
  Administered 2018-06-27: 125 mg via INTRAVENOUS
  Filled 2018-06-27: qty 2

## 2018-06-27 MED ORDER — ASPIRIN 81 MG PO CHEW
81.0000 mg | CHEWABLE_TABLET | Freq: Every day | ORAL | Status: DC
  Administered 2018-06-27 – 2018-06-30 (×4): 81 mg
  Filled 2018-06-27 (×4): qty 1

## 2018-06-27 MED ORDER — FENTANYL CITRATE (PF) 100 MCG/2ML IJ SOLN
50.0000 ug | Freq: Once | INTRAMUSCULAR | Status: AC
  Administered 2018-06-27: 50 ug via INTRAVENOUS

## 2018-06-27 MED ORDER — KETAMINE HCL 50 MG/5ML IJ SOSY
PREFILLED_SYRINGE | INTRAMUSCULAR | Status: AC
  Administered 2018-06-27: 50 mg
  Filled 2018-06-27: qty 5

## 2018-06-27 MED ORDER — PROPOFOL 1000 MG/100ML IV EMUL
INTRAVENOUS | Status: AC
  Administered 2018-06-27: 20 ug/kg/min via INTRAVENOUS
  Filled 2018-06-27: qty 100

## 2018-06-27 MED ORDER — HEPARIN SODIUM (PORCINE) 5000 UNIT/ML IJ SOLN: 5000.0000 [IU] | Freq: Three times a day (TID) | INTRAMUSCULAR | Status: DC

## 2018-06-27 MED ORDER — SUCCINYLCHOLINE CHLORIDE 20 MG/ML IJ SOLN
INTRAMUSCULAR | Status: AC | PRN
  Administered 2018-06-27: 100 mg via INTRAVENOUS

## 2018-06-27 MED ORDER — MIDAZOLAM HCL 2 MG/2ML IJ SOLN
INTRAMUSCULAR | Status: AC
  Administered 2018-06-27: 1 mg via INTRAVENOUS
  Filled 2018-06-27: qty 4

## 2018-06-27 MED ORDER — SODIUM CHLORIDE 0.9 % IV SOLN
INTRAVENOUS | Status: DC
  Administered 2018-06-27 (×3): via INTRAVENOUS

## 2018-06-27 MED ORDER — TRANEXAMIC ACID 1000 MG/10ML IV SOLN
1000.0000 mg | Freq: Once | INTRAVENOUS | Status: DC
  Filled 2018-06-27: qty 10

## 2018-06-27 MED ORDER — MIDAZOLAM HCL 2 MG/2ML IJ SOLN
1.0000 mg | INTRAMUSCULAR | Status: DC | PRN
  Filled 2018-06-27: qty 2

## 2018-06-27 MED ORDER — MIDAZOLAM HCL 2 MG/2ML IJ SOLN
1.0000 mg | INTRAMUSCULAR | Status: DC | PRN
  Administered 2018-06-27 – 2018-06-28 (×2): 1 mg via INTRAVENOUS

## 2018-06-27 MED ORDER — SODIUM CHLORIDE 0.9 % IV BOLUS: 1000.0000 mL | Freq: Once | INTRAVENOUS | Status: DC

## 2018-06-27 MED ORDER — FENTANYL BOLUS VIA INFUSION
25.0000 ug | INTRAVENOUS | Status: DC | PRN
  Administered 2018-06-27 – 2018-06-28 (×2): 25 ug via INTRAVENOUS
  Filled 2018-06-27: qty 25

## 2018-06-27 MED ORDER — MIDAZOLAM HCL 2 MG/2ML IJ SOLN
INTRAMUSCULAR | Status: AC
  Filled 2018-06-27: qty 2

## 2018-06-27 MED ORDER — PRAVASTATIN SODIUM 10 MG PO TABS
20.0000 mg | ORAL_TABLET | Freq: Every day | ORAL | Status: DC
  Administered 2018-06-27 – 2018-06-29 (×3): 20 mg
  Filled 2018-06-27 (×3): qty 2

## 2018-06-27 MED ORDER — METHYLPREDNISOLONE SODIUM SUCC 125 MG IJ SOLR
60.0000 mg | Freq: Two times a day (BID) | INTRAMUSCULAR | Status: DC
  Administered 2018-06-27 – 2018-06-29 (×5): 60 mg via INTRAVENOUS
  Filled 2018-06-27 (×5): qty 2

## 2018-06-27 MED ORDER — DIPHENHYDRAMINE HCL 50 MG/ML IJ SOLN
25.0000 mg | Freq: Four times a day (QID) | INTRAMUSCULAR | Status: AC
  Administered 2018-06-27 – 2018-06-29 (×8): 25 mg via INTRAVENOUS
  Filled 2018-06-27 (×8): qty 1

## 2018-06-27 MED ORDER — HEPARIN (PORCINE) 25000 UT/250ML-% IV SOLN
950.0000 [IU]/h | INTRAVENOUS | Status: DC
  Administered 2018-06-27: 900 [IU]/h via INTRAVENOUS
  Administered 2018-06-28 – 2018-06-29 (×2): 950 [IU]/h via INTRAVENOUS
  Filled 2018-06-27 (×3): qty 250

## 2018-06-27 NOTE — Progress Notes (Signed)
ANTICOAGULATION CONSULT NOTE  Pharmacy Consult:  Heparin Indication: h/o VTE  Allergies  Allergen Reactions  . Lisinopril     angioedema    Patient Measurements: Height: 5\' 7"  (170.2 cm) Weight: 141 lb 1.5 oz (64 kg) IBW/kg (Calculated) : 66.1  Heparin dosing weight = 64 kg  Vital Signs: Temp: 98.6 F (37 C) (02/13 1612) Temp Source: Bladder (02/13 1611) BP: 121/68 (02/13 1600) Pulse Rate: 48 (02/13 1612)  Labs: Recent Labs    06/27/18 0434 06/27/18 0444 06/27/18 0526 06/27/18 1615  HGB  --  13.5 11.9*  --   HCT  --  41.3 35.0*  --   PLT  --  161  --   --   APTT  --   --   --  68*  HEPARINUNFRC  --   --   --  0.31  CREATININE 1.72*  --   --   --      Assessment: [age] YOM presented with angioedema, intubated and transitioned from Xarelto to IV heparin for history of PE.  Patient couldn't state when he last took Xarelto and family was not present.  Based on heparin level and aPTT this PM, the levels are correlating.  Both levels are therapeutic and low normal, but were drawn 5 hours instead of 8 hours post heparin infusion started.  No bleeding reported.  Goal of Therapy:  Heparin level 0.3-0.7 units/ml aPTT 66-102 seconds Monitor platelets by anticoagulation protocol: Yes   Plan:  Increase heparin gtt slightly to 950 units/hr Recheck labs 8 hours post rate adjustment  Guhan Bruington D. Laney Potashang, PharmD, BCPS, BCCCP 06/27/2018, 5:10 PM

## 2018-06-27 NOTE — ED Notes (Signed)
Introduced myself to patient's family. Discussed plan of care and provided oral suctioning to patient. Resting comfortably at this time.

## 2018-06-27 NOTE — ED Notes (Signed)
Caregiver [phone]

## 2018-06-27 NOTE — ED Triage Notes (Signed)
Reports waking up this AM with tongue swelling. Denies SOB, unable to swallow. Pt immediately taken to room. EDP at bedside.

## 2018-06-27 NOTE — Progress Notes (Signed)
ANTICOAGULATION CONSULT NOTE - Initial Consult  Pharmacy Consult for heparin Indication: h/o VTE  Allergies  Allergen Reactions  . Lisinopril     angioedema    Patient Measurements: Height: 5\' 7"  (170.2 cm) Weight: 141 lb 1.5 oz (64 kg) IBW/kg (Calculated) : 66.1  Vital Signs: Temp: 97 F (36.1 C) (02/13 0545) Temp Source: Oral (02/13 0401) BP: 108/68 (02/13 0545) Pulse Rate: 44 (02/13 0545)  Labs: Recent Labs    06/27/18 0526  HGB 11.9*  HCT 35.0*     Medical History: Past Medical History:  Diagnosis Date  . CAD (coronary artery disease), native coronary artery    Coronary calcifications noted on CT scan.   . Cardiomyopathy of undetermined type (HCC)   . Chronic systolic heart failure (HCC)    Echo 2015 EF 30-35%   . CVA (cerebral vascular accident) [facility]) June 2013  . Embedded metal fragments    Metallic Pellet In Heart  . Gout   . History of ischemic vertebrobasilar artery cerebellar stroke   . Hyperlipemia   . Hyperlipidemia   . Hypertension   . Hypertensive heart disease   . Illiteracy   . Pre-diabetes   . Pulmonary embolism (HCC) 01/01/2016  . Vitamin D deficiency     Assessment: 73yo male presents w/ angioedema, now intubated, to transition from Xarelto to heparin for h/o VTE; pt cannot state when he last took Xarelto and family is not present so unclear when Xarelto was last taken.  Goal of Therapy:  Heparin level 0.3-0.7 units/ml aPTT 66-102 seconds Monitor platelets by anticoagulation protocol: Yes   Plan:  At 0800 will start heparin gtt at 900 units/hr and monitor heparin levels, aPTT (while Xarelto affects anti-Xa), and CBC.  Vernard Gambles, PharmD, BCPS  06/27/2018,6:02 AM

## 2018-06-27 NOTE — ED Notes (Signed)
Attempted intubation X1 with Ketamine only. Will attempt X1. 100 succs given.

## 2018-06-27 NOTE — ED Notes (Signed)
Daughter Annice Pih, [phone]

## 2018-06-27 NOTE — Progress Notes (Signed)
Assisted with transport from ED to 35M. On vent. No noted respiratory issues at this time.

## 2018-06-27 NOTE — ED Notes (Signed)
Pt able to communicate with head nodding and gestures, I asked patient if he would like me to increase his sedation to help him relax which he responded with a yes by nodding his head.

## 2018-06-27 NOTE — ED Notes (Signed)
NO metabolic panel was ever ordered on the patient. Ordered one at this time so that we can go to CT with the patient.
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
## 2018-06-27 NOTE — ED Notes (Signed)
EDP, RT, Anesthesia at bedside preparing to intubate for airway protection.

## 2018-06-28 ENCOUNTER — Inpatient Hospital Stay (HOSPITAL_COMMUNITY): Payer: Medicare HMO

## 2018-06-28 DIAGNOSIS — J9600 Acute respiratory failure, unspecified whether with hypoxia or hypercapnia: Secondary | ICD-10-CM

## 2018-06-28 DIAGNOSIS — T783XXD Angioneurotic edema, subsequent encounter: Secondary | ICD-10-CM

## 2018-06-28 LAB — TRIGLYCERIDES: Triglycerides: 27 mg/dL (ref <150)

## 2018-06-28 LAB — POCT I-STAT 7, (LYTES, BLD GAS, ICA,H+H)
Acid-base deficit: 2 mmol/L (ref 0.0–2.0)
Bicarbonate: 22.2 mmol/L (ref 20.0–28.0)
Calcium, Ion: 1.37 mmol/L (ref 1.15–1.40)
HCT: 32 % — ABNORMAL LOW (ref 39.0–52.0)
Hemoglobin: 10.9 g/dL — ABNORMAL LOW (ref 13.0–17.0)
O2 Saturation: 98 %
Potassium: 4.1 mmol/L (ref 3.5–5.1)
Sodium: 143 mmol/L (ref 135–145)
TCO2: 23 mmol/L (ref 22–32)
pCO2 arterial: 36.9 mmHg (ref 32.0–48.0)
pH, Arterial: 7.387 (ref 7.350–7.450)
pO2, Arterial: 106 mmHg (ref 83.0–108.0)

## 2018-06-28 LAB — GLUCOSE, CAPILLARY
GLUCOSE-CAPILLARY: 114 mg/dL — AB (ref 70–99)
Glucose-Capillary: 128 mg/dL — ABNORMAL HIGH (ref 70–99)
Glucose-Capillary: 125 mg/dL — ABNORMAL HIGH (ref 70–99)

## 2018-06-28 LAB — BASIC METABOLIC PANEL
Anion gap: 9 (ref 5–15)
BUN: 25 mg/dL — ABNORMAL HIGH (ref 8–23)
CALCIUM: 9.2 mg/dL (ref 8.9–10.3)
CHLORIDE: 113 mmol/L — AB (ref 98–111)
CO2: 20 mmol/L — AB (ref 22–32)
CREATININE: 1.36 mg/dL — AB (ref 0.61–1.24)
GFR calc Af Amer: >60 mL/min (ref >60)
GFR calc non Af Amer: 52 mL/min — ABNORMAL LOW (ref >60)
Glucose, Bld: 135 mg/dL — ABNORMAL HIGH (ref 70–99)
Potassium: 4.1 mmol/L (ref 3.5–5.1)
Sodium: 142 mmol/L (ref 135–145)

## 2018-06-28 LAB — CBC
HCT: 35.2 % — ABNORMAL LOW (ref 39.0–52.0)
Hemoglobin: 11.8 g/dL — ABNORMAL LOW (ref 13.0–17.0)
MCH: 30.6 pg (ref 26.0–34.0)
MCHC: 33.5 g/dL (ref 30.0–36.0)
MCV: 91.4 fL (ref 80.0–100.0)
Platelets: 121 10*3/uL — ABNORMAL LOW (ref 150–400)
RBC: 3.85 MIL/uL — ABNORMAL LOW (ref 4.22–5.81)
RDW: 12.8 % (ref 11.5–15.5)
WBC: 15.6 10*3/uL — ABNORMAL HIGH (ref 4.0–10.5)
nRBC: 0 % (ref 0.0–0.2)

## 2018-06-28 LAB — PHOSPHORUS
Phosphorus: 3.9 mg/dL (ref 2.5–4.6)
Phosphorus: 4.1 mg/dL (ref 2.5–4.6)

## 2018-06-28 LAB — PROCALCITONIN: Procalcitonin: 0.21 ng/mL

## 2018-06-28 LAB — TROPONIN I: Troponin I: <0.03 ng/mL (ref <0.03)

## 2018-06-28 LAB — MAGNESIUM
Magnesium: 1.7 mg/dL (ref 1.7–2.4)
Magnesium: 1.7 mg/dL (ref 1.7–2.4)

## 2018-06-28 LAB — LACTIC ACID, PLASMA: Lactic Acid, Venous: 1.3 mmol/L (ref 0.5–1.9)

## 2018-06-28 LAB — HEPARIN LEVEL (UNFRACTIONATED): Heparin Unfractionated: 0.57 IU/mL (ref 0.30–0.70)

## 2018-06-28 LAB — APTT: aPTT: 105 seconds — ABNORMAL HIGH (ref 24–36)

## 2018-06-28 MED ORDER — FUROSEMIDE 10 MG/ML IJ SOLN
20.0000 mg | Freq: Once | INTRAMUSCULAR | Status: AC
  Administered 2018-06-28: 20 mg via INTRAVENOUS
  Filled 2018-06-28: qty 2

## 2018-06-28 MED ORDER — SODIUM CHLORIDE 0.9 % IV SOLN
2.0000 g | INTRAVENOUS | Status: DC
  Administered 2018-06-28 – 2018-06-29 (×2): 2 g via INTRAVENOUS
  Filled 2018-06-28 (×3): qty 20

## 2018-06-28 MED ORDER — VITAL HIGH PROTEIN PO LIQD: 1000.0000 mL | ORAL | Status: DC

## 2018-06-28 MED ORDER — VITAL AF 1.2 CAL PO LIQD
1000.0000 mL | ORAL | Status: DC
  Administered 2018-06-28: 1000 mL
  Filled 2018-06-28 (×2): qty 1000

## 2018-06-28 MED ORDER — PROPOFOL 1000 MG/100ML IV EMUL
5.0000 ug/kg/min | INTRAVENOUS | Status: DC
  Administered 2018-06-28: 5 ug/kg/min via INTRAVENOUS
  Filled 2018-06-28: qty 100

## 2018-06-28 MED ORDER — SODIUM CHLORIDE 0.9 % IV SOLN
500.0000 mg | INTRAVENOUS | Status: DC
  Administered 2018-06-28 – 2018-06-29 (×2): 500 mg via INTRAVENOUS
  Filled 2018-06-28 (×3): qty 500

## 2018-06-28 MED ORDER — MAGNESIUM SULFATE 4 GM/100ML IV SOLN
4.0000 g | Freq: Once | INTRAVENOUS | Status: AC
  Administered 2018-06-28: 4 g via INTRAVENOUS
  Filled 2018-06-28: qty 100

## 2018-06-28 NOTE — Progress Notes (Signed)
Initial Nutrition Assessment  DOCUMENTATION CODES:   Not applicable  INTERVENTION:   Tube Feeding:  Vital AF 1.2 at 60 ml/hr Provides 1728 kcals, 108 g of protein and 1166 mL of free water   NUTRITION DIAGNOSIS:   Inadequate oral intake related to acute illness as evidenced by NPO status.  GOAL:   Patient will meet greater than or equal to 90% of their needs   MONITOR:   TF tolerance, Vent status, Labs, Weight trends  REASON FOR ASSESSMENT:   Ventilator    ASSESSMENT:    73 yo male admitted with angioedema requiring intubation. PMH includes HTN, HLD, CHF CAD, CVA   Patient is currently intubated on ventilator support. Pt alert and oriented on vent; able to shake head "yes" and "no" to questions MV: 10.4 L/min Temp (24hrs), Avg:99.7 F (37.6 C), Min:98.6 F (37 C), Max:100.8 F (38.2 C)  OG tube in place  No family at bedside. Pt shakes head "yes" that he has been eating well at home with good appetite PTA.  Current wt 67.5 kg; up from admission wt of 64 kg. Noted weight loss trend per weight encounters, but nothing recently   Labs: BUN 25, Creatinine 1.36 Meds: solumedrol   NUTRITION - FOCUSED PHYSICAL EXAM:    Most Recent Value  Orbital Region  Moderate depletion  Upper Arm Region  Moderate depletion  Thoracic and Lumbar Region  Mild depletion  Buccal Region  Unable to assess  Temple Region  Moderate depletion  Clavicle Bone Region  Mild depletion  Clavicle and Acromion Bone Region  Mild depletion  Scapular Bone Region  Mild depletion  Dorsal Hand  Unable to assess  Patellar Region  No depletion  Anterior Thigh Region  No depletion  Posterior Calf Region  No depletion  Edema (RD Assessment)  None       Diet Order:   Diet Order            Diet NPO time specified  Diet effective now              EDUCATION NEEDS:   Not appropriate for education at this time  Skin:  Skin Assessment: Reviewed RN Assessment  Last BM:  no documented  BM  Height:   Ht Readings from Last 1 Encounters:  06/27/18 5\' 7"  (1.702 m)    Weight:   Wt Readings from Last 1 Encounters:  06/28/18 67.5 kg    BMI:  Body mass index is 23.31 kg/m.  Estimated Nutritional Needs:   Kcal:  1799 kcals   Protein:  90-115 g  Fluid:  >/= 1.8 L   Romelle Starcher MS, RD, LDN, CNSC [phone] Pager  [phone] Weekend/On-Call Pager

## 2018-06-28 NOTE — Progress Notes (Signed)
Call from RN  Trach aspirate - Gm stain positive  Plan  - start CAP Rx

## 2018-06-28 NOTE — Progress Notes (Signed)
NAME:  Dennis [name], MRN:  [id number], DOB:  [birth year], LOS: 1 ADMISSION DATE:  06/27/2018, CONSULTATION DATE:  06/27/18 REFERRING MD:  Manus Gunning  CHIEF COMPLAINT:  Angioedema   Brief History    Dennis [name] is a 73 y.o. M with PMH as outlined below.  He presented to [facility] ED 2/13 with sudden onset of tongue and lip swelling that woke him up from his sleep.  He apparently went to bed in his usual state of health then woke up with severe swelling.  In ED, he was noted to have angioedema and it was felt that his airway needed to be secured.  Prior to intubation, he received 125mg  Solumedrol, 20mg  Famotidine, 25mg  Benadryl, 1g TXA.  PCCM was called for ICU admission.  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  Significant Hospital Events   2/13 > admit.  Consults:  None.  Procedures:  ETT 2/13 >   Significant Diagnostic Tests:  None.  Micro Data:  RVP  Urine strep Urine leg Lactate PCT Blood Urine Resp   Antimicrobials:  None.  Interim history/subjective:    2/14- febrile with wbc rise. Got good cuff leak. Off sedation and following commands but has crackles and setting apnea alarms that rever to normal breathingg when he is promopted +1L volume loverload. AKI better   Objective:  Blood pressure 102/63, pulse (!) 59, temperature (!) 100.6 F (38.1 C), resp. rate (!) 21, height 5\' 7"  (1.702 m), weight 67.5 kg, SpO2 98 %.    Vent Mode: PRVC FiO2 (%):  [40 %] 40 % Set Rate:  [14 bmp] 14 bmp Vt Set:  [520 mL] 520 mL PEEP:  [5 cmH20] 5 cmH20 Plateau Pressure:  [11 cmH20-19 cmH20] 18 cmH20   Intake/Output Summary (Last 24 hours) at 06/28/2018 1317 Last data filed at 06/28/2018 0800 Gross per 24 hour  Intake 2373.61 ml  Output 1725 ml  Net 648.61 ml   Filed Weights   06/27/18 0415 06/28/18 0500  Weight: 64 kg 67.5 kg   General Appeara nce:  Looks criticall  Head:  Normocephalic, without obvious  abnormality, atraumatic Eyes:  PERRL - ues, conjunctiva/corneas - ues     Ears:  Normal external ear canals, both ears Nose:  G tube - no Throat:  ETT TUBE - yes , OG tube - yes. Angioedema +. Able to talk around cuff Neck:  Supple,  No enlargement/tenderness/nodules Lungs: Clear to auscultation bilaterally, Ventilator   Synchrony - no when on PSV Heart:  S1 and S2 normal, no murmur, CVP - no.  Pressors - no Abdomen:  Soft, no masses, no organomegaly Genitalia / Rectal:  Not done Extremities:  Extremities- intact Skin:  ntact in exposed areas .  Neurologic:  Sedation - none -> RASS - +1 . Moves all 4s - yes. CAM-ICU - neg . Orientation - x3+      LABS    PULMONARY Recent Labs  Lab 06/27/18 0526 06/28/18 0352  PHART 7.333* 7.387  PCO2ART 45.2 36.9  PO2ART 167.0* 106.0  HCO3 24.0 22.2  TCO2 25 23  O2SAT 99.0 98.0    CBC Recent Labs  Lab 06/27/18 0444 06/27/18 0526 06/28/18 0227 06/28/18 0352  HGB 13.5 11.9* 11.8* 10.9*  HCT 41.3 35.0* 35.2* 32.0*  WBC 10.0  --  15.6*  --   PLT 161  --  121*  --     COAGULATION No results  for input(s): INR in the last 168 hours.  CARDIAC  No results for input(s): TROPONINI in the last 168 hours. No results for input(s): PROBNP in the last 168 hours.   CHEMISTRY Recent Labs  Lab 06/27/18 0434 06/27/18 0526 06/28/18 0227 06/28/18 0352  NA 142 140 142 143  K 4.4 3.8 4.1 4.1  CL 104  --  113*  --   CO2 25  --  20*  --   GLUCOSE 117*  --  135*  --   BUN 35*  --  25*  --   CREATININE 1.72*  --  1.36*  --   CALCIUM 9.9  --  9.2  --   MG  --   --  1.7  --   PHOS  --   --  3.9  --    Estimated Creatinine Clearance: 47.3 mL/min (A) (by C-G formula based on SCr of 1.36 mg/dL (H)).   LIVER Recent Labs  Lab 06/27/18 0434  AST 19  ALT 12  ALKPHOS 60  BILITOT 0.7  PROT 6.9  ALBUMIN 3.7     INFECTIOUS No results for input(s): LATICACIDVEN, PROCALCITON in the last 168 hours.   ENDOCRINE CBG (last 3)  Recent  Labs    06/27/18 2309 06/28/18 0347 06/28/18 0719  GLUCAP 128* 128* 114*         IMAGING x48h  - image(s) personally visualized  -   highlighted in bold Dg Abd 1 View  Result Date: 06/27/2018 CLINICAL DATA:  Encounter for orogastric tube placement. EXAM: ABDOMEN - 1 VIEW COMPARISON:  None. FINDINGS: Orogastric tube passes below the diaphragm to curl well within the stomach. Normal bowel gas pattern. IMPRESSION: Well-positioned orogastric tube. Electronically Signed   By: Amie Portlandavid  Ormond M.D.   On: 06/27/2018 12:43   Ct Soft Tissue Neck W Contrast  Result Date: 06/27/2018 CLINICAL DATA:  Awoke today with swelling of the tongue. EXAM: CT NECK WITH CONTRAST TECHNIQUE: Multidetector CT imaging of the neck was performed using the standard protocol following the bolus administration of intravenous contrast. CONTRAST:  75mL OMNIPAQUE IOHEXOL 300 MG/ML  SOLN COMPARISON:  01/31/2015 FINDINGS: Pharynx and larynx: Patient is intubated. There is an orogastric tube also in place. Patient does appear to have diffuse swelling of the tongue with edema in the soft tissue planes of the sublingual and submandibular spaces well. This could be due to angio edema or infection. No evidence of a focal abscess or focal lesion. Salivary glands: Parotid and submandibular glands transit plea normal. There is some regional swelling surrounding the submandibular glands. Thyroid: Normal Lymph nodes: No enlarged or low-density nodes on either side of the neck. Vascular: Atherosclerotic disease at both carotid bifurcations. No venous thrombosis. Limited intracranial: Old bilateral strokes as previously shown by head CT. Visualized orbits: Normal Mastoids and visualized paranasal sinuses: Mucosal inflammatory changes of the paranasal sinuses. Fluid levels in the maxillary sinuses consistent with maxillary sinusitis. Small sphenoid sinus air-fluid levels. Mastoids are clear. Skeleton: Ordinary cervical spondylosis. Upper chest:  Clear Other: None IMPRESSION: Nonspecific soft tissue swelling of the tongue and edema within the sublingual and submandibular spaces. This is nonspecific and could be due to angio edema or infectious inflammation. No evidence of a focal drainable collection. Electronically Signed   By: Paulina FusiMark  Shogry M.D.   On: 06/27/2018 09:35   Dg Chest Port 1 View  Result Date: 06/28/2018 CLINICAL DATA:  Respiratory failure EXAM: PORTABLE CHEST 1 VIEW COMPARISON:  Yesterday FINDINGS: Endotracheal tube tip at  the clavicular heads. The orogastric tube tip is in the stomach. Improved aeration with better defined left diaphragm. Indistinct opacities at the bases on both sides. Chronic cardiomegaly with BB over the left heart. IMPRESSION: 1. Unremarkable hardware positioning. 2. Mild improvement in lower lobe airspace disease. Electronically Signed   By: Marnee Spring M.D.   On: 06/28/2018 07:37   Dg Chest Port 1 View  Result Date: 06/27/2018 CLINICAL DATA:  Intubation and orogastric tube placement EXAM: PORTABLE CHEST 1 VIEW COMPARISON:  01/01/2016 FINDINGS: Chronic cardiomegaly with vascular pedicle widening. Endotracheal tube tip at the clavicular heads. The orogastric tube reaches the stomach. Low volume chest with hazy density at both bases. No Kerley lines, definite effusion, or pneumothorax. IMPRESSION: 1. Unremarkable hardware positioning. 2. Low volume chest with indistinct opacities at the bases that could be atelectasis, infection, or aspiration. 3. Cardiomegaly Electronically Signed   By: Marnee Spring M.D.   On: 06/27/2018 04:49     Assessment & Plan:   Angioedema - presumed due to ACEi (Lisinopril).  S/p intubation in ED. - > Acute resp failure  - improved  On 06/28/2018 to point he can be extubated. However, setting apnea alarms during PSV for unclear reaons   - PSV and aim to extubate if ok - Lasix x 1 - Continue steroids, Pepcid, Benadryl. - Bronchial hygiene. - Follow CXR. - NO ACE INHIBIORS  EVER!!! (ADDED TO ALLERGY LIST)  Hx HTN, HLD, combined CHF, DCM, CAD. - Hold preadmission carvedilol. - Continue preadmission ASA, pravastatin. - - check trop  Hx PE (on xarelto). - Heparin gtt in lieu of preadmission xarelto.  Hx depression, dementia. - Hold preadmission donepezil, sertraline.  Mild low mag  - replete mag  SIRS with fever and wbc rise. ? Sepsis due tp pneumonia  - pan culture  - check sepsi biomarkers - antibiotics based on results   Best practice:  Diet: NPO. Pain/Anxiety/Delirium protocol (if indicated): fentanyl gtt / midazolam PRN.  RASS goal 0 to -1. VAP protocol (if indicated): In place. DVT prophylaxis: SCD's / Heparin gtt. GI prophylaxis: Famotidine. Glucose control: N/A. Mobility: Bedrest. Code Status: Full. Family Communication: None available. Disposition: ICU   ATTESTATION & SIGNATURE   The patient Dennis [name] is critically ill with multiple organ systems failure and requires high complexity decision making for assessment and support, frequent evaluation and titration of therapies, application of advanced monitoring technologies and extensive interpretation of multiple databases.   Critical Care Time devoted to patient care services described in this note is  30  Minutes. This time reflects time of care of this signee Dr Kalman Shan. This critical care time does not reflect procedure time, or teaching time or supervisory time of PA/NP/Med student/Med Resident etc but could involve care discussion time     Dr. Kalman Shan, M.D., [facility].C.P Pulmonary and Critical Care Medicine Staff Physician [facility] [place] [facility] Pager: [phone], If no answer or between  15:00h - 7:00h: call [phone]  06/28/2018 1:17 PM

## 2018-06-28 NOTE — Progress Notes (Signed)
ANTICOAGULATION CONSULT NOTE  Pharmacy Consult:  Heparin Indication: h/o VTE  Allergies  Allergen Reactions  . Lisinopril     angioedema    Patient Measurements: Height: 5\' 7"  (170.2 cm) Weight: 141 lb 1.5 oz (64 kg) IBW/kg (Calculated) : 66.1  Heparin dosing weight = 64 kg  Vital Signs: Temp: 99.3 F (37.4 C) (02/14 0200) Temp Source: Bladder (02/14 0000) BP: 122/70 (02/14 0200) Pulse Rate: 52 (02/14 0200)  Labs: Recent Labs    06/27/18 0434  06/27/18 0444 06/27/18 0526 06/27/18 1615 06/28/18 0227  HGB  --    < > 13.5 11.9*  --  11.8*  HCT  --   --  41.3 35.0*  --  35.2*  PLT  --   --  161  --   --  121*  APTT  --   --   --   --  68* 105*  HEPARINUNFRC  --   --   --   --  0.31 0.57  CREATININE 1.72*  --   --   --   --   --    < > = values in this interval not displayed.     Assessment: 87 YOM presented with angioedema, intubated and transitioned from Xarelto to IV heparin for history of PE.  Patient couldn't state when he last took Xarelto and family was not present. Heparin level and aPTT are now correlating. Heparin level is therapeutic. Platelets have decreased. No bleeding noted.   Goal of Therapy:  Heparin level 0.3-0.7 units/ml Monitor platelets by anticoagulation protocol: Yes   Plan:  Continue heparin at 950 units/hr Daily heparin level and CBC  Lysle Pearl, PharmD, BCPS Please see AMION for all pharmacy numbers 06/28/2018 2:59 AM

## 2018-06-28 NOTE — Progress Notes (Deleted)
Family in agreement with transition to full comfort care. MD notified. 

## 2018-06-29 ENCOUNTER — Inpatient Hospital Stay (HOSPITAL_COMMUNITY): Payer: Medicare HMO

## 2018-06-29 LAB — CBC
HCT: 31.3 % — ABNORMAL LOW (ref 39.0–52.0)
Hemoglobin: 10.5 g/dL — ABNORMAL LOW (ref 13.0–17.0)
MCH: 31.1 pg (ref 26.0–34.0)
MCHC: 33.5 g/dL (ref 30.0–36.0)
MCV: 92.6 fL (ref 80.0–100.0)
Platelets: 103 10*3/uL — ABNORMAL LOW (ref 150–400)
RBC: 3.38 MIL/uL — AB (ref 4.22–5.81)
RDW: 13.1 % (ref 11.5–15.5)
WBC: 9.4 10*3/uL (ref 4.0–10.5)
nRBC: 0 % (ref 0.0–0.2)

## 2018-06-29 LAB — GLUCOSE, CAPILLARY
Glucose-Capillary: 106 mg/dL — ABNORMAL HIGH (ref 70–99)
Glucose-Capillary: 125 mg/dL — ABNORMAL HIGH (ref 70–99)
Glucose-Capillary: 126 mg/dL — ABNORMAL HIGH (ref 70–99)
Glucose-Capillary: 134 mg/dL — ABNORMAL HIGH (ref 70–99)
Glucose-Capillary: 151 mg/dL — ABNORMAL HIGH (ref 70–99)
Glucose-Capillary: 159 mg/dL — ABNORMAL HIGH (ref 70–99)

## 2018-06-29 LAB — BASIC METABOLIC PANEL
ANION GAP: 4 — AB (ref 5–15)
BUN: 30 mg/dL — ABNORMAL HIGH (ref 8–23)
CO2: 25 mmol/L (ref 22–32)
Calcium: 8.7 mg/dL — ABNORMAL LOW (ref 8.9–10.3)
Chloride: 115 mmol/L — ABNORMAL HIGH (ref 98–111)
Creatinine, Ser: 1.48 mg/dL — ABNORMAL HIGH (ref 0.61–1.24)
GFR calc non Af Amer: 47 mL/min — ABNORMAL LOW (ref >60)
GFR, EST AFRICAN AMERICAN: 55 mL/min — AB (ref >60)
Glucose, Bld: 162 mg/dL — ABNORMAL HIGH (ref 70–99)
Potassium: 4 mmol/L (ref 3.5–5.1)
Sodium: 144 mmol/L (ref 135–145)

## 2018-06-29 LAB — PHOSPHORUS: Phosphorus: 1.9 mg/dL — ABNORMAL LOW (ref 2.5–4.6)

## 2018-06-29 LAB — LEGIONELLA PNEUMOPHILA SEROGP 1 UR AG: L. pneumophila Serogp 1 Ur Ag: NEGATIVE

## 2018-06-29 LAB — TROPONIN I

## 2018-06-29 LAB — MAGNESIUM: Magnesium: 2.5 mg/dL — ABNORMAL HIGH (ref 1.7–2.4)

## 2018-06-29 LAB — URINE CULTURE: Culture: NO GROWTH

## 2018-06-29 LAB — HEPARIN LEVEL (UNFRACTIONATED): Heparin Unfractionated: 0.51 IU/mL (ref 0.30–0.70)

## 2018-06-29 LAB — LACTIC ACID, PLASMA: Lactic Acid, Venous: 1.6 mmol/L (ref 0.5–1.9)

## 2018-06-29 MED ORDER — SODIUM PHOSPHATES 45 MMOLE/15ML IV SOLN
10.0000 mmol | Freq: Once | INTRAVENOUS | Status: AC
  Administered 2018-06-29: 10 mmol via INTRAVENOUS
  Filled 2018-06-29: qty 3.33

## 2018-06-29 MED ORDER — METHYLPREDNISOLONE SODIUM SUCC 125 MG IJ SOLR
60.0000 mg | INTRAMUSCULAR | Status: DC
  Administered 2018-06-30: 60 mg via INTRAVENOUS
  Filled 2018-06-29: qty 2

## 2018-06-29 NOTE — Progress Notes (Signed)
Sitting talking Feeling well Ate  VS stable - HR 60s  Plan To triad 06/30/18 - d/w [place] Tele for 1 more day  Move to tele    SIGNATURE    Dr. Kalman Shan, M.D., F.C.C.P,  Pulmonary and Critical Care Medicine Staff Physician, [facility] Director - Interstitial Lung Disease  Program  Pulmonary Fibrosis [facility] [place], [place], [address]  Pager: [phone], If no answer or between  15:00h - 7:00h: call [phone] Telephone: [phone]  4:13 PM 06/29/2018

## 2018-06-29 NOTE — Progress Notes (Addendum)
NAME:  Dennis PeeJames E Pressnell, MRN:  [id number], DOB:  1945/10/27, LOS: 2 ADMISSION DATE:  06/27/2018, CONSULTATION DATE:  06/27/18 REFERRING MD:  Manus Gunningancour  CHIEF COMPLAINT:  Angioedema   Brief History    Dennis [name] is a 73 y.o. M with PMH as outlined below.  He presented [facility] ED 2/13 with sudden onset of tongue and lip swelling that woke him up from his sleep.  He apparently went to bed in his usual state of health then woke up with severe swelling.  In ED, he was noted to have angioedema and it was felt that his airway needed to be secured.  Prior to intubation, he received 125mg  Solumedrol, 20mg  Famotidine, 25mg  Benadryl, 1g TXA.  PCCM was called for ICU admission.  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  Significant Hospital Events   2/13 > admit.  2/14- febrile with wbc rise. Got good cuff leak. Off sedation and following commands but has crackles and setting apnea alarms that rever to normal breathingg when he is promopted +1L volume loverload. AKI better   Consults:  None.  Procedures:  ETT 2/13 > 2/1  Significant Diagnostic Tests:  None.  Micro Data: 2/14/  RVP  Urine strep Urine leg Lactate PCT Blood Urine Resp  HIV - 2/13 0- NR  Antimicrobials:   Anti-infectives (From admission, onward)   Start     Dose/Rate Route Frequency Ordered Stop   06/28/18 1600  cefTRIAXone (ROCEPHIN) 2 g in sodium chloride 0.9 % 100 mL IVPB     2 g 200 mL/hr over 30 Minutes Intravenous Every 24 hours 06/28/18 1508     06/28/18 1600  azithromycin (ZITHROMAX) 500 mg in sodium chloride 0.9 % 250 mL IVPB     500 mg 250 mL/hr over 60 Minutes Intravenous Every 24 hours 06/28/18 1508         Interim history/subjective:   2/15 - fever down after starting CAP Rx yesterday. Good cuff leak +. Meets extubation criteria . Doing SBT now. Micro still pending  Objective:  Blood pressure 121/84, pulse (!) 46, temperature 98.2  F (36.8 C), temperature source Bladder, resp. rate 14, height 5\' 7"  (1.702 m), weight 67 kg, SpO2 100 %.    Vent Mode: CPAP;PSV FiO2 (%):  [40 %] 40 % Set Rate:  [14 bmp] 14 bmp Vt Set:  [520 mL] 520 mL PEEP:  [5 cmH20] 5 cmH20 Pressure Support:  [5 cmH20] 5 cmH20 Plateau Pressure:  [16 cmH20-18 cmH20] 16 cmH20   Intake/Output Summary (Last 24 hours) at 06/29/2018 1011 Last data filed at 06/29/2018 0800 Gross per 24 hour  Intake 2986.99 ml  Output 1525 ml  Net 1461.99 ml   Filed Weights   06/27/18 0415 06/28/18 0500 06/29/18 0500  Weight: 64 kg 67.5 kg 67 kg    General Appearance:  Looks better Head:  Normocephalic, without obvious abnormality, atraumatic Eyes:  PERRL - yes, conjunctiva/corneas - clear/muddy     Ears:  Normal external ear canals, both ears Nose:  G tube - no Throat:  ETT TUBE - yes , OG tube - yes. NO angioedema Neck:  Supple,  No enlargement/tenderness/nodules Lungs: Clear to auscultation bilaterally, Ventilator   Synchrony - yes on SBT Heart:  S1 and S2 normal, no murmur, CVP - no.  Pressors - no Abdomen:  Soft, no masses, no organomegaly Genitalia / Rectal:  Not done Extremities:  Extremities-  intact Skin:  ntact in exposed areas . Sacral area - not examined Neurologic:  Sedation - none -> RASS - +1 . Moves all 4s - yes. CAM-ICU - neg . Orientation - yes, watching TV      LABS    PULMONARY Recent Labs  Lab 06/27/18 0526 06/28/18 0352  PHART 7.333* 7.387  PCO2ART 45.2 36.9  PO2ART 167.0* 106.0  HCO3 24.0 22.2  TCO2 25 23  O2SAT 99.0 98.0    CBC Recent Labs  Lab 06/27/18 0444  06/28/18 0227 06/28/18 0352 06/29/18 0444  HGB 13.5   < > 11.8* 10.9* 10.5*  HCT 41.3   < > 35.2* 32.0* 31.3*  WBC 10.0  --  15.6*  --  9.4  PLT 161  --  121*  --  103*   < > = values in this interval not displayed.    COAGULATION No results for input(s): INR in the last 168 hours.  CARDIAC   Recent Labs  Lab 06/28/18 1421 06/29/18 0444  TROPONINI  <0.03 <0.03   No results for input(s): PROBNP in the last 168 hours.   CHEMISTRY Recent Labs  Lab 06/27/18 0434 06/27/18 0526 06/28/18 0227 06/28/18 0352 06/28/18 1754 06/29/18 0444  NA 142 140 142 143  --  144  K 4.4 3.8 4.1 4.1  --  4.0  CL 104  --  113*  --   --  115*  CO2 25  --  20*  --   --  25  GLUCOSE 117*  --  135*  --   --  162*  BUN 35*  --  25*  --   --  30*  CREATININE 1.72*  --  1.36*  --   --  1.48*  CALCIUM 9.9  --  9.2  --   --  8.7*  MG  --   --  1.7  --  1.7 2.5*  PHOS  --   --  3.9  --  4.1 1.9*   Estimated Creatinine Clearance: 43.4 mL/min (A) (by C-G formula based on SCr of 1.48 mg/dL (H)).   LIVER Recent Labs  Lab 06/27/18 0434  AST 19  ALT 12  ALKPHOS 60  BILITOT 0.7  PROT 6.9  ALBUMIN 3.7     INFECTIOUS Recent Labs  Lab 06/28/18 1421 06/29/18 0444  LATICACIDVEN 1.3 1.6  PROCALCITON 0.21  --      ENDOCRINE CBG (last 3)  Recent Labs    06/29/18 0020 06/29/18 0450 06/29/18 0741  GLUCAP 134* 151* 159*         IMAGING x48h  - image(s) personally visualized  -   highlighted in bold Dg Abd 1 View  Result Date: 06/27/2018 CLINICAL DATA:  Encounter for orogastric tube placement. EXAM: ABDOMEN - 1 VIEW COMPARISON:  None. FINDINGS: Orogastric tube passes below the diaphragm to curl well within the stomach. Normal bowel gas pattern. IMPRESSION: Well-positioned orogastric tube. Electronically Signed   By: [name] M.D.   On: 06/27/2018 12:43   Dg Chest Port 1 View  Result Date: 06/29/2018 CLINICAL DATA:  Ventilator dependent respiratory failure. Angioedema. EXAM: PORTABLE CHEST 1 VIEW COMPARISON:  06/28/2018 and earlier, including CTA chest 01/01/2016. FINDINGS: Endotracheal tube tip in satisfactory position projecting approximately 6 cm above the carina. Nasogastric tube looped in the stomach with its tip in the fundus. Cardiac silhouette mildly to moderately enlarged for AP portable technique, unchanged. Airspace  consolidation in the lower lobes, LEFT greater than RIGHT, worse than  yesterday. No new pulmonary parenchymal abnormalities elsewhere. Mild pulmonary venous hypertension without overt edema. IMPRESSION: 1.  Support apparatus satisfactory. 2. Worsening bibasilar atelectasis and/or pneumonia since yesterday. 3. Stable cardiomegaly. Pulmonary venous hypertension without overt edema. Electronically Signed   By: Hulan Saas M.D.   On: 06/29/2018 07:51   Dg Chest Port 1 View  Result Date: 06/28/2018 CLINICAL DATA:  Respiratory failure EXAM: PORTABLE CHEST 1 VIEW COMPARISON:  Yesterday FINDINGS: Endotracheal tube tip at the clavicular heads. The orogastric tube tip is in the stomach. Improved aeration with better defined left diaphragm. Indistinct opacities at the bases on both sides. Chronic cardiomegaly with BB over the left heart. IMPRESSION: 1. Unremarkable hardware positioning. 2. Mild improvement in lower lobe airspace disease. Electronically Signed   By: Marnee Spring M.D.   On: 06/28/2018 07:37     Assessment & Plan:   Angioedema - presumed due to ACEi (Lisinopril).  S/p intubation in ED. - > Acute resp failure  06/29/2018 - > does yes meet criteria for Extubation in setting of Acute Respiratory Failure due to angioedema  - PLAN -  -extubate - NO ACE INHIBIORS EVER!!! (ADDED TO ALLERGY LIST) - - off benadryl - - reduce IV steroids - and wean clinically over next few days  Hx HTN, HLD, combined CHF, DCM, CAD. - Hold preadmission carvedilol. - Continue preadmission ASA, pravastatin. - - check trop  Hx PE (on xarelto). - Heparin gtt in lieu of preadmission xarelto. - - xarelto when can eat  Hx depression, dementia. - Hold preadmission donepezil, sertraline.  Mild low phos  - replete phos  SIRS with fever and wbc rise. ? Sepsis due tp pneumonia 2/14.  - responded to CAP Rx  -await  pan culture - dc abx when able     Best practice:  Diet:  NPO post extubatino x  4h Pain/Anxiety/Delirium protocol (if indicated): fentanyl gtt / midazolam PRN.  RASS goal 0 to -1. VAP protocol (if indicated): In place. DVT prophylaxis: SCD's / Heparin gtt.-> for xarelto 06/30/18 GI prophylaxis: Famotidine. Glucose control: N/A. Mobility: Bedrest. Code Status: Full. Family Communication: none at bedside 06/29/2018 Disposition: ICU . LAter 06/29/2018 - floor if well    SIGNATURE    Dr. Kalman Shan, M.D., F.C.C.P,  Pulmonary and Critical Care Medicine Staff Physician, [facility] Center Director - Interstitial Lung Disease  Program  Pulmonary Fibrosis [facility] Network at [facility] [place], [place], [address]  Pager: [phone], If no answer or between  15:00h - 7:00h: call [phone] Telephone: [phone]  10:18 AM 06/29/2018

## 2018-06-29 NOTE — Procedures (Signed)
Extubation Procedure Note  Patient Details:   Name: Dennis [name] DOB: [birth year] MRN: [id number]   Airway Documentation:    Vent end date: 06/29/18 Vent end time: 1055   Evaluation  O2 sats: stable throughout Complications: No apparent complications Patient did tolerate procedure well. Bilateral Breath Sounds: Diminished   Yes   Pt extubated per MD order.  Pt tolerated well.  Pt is on 4L [place] with sats of 100%.  RT will continue to monitor.  Ronny Flurry 06/29/2018, 10:57 AM

## 2018-06-29 NOTE — Progress Notes (Signed)
Pt arrived to floor was settled into the bed. Pt is alert and talkative.

## 2018-06-29 NOTE — Progress Notes (Addendum)
ANTICOAGULATION CONSULT NOTE  Pharmacy Consult:  Heparin Indication: h/o VTE  Allergies  Allergen Reactions  . Lisinopril Other (See Comments)    angioedema    Patient Measurements: Height: 5\' 7"  (170.2 cm) Weight: 147 lb 11.3 oz (67 kg) IBW/kg (Calculated) : 66.1  Heparin dosing weight = 64 kg  Vital Signs: Temp: 98.4 F (36.9 C) (02/15 1134) Temp Source: Core (02/15 1134) BP: 118/61 (02/15 1000) Pulse Rate: 30 (02/15 1000)  Labs: Recent Labs    06/27/18 0434 06/27/18 0444  06/27/18 1615 06/28/18 0227 06/28/18 0352 06/28/18 1421 06/29/18 0444  HGB  --  13.5   < >  --  11.8* 10.9*  --  10.5*  HCT  --  41.3   < >  --  35.2* 32.0*  --  31.3*  PLT  --  161  --   --  121*  --   --  103*  APTT  --   --   --  68* 105*  --   --   --   HEPARINUNFRC  --   --   --  0.31 0.57  --   --  0.51  CREATININE 1.72*  --   --   --  1.36*  --   --  1.48*  TROPONINI  --   --   --   --   --   --  <0.03 <0.03   < > = values in this interval not displayed.     Assessment: 76 YOM presented with angioedema, intubated and transitioned from Xarelto to IV heparin for history of PE.  Patient couldn't state when he last took Xarelto and family was not present. Heparin level and aPTT are now correlating.   Heparin level remains therapeutic. Hgb is low-stable.  Platelets have decreased and trending down. Currently ~40% decrease in 3 days. No bleeding noted. No noted recent Heparin prior to this admission. Baseline Platelet level ~160-190. Patient is also on concomitant aspirin therapy.   Goal of Therapy:  Heparin level 0.3-0.7 units/ml Monitor platelets by anticoagulation protocol: Yes   Plan:  Continue heparin at 950 units/hr Daily heparin level and CBC *Watch Plts closely F/u transition to Xarelto now that extubated  Link Snuffer, PharmD, BCPS, BCCCP Clinical Pharmacist Please refer to [facility] for [facility] Pharmacy numbers 06/29/2018 12:28 PM

## 2018-06-30 DIAGNOSIS — T783XXA Angioneurotic edema, initial encounter: Principal | ICD-10-CM

## 2018-06-30 DIAGNOSIS — Z978 Presence of other specified devices: Secondary | ICD-10-CM

## 2018-06-30 LAB — BASIC METABOLIC PANEL
ANION GAP: 9 (ref 5–15)
BUN: 28 mg/dL — ABNORMAL HIGH (ref 8–23)
CO2: 21 mmol/L — ABNORMAL LOW (ref 22–32)
Calcium: 9.1 mg/dL (ref 8.9–10.3)
Chloride: 113 mmol/L — ABNORMAL HIGH (ref 98–111)
Creatinine, Ser: 1.28 mg/dL — ABNORMAL HIGH (ref 0.61–1.24)
GFR calc non Af Amer: 56 mL/min — ABNORMAL LOW (ref >60)
Glucose, Bld: 101 mg/dL — ABNORMAL HIGH (ref 70–99)
Potassium: 3.9 mmol/L (ref 3.5–5.1)
Sodium: 143 mmol/L (ref 135–145)

## 2018-06-30 LAB — CBC
HCT: 31 % — ABNORMAL LOW (ref 39.0–52.0)
Hemoglobin: 10.5 g/dL — ABNORMAL LOW (ref 13.0–17.0)
MCH: 31.4 pg (ref 26.0–34.0)
MCHC: 33.9 g/dL (ref 30.0–36.0)
MCV: 92.8 fL (ref 80.0–100.0)
Platelets: 116 10*3/uL — ABNORMAL LOW (ref 150–400)
RBC: 3.34 MIL/uL — ABNORMAL LOW (ref 4.22–5.81)
RDW: 13.2 % (ref 11.5–15.5)
WBC: 11.1 10*3/uL — ABNORMAL HIGH (ref 4.0–10.5)
nRBC: 0 % (ref 0.0–0.2)

## 2018-06-30 LAB — GLUCOSE, CAPILLARY
Glucose-Capillary: 103 mg/dL — ABNORMAL HIGH (ref 70–99)
Glucose-Capillary: 112 mg/dL — ABNORMAL HIGH (ref 70–99)
Glucose-Capillary: 90 mg/dL (ref 70–99)

## 2018-06-30 LAB — CULTURE, RESPIRATORY W GRAM STAIN: Culture: NORMAL

## 2018-06-30 LAB — HEPARIN LEVEL (UNFRACTIONATED): HEPARIN UNFRACTIONATED: 0.27 [IU]/mL — AB (ref 0.30–0.70)

## 2018-06-30 LAB — PHOSPHORUS: Phosphorus: 2.2 mg/dL — ABNORMAL LOW (ref 2.5–4.6)

## 2018-06-30 LAB — MAGNESIUM: Magnesium: 2.3 mg/dL (ref 1.7–2.4)

## 2018-06-30 MED ORDER — METHYLPREDNISOLONE 4 MG PO TBPK: ORAL_TABLET | ORAL | 0 refills | Status: DC

## 2018-06-30 NOTE — Discharge Summary (Signed)
Dennis [name]:[id number] DOB: [birth year] DOA: 06/27/2018  PCP: Dennis Cowboy, MD  Admit date: 06/27/2018  Discharge date: 06/30/2018  Admitted From: Home  Disposition:  Home   Recommendations for Outpatient Follow-up:   Follow up with PCP in 1-2 weeks  PCP Please obtain BMP/CBC, 2 view CXR in 1week,  (see Discharge instructions)   PCP Please follow up on the following pending results: Avoid ACE/ARB.   Home Health: None   Equipment/Devices: None  Consultations: PCCM Discharge Condition: Stable   CODE STATUS: Full   Diet Recommendation: Heart Healthy     Chief Complaint  Patient presents with  . Angioedema     Brief history of present illness from the day of admission and additional interim summary    Dennis [name] is a 73 y.o. M with PMH as outlined below.  He presented to [facility] ED 2/13 with sudden onset of tongue and lip swelling that woke him up from his sleep.  He apparently went to bed in his usual state of health then woke up with severe swelling.  In ED, he was noted to have angioedema and it was felt that his airway needed to be secured.  Prior to intubation, he received 125mg  Solumedrol, 20mg  Famotidine, 25mg  Benadryl, 1g TXA.                                                                 Hospital Course    1. Angioedema due to ACE use -needed ICU admission for intubation and airway protection, was given steroids, offending medications were held, now back to baseline extubated.  Symptom-free.  No tongue swelling no throat itching.  Stable on room air ACE inhibitor has been discontinued will be discharged home with Medrol Dosepak.  Requested to follow with PCP in 3 days.  No signs of infection or sepsis.  2.  History of hypertension, dyslipidemia, chronic combined systolic and diastolic CHF,  diastolic cardiomyopathy, CAD, mild dementia & PE.  Home medications continued with the exception of ACE inhibitor which was discontinued due to #1 above, he is at baseline requested to follow with PCP in 3 days.  PCP can consider adding BiDil for underlying systolic CHF once blood pressure has been deemed stable on next visit.   Discharge diagnosis     Active Problems:   Angioedema    Discharge instructions    Discharge Instructions    Discharge instructions   Complete by:  As directed    Follow with Primary MD Dennis Cowboy, MD in 3 days   Get CBC, CMP, 2 view Chest X ray -  checked  by Primary MD  in 3 days    Activity: As tolerated with Full fall precautions use walker/cane & assistance as needed  Disposition Home    Diet: Heart Healthy -  do not consume shellfish or related products.  Special Instructions: If you have smoked or chewed Tobacco  in the last 2 yrs please stop smoking, stop any regular Alcohol  and or any Recreational drug use.  On your next visit with your primary care physician please Get Medicines reviewed and adjusted.  Please request your Prim.MD to go over all Hospital Tests and Procedure/Radiological results at the follow up, please get all Hospital records sent to your Prim MD by signing hospital release before you go home.  If you experience worsening of your admission symptoms, develop shortness of breath, life threatening emergency, suicidal or homicidal thoughts you must seek medical attention immediately by calling 911 or calling your MD immediately  if symptoms less severe.  You Must read complete instructions/literature along with all the possible adverse reactions/side effects for all the Medicines you take and that have been prescribed to you. Take any new Medicines after you have completely understood and accpet all the possible adverse reactions/side effects.   Increase activity slowly   Complete by:  As directed       Discharge  Medications   Allergies as of 06/30/2018      Reactions   Lisinopril Other (See Comments)   angioedema      Medication List    STOP taking these medications   lisinopril 10 MG tablet Commonly known as:  PRINIVIL,ZESTRIL     TAKE these medications   amLODipine 10 MG tablet Commonly known as:  NORVASC Take 10 mg by mouth daily.   carvedilol 25 MG tablet Commonly known as:  COREG TAKE 1 TABLET 2 X/ DAY FOR BP & HEART What changed:  See the new instructions.   donepezil 5 MG tablet Commonly known as:  ARICEPT TAKE 1 TABLET DAILY What changed:    how much to take  how to take this  when to take this  additional instructions   furosemide 40 MG tablet Commonly known as:  LASIX Take 40 mg by mouth daily.   methylPREDNISolone 4 MG Tbpk tablet Commonly known as:  MEDROL DOSEPAK follow package directions   pravastatin 20 MG tablet Commonly known as:  PRAVACHOL TAKE 1 TABLET AT BEDTIME What changed:    how much to take  how to take this  when to take this  additional instructions   sertraline 50 MG tablet Commonly known as:  ZOLOFT TAKE 1 TABLET DAILY FOR MOOD What changed:  See the new instructions.   tamsulosin 0.4 MG Caps capsule Commonly known as:  FLOMAX TAKE 1 CAPSULE DAILY AT BEDTIME FOR PROSTATE What changed:  See the new instructions.   Vitamin D 50 MCG (2000 UT) tablet Take 3 tablets (6,000 Units total) by mouth daily. What changed:  how much to take   XARELTO 20 MG Tabs tablet Generic drug:  rivaroxaban TAKE 1 TABLET BY MOUTH EVERY EVENING WITH SUPPER       Follow-up Information    Dennis Cowboy, MD. Schedule an appointment as soon as possible for a visit in 3 day(s).   Specialty:  Internal Medicine Contact information: [address] [place] [address] [phone]           Major procedures and Radiology Reports - PLEASE review detailed and final reports thoroughly  -       Dg Abd 1 View  Result  Date: 06/27/2018 CLINICAL DATA:  Encounter for orogastric tube placement. EXAM: ABDOMEN - 1 VIEW COMPARISON:  None. FINDINGS: Orogastric tube passes below  the diaphragm to curl well within the stomach. Normal bowel gas pattern. IMPRESSION: Well-positioned orogastric tube. Electronically Signed   By: [name] M.D.   On: 06/27/2018 12:43   Ct Soft Tissue Neck W Contrast  Result Date: 06/27/2018 CLINICAL DATA:  Awoke today with swelling of the tongue. EXAM: CT NECK WITH CONTRAST TECHNIQUE: Multidetector CT imaging of the neck was performed using the standard protocol following the bolus administration of intravenous contrast. CONTRAST:  39mL OMNIPAQUE IOHEXOL 300 MG/ML  SOLN COMPARISON:  01/31/2015 FINDINGS: Pharynx and larynx: Patient is intubated. There is an orogastric tube also in place. Patient does appear to have diffuse swelling of the tongue with edema in the soft tissue planes of the sublingual and submandibular spaces well. This could be due to angio edema or infection. No evidence of a focal abscess or focal lesion. Salivary glands: Parotid and submandibular glands transit plea normal. There is some regional swelling surrounding the submandibular glands. Thyroid: Normal Lymph nodes: No enlarged or low-density nodes on either side of the neck. Vascular: Atherosclerotic disease at both carotid bifurcations. No venous thrombosis. Limited intracranial: Old bilateral strokes as previously shown by head CT. Visualized orbits: Normal Mastoids and visualized paranasal sinuses: Mucosal inflammatory changes of the paranasal sinuses. Fluid levels in the maxillary sinuses consistent with maxillary sinusitis. Small sphenoid sinus air-fluid levels. Mastoids are clear. Skeleton: Ordinary cervical spondylosis. Upper chest: Clear Other: None IMPRESSION: Nonspecific soft tissue swelling of the tongue and edema within the sublingual and submandibular spaces. This is nonspecific and could be due to angio edema or  infectious inflammation. No evidence of a focal drainable collection. Electronically Signed   By: [name] M.D.   On: 06/27/2018 09:35   Dg Chest Port 1 View  Result Date: 06/29/2018 CLINICAL DATA:  Ventilator dependent respiratory failure. Angioedema. EXAM: PORTABLE CHEST 1 VIEW COMPARISON:  06/28/2018 and earlier, including CTA chest 01/01/2016. FINDINGS: Endotracheal tube tip in satisfactory position projecting approximately 6 cm above the carina. Nasogastric tube looped in the stomach with its tip in the fundus. Cardiac silhouette mildly to moderately enlarged for AP portable technique, unchanged. Airspace consolidation in the lower lobes, LEFT greater than RIGHT, worse than yesterday. No new pulmonary parenchymal abnormalities elsewhere. Mild pulmonary venous hypertension without overt edema. IMPRESSION: 1.  Support apparatus satisfactory. 2. Worsening bibasilar atelectasis and/or pneumonia since yesterday. 3. Stable cardiomegaly. Pulmonary venous hypertension without overt edema. Electronically Signed   By: Hulan Saas M.D.   On: 06/29/2018 07:51   Dg Chest Port 1 View  Result Date: 06/28/2018 CLINICAL DATA:  Respiratory failure EXAM: PORTABLE CHEST 1 VIEW COMPARISON:  Yesterday FINDINGS: Endotracheal tube tip at the clavicular heads. The orogastric tube tip is in the stomach. Improved aeration with better defined left diaphragm. Indistinct opacities at the bases on both sides. Chronic cardiomegaly with BB over the left heart. IMPRESSION: 1. Unremarkable hardware positioning. 2. Mild improvement in lower lobe airspace disease. Electronically Signed   By: Marnee Spring M.D.   On: 06/28/2018 07:37   Dg Chest Port 1 View  Result Date: 06/27/2018 CLINICAL DATA:  Intubation and orogastric tube placement EXAM: PORTABLE CHEST 1 VIEW COMPARISON:  01/01/2016 FINDINGS: Chronic cardiomegaly with vascular pedicle widening. Endotracheal tube tip at the clavicular heads. The orogastric tube reaches  the stomach. Low volume chest with hazy density at both bases. No Kerley lines, definite effusion, or pneumothorax. IMPRESSION: 1. Unremarkable hardware positioning. 2. Low volume chest with indistinct opacities at the bases that could be atelectasis, infection,  or aspiration. 3. Cardiomegaly Electronically Signed   By: Marnee Spring M.D.   On: 06/27/2018 04:49    Micro Results     Recent Results (from the past 240 hour(s))  MRSA PCR Screening     Status: None   Collection Time: 06/27/18 10:48 AM  Result Value Ref Range Status   MRSA by PCR NEGATIVE NEGATIVE Final    Comment:        The GeneXpert MRSA Assay (FDA approved for NASAL specimens only), is one component of a comprehensive MRSA colonization surveillance program. It is not intended to diagnose MRSA infection nor to guide or monitor treatment for MRSA infections. Performed at [facility], [address] [place], [place] [address]   Culture, Urine     Status: None   Collection Time: 06/28/18  1:30 PM  Result Value Ref Range Status   Specimen Description URINE, RANDOM  Final   Special Requests NONE  Final   Culture   Final    NO GROWTH Performed at [facility], [address], [place], [place] [address]    Report Status 06/29/2018 FINAL  Final  Culture, respiratory (non-expectorated)     Status: None (Preliminary result)   Collection Time: 06/28/18  1:30 PM  Result Value Ref Range Status   Specimen Description TRACHEAL ASPIRATE  Final   Special Requests NONE  Final   Gram Stain   Final    ABUNDANT WBC PRESENT, PREDOMINANTLY PMN ABUNDANT GRAM NEGATIVE RODS ABUNDANT GRAM POSITIVE COCCI FEW GRAM POSITIVE RODS RARE GRAM NEGATIVE COCCOBACILLI    Culture   Final    CULTURE REINCUBATED FOR BETTER GROWTH Performed at [facility], [address] [place], [place] [address]    Report Status PENDING  Incomplete  Culture, blood (Routine X 2) w Reflex to ID Panel     Status: None (Preliminary result)    Collection Time: 06/28/18  2:15 PM  Result Value Ref Range Status   Specimen Description BLOOD LEFT HAND  Final   Special Requests   Final    BOTTLES DRAWN AEROBIC AND ANAEROBIC Blood Culture adequate volume   Culture   Final    NO GROWTH 2 DAYS Performed at [facility], [address] [place], [place] [address]    Report Status PENDING  Incomplete  Culture, blood (Routine X 2) w Reflex to ID Panel     Status: None (Preliminary result)   Collection Time: 06/28/18  2:20 PM  Result Value Ref Range Status   Specimen Description BLOOD RIGHT HAND  Final   Special Requests   Final    BOTTLES DRAWN AEROBIC AND ANAEROBIC Blood Culture adequate volume   Culture   Final    NO GROWTH 2 DAYS Performed at [facility], [address], [place], [place] [address]    Report Status PENDING  Incomplete    Today   Subjective    Arshdeep Fruchey today has no headache,no chest abdominal pain,no new weakness tingling or numbness, feels much better wants to go home today.     Objective   Blood pressure (!) 143/67, pulse (!) 52, temperature 97.9 F (36.6 C), temperature source Oral, resp. rate 16, height 5\' 7"  (1.702 m), weight 67 kg, SpO2 99 %.   Intake/Output Summary (Last 24 hours) at 06/30/2018 0913 Last data filed at 06/30/2018 0541 Gross per 24 hour  Intake 817.88 ml  Output 850 ml  Net -32.12 ml  Exam Awake Alert,  No new F.N deficits, Normal affect [place].AT,PERRAL Supple Neck,No JVD, No cervical lymphadenopathy appriciated.  Symmetrical Chest wall movement, Good air movement bilaterally, CTAB RRR,No Gallops,Rubs or new Murmurs, No Parasternal Heave +ve B.Sounds, Abd Soft, Non tender, No organomegaly appriciated, No rebound -guarding or rigidity. No Cyanosis, Clubbing or edema, No new Rash or bruise   Data Review   CBC w Diff:  Lab Results  Component Value Date   WBC 11.1 (H) 06/30/2018   HGB 10.5 (L) 06/30/2018   HCT 31.0 (L) 06/30/2018   PLT 116 (L) 06/30/2018    LYMPHOPCT 22 12/13/2016   MONOPCT 9.4 04/22/2018   EOSPCT 2.5 04/22/2018   BASOPCT 0.3 04/22/2018    CMP:  Lab Results  Component Value Date   NA 144 06/29/2018   K 4.0 06/29/2018   CL 115 (H) 06/29/2018   CO2 25 06/29/2018   BUN 30 (H) 06/29/2018   CREATININE 1.48 (H) 06/29/2018   CREATININE 1.40 (H) 04/22/2018   PROT 6.9 06/27/2018   ALBUMIN 3.7 06/27/2018   BILITOT 0.7 06/27/2018   ALKPHOS 60 06/27/2018   AST 19 06/27/2018   ALT 12 06/27/2018  .   Total Time in preparing paper work, data evaluation and todays exam - 35 minutes  Susa RaringPrashant Edahi Kroening M.D on 06/30/2018 at 9:13 AM  [facility]   Office  [phone]
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
## 2018-06-30 NOTE — Evaluation (Addendum)
Occupational Therapy Evaluation Patient Details Name: Dennis [name] MRN: [id number] DOB: 1945-07-09 Today's Date: 06/30/2018    History of Present Illness Pt is 73 y.o. male admitted 06/27/18 with angioedema presumed d/t lisinopril, intubated 06/27/18-06/29/18,. Hx depression, dementia, CVA (2013) PE, HTN, HLD, CHF, CAD. CXR 06/29/18 showed worsening bibasilar atelectasis and/or pneumonia     Clinical Impression   PTA, pt was living with his significant other/caregiver who assists with BADLs; also has an aide who assists with ADLs. Pt reports uses a cane for functional mobility and was receiving HHPT. Pt performing ADLs and functional mobility with Min Guard-Min A and RW. Pt presenting with decreased strength and balance. Pt reporting he would like to continue HHPT and "get back to my exercises." Pt planning for dc later today. Recommend dc to home once medically stable per physician and resume HHPT.      Follow Up Recommendations  No OT follow up;Supervision/Assistance - 24 hour ; Continue HHPT   Equipment Recommendations  None recommended by OT    Recommendations for Other Services PT consult     Precautions / Restrictions Precautions Precautions: Fall      Mobility Bed Mobility Overal bed mobility: Needs Assistance Bed Mobility: Supine to Sit     Supine to sit: Min guard;HOB elevated     General bed mobility comments: Min GUard A for safety  Transfers Overall transfer level: Needs assistance Equipment used: Rolling walker (2 wheeled);1 person hand held assist Transfers: Sit to/from Stand Sit to Stand: Min assist         General transfer comment: Min A for power up during initial stand from EOB and pt presenting with decreased balance.     Balance Overall balance assessment: Needs assistance Sitting-balance support: No upper extremity supported;Feet supported Sitting balance-Leahy Scale: Fair     Standing balance support: Bilateral upper extremity supported;During  functional activity Standing balance-Leahy Scale: Fair                             ADL either performed or assessed with clinical judgement   ADL Overall ADL's : Needs assistance/impaired Eating/Feeding: Supervision/ safety;Set up;Sitting   Grooming: Oral care;Brushing hair;Min guard;Standing Grooming Details (indicate cue type and reason): Pt performing grooming tasks at sink with Min Guard A for safety. During bilateral coorindation tasks, Pt with posterior lean but no LOB. Pt requiring seated rest break after grooming Upper Body Bathing: Minimal assistance;Sitting   Lower Body Bathing: Minimal assistance;Sit to/from stand   Upper Body Dressing : Minimal assistance;Sitting   Lower Body Dressing: Minimal assistance;Sit to/from stand   Toilet Transfer: Minimal assistance;RW;Ambulation(simulated in room)           Functional mobility during ADLs: Minimal assistance;Rolling walker General ADL Comments: Pt presenting with decreased strength and balance. agreeable to therapy and stating "I am weak because I havent been doing my exercises."     Vision         Perception     Praxis      Pertinent Vitals/Pain Pain Assessment: No/denies pain     Hand Dominance Right   Extremity/Trunk Assessment Upper Extremity Assessment Upper Extremity Assessment: Generalized weakness   Lower Extremity Assessment Lower Extremity Assessment: Defer to PT evaluation;Generalized weakness   Cervical / Trunk Assessment Cervical / Trunk Assessment: Kyphotic   Communication Communication Communication: No difficulties   Cognition Arousal/Alertness: Awake/alert Behavior During Therapy: WFL for tasks assessed/performed Overall Cognitive Status: Within Functional Limits for tasks assessed  General Comments  Granddaughter present throughout session    Exercises     Shoulder Instructions      Home Living Family/patient  expects to be discharged to:: Private residence Living Arrangements: Spouse/significant other Available Help at Discharge: Family;Available 24 hours/day Type of Home: House Home Access: Stairs to enter [employer] of Steps: 2   Home Layout: One level     Bathroom Shower/Tub: Walk-in shower;Tub/shower unit   Bathroom Toilet: Standard     Home Equipment: Environmental consultant - 2 wheels;Cane - single point;Bedside commode;Shower seat   Additional Comments: Aide who comes a couple times a week. Had HHPT prior to admission      Prior Functioning/Environment Level of Independence: Needs assistance  Gait / Transfers Assistance Needed: Uses cane as he feels. SOmetimes uses walker ADL's / Homemaking Assistance Needed: Pt's granddaughter reporting his significant other and aide assists with ADLs as needed.            OT Problem List: Decreased activity tolerance;Decreased strength;Impaired balance (sitting and/or standing);Decreased knowledge of use of DME or AE;Decreased knowledge of precautions;Decreased safety awareness      OT Treatment/Interventions:      OT Goals(Current goals can be found in the care plan section) Acute Rehab OT Goals Patient Stated Goal: "Go home today" OT Goal Formulation: All assessment and education complete, DC therapy  OT Frequency:     Barriers to D/C:            Co-evaluation              AM-PAC OT "6 Clicks" Daily Activity     Outcome Measure Help from another person eating meals?: None Help from another person taking care of personal grooming?: A Little Help from another person toileting, which includes using toliet, bedpan, or urinal?: A Little Help from another person bathing (including washing, rinsing, drying)?: A Little Help from another person to put on and taking off regular upper body clothing?: A Little Help from another person to put on and taking off regular lower body clothing?: A Little 6 Click Score: 19   End of Session  Equipment Utilized During Treatment: Gait belt;Rolling walker Nurse Communication: Mobility status;Precautions  Activity Tolerance: Patient tolerated treatment well Patient left: in chair;with call bell/phone within reach;with family/visitor present(with PT)  OT Visit Diagnosis: Other abnormalities of gait and mobility (R26.89);Muscle weakness (generalized) (M62.81);Other symptoms and signs involving cognitive function                Time: 2671-2458 OT Time Calculation (min): 17 min Charges:  OT General Charges $OT Visit: 1 Visit OT Evaluation $OT Eval Moderate Complexity: 1 Mod  Ikia Cincotta MSOT, OTR/L Acute Rehab Pager: [phone] Office: [phone]   Theodoro Grist Anette Barra 06/30/2018, 10:27 AM

## 2018-06-30 NOTE — Evaluation (Addendum)
Physical Therapy Evaluation and Discharge Patient Details Name: Dennis [name] MRN: [id number] DOB: 10-04-1945 Today's Date: 06/30/2018   History of Present Illness  Pt is 73 y.o. male admitted 06/27/18 with angioedema presumed d/t lisinopril, intubated 06/27/18-06/29/18,. Hx depression, dementia, CVA (2013) PE, HTN, HLD, CHF, CAD. CXR 06/29/18 showed worsening bibasilar atelectasis and/or pneumonia    Clinical Impression  Patient evaluated by Physical Therapy with no further acute PT needs identified. Grandaughter present and reports pt has 24/7 assist by his girlfriend and also has an aide several days per week (through [employer]). Patient was able to ascend/descend 3 steps with min-guard to min assist. Per family and OT, plan is to discharge home today. See below for any follow-up Physical Therapy or equipment needs. PT is signing off. Thank you for this referral.     Follow Up Recommendations No PT follow up(has HH aide and 24/7 care; his agency does not provide HHPT) They prefer to use [facility] for home care needs    Equipment Recommendations  None recommended by PT    Recommendations for Other Services       Precautions / Restrictions Precautions Precautions: Fall      Mobility  Bed Mobility Overal bed mobility: Needs Assistance Bed Mobility: Supine to Sit     Supine to sit: Min guard;HOB elevated     General bed mobility comments: Min GUard A for safety  Transfers Overall transfer level: Needs assistance Equipment used: Rolling walker (2 wheeled);1 person hand held assist Transfers: Sit to/from Stand Sit to Stand: Min guard         General transfer comment: from chair with armrests no physical assist; close guard for safety  Ambulation/Gait Ambulation/Gait assistance: Min guard Gait Distance (Feet): 70 Feet Assistive device: Rolling walker (2 wheeled) Gait Pattern/deviations: Step-through pattern;Decreased stride length;Trunk flexed   Gait velocity  interpretation: <1.8 ft/sec, indicate of risk for recurrent falls General Gait Details: RLE shorter than LLE with slight "drop" when in stance on RLE  Stairs Stairs: Yes Stairs assistance: Min assist Stair Management: One rail Right;Forwards;Alternating pattern;Step to pattern(HHA on left as rails too far apart to reach both ) Number of Stairs: 3 General stair comments: pt did not feel safe turning around on 3rd step and came down steps backwards with min assist for safety  Wheelchair Mobility    Modified Rankin (Stroke Patients Only)       Balance Overall balance assessment: Needs assistance Sitting-balance support: No upper extremity supported;Feet supported Sitting balance-Leahy Scale: Fair     Standing balance support: During functional activity;Single extremity supported;No upper extremity supported Standing balance-Leahy Scale: Fair Standing balance comment: observed at sink with one hand support most of the time, but able to do bimanual task when needed without LOB                             Pertinent Vitals/Pain Pain Assessment: No/denies pain    Home Living Family/patient expects to be discharged to:: Private residence Living Arrangements: Spouse/significant other Available Help at Discharge: Family;Available 24 hours/day Type of Home: House Home Access: Stairs to enter Entrance Stairs-Rails: Can reach both;Right;Left Entrance Stairs-Number of Steps: 2 Home Layout: One level Home Equipment: Walker - 2 wheels;Cane - single point;Bedside commode;Shower seat Additional Comments: Aide who comes a couple times a week. Had HHPT prior to admission    Prior Function Level of Independence: Needs assistance   Gait / Transfers Assistance Needed: Uses cane as he  feels. SOmetimes uses walker  ADL's / Homemaking Assistance Needed: Pt's granddaughter reporting his significant other and aide assists with ADLs as needed.        Hand Dominance   Dominant  Hand: Right    Extremity/Trunk Assessment   Upper Extremity Assessment Upper Extremity Assessment: Defer to OT evaluation    Lower Extremity Assessment Lower Extremity Assessment: Generalized weakness    Cervical / Trunk Assessment Cervical / Trunk Assessment: Kyphotic  Communication   Communication: No difficulties  Cognition Arousal/Alertness: Awake/alert Behavior During Therapy: WFL for tasks assessed/performed Overall Cognitive Status: Within Functional Limits for tasks assessed                                        General Comments General comments (skin integrity, edema, etc.): Granddaughter present throughout session    Exercises     Assessment/Plan    PT Assessment All further PT needs can be met in the next venue of care  PT Problem List Decreased strength;Decreased activity tolerance;Decreased balance       PT Treatment Interventions      PT Goals (Current goals can be found in the Care Plan section)  Acute Rehab PT Goals Patient Stated Goal: "Go home today" PT Goal Formulation: All assessment and education complete, DC therapy    Frequency     Barriers to discharge        Co-evaluation               AM-PAC PT "6 Clicks" Mobility  Outcome Measure Help needed turning from your back to your side while in a flat bed without using bedrails?: None Help needed moving from lying on your back to sitting on the side of a flat bed without using bedrails?: A Little Help needed moving to and from a bed to a chair (including a wheelchair)?: A Little Help needed standing up from a chair using your arms (e.g., wheelchair or bedside chair)?: A Little Help needed to walk in hospital room?: A Little Help needed climbing 3-5 steps with a railing? : A Little 6 Click Score: 19    End of Session Equipment Utilized During Treatment: Gait belt Activity Tolerance: Patient limited by fatigue Patient left: in bed;with call bell/phone within  reach;with bed alarm set;with family/visitor present   PT Visit Diagnosis: Unsteadiness on feet (R26.81);Muscle weakness (generalized) (M62.81)    Time: 9735-3299 PT Time Calculation (min) (ACUTE ONLY): 17 min   Charges:   PT Evaluation $PT Eval Low Complexity: 1 Low            Galvin Proffer  [phone] 06/30/2018, 11:27 AM

## 2018-06-30 NOTE — Discharge Instructions (Signed)
Follow with Primary MD Lucky Cowboy, MD in 3 days   Get CBC, CMP, 2 view Chest X ray -  checked  by Primary MD  in 3 days    Activity: As tolerated with Full fall precautions use walker/cane & assistance as needed  Disposition Home    Diet: Heart Healthy - do not consume shellfish or related products.  Special Instructions: If you have smoked or chewed Tobacco  in the last 2 yrs please stop smoking, stop any regular Alcohol  and or any Recreational drug use.  On your next visit with your primary care physician please Get Medicines reviewed and adjusted.  Please request your Prim.MD to go over all Hospital Tests and Procedure/Radiological results at the follow up, please get all Hospital records sent to your Prim MD by signing hospital release before you go home.  If you experience worsening of your admission symptoms, develop shortness of breath, life threatening emergency, suicidal or homicidal thoughts you must seek medical attention immediately by calling 911 or calling your MD immediately  if symptoms less severe.  You Must read complete instructions/literature along with all the possible adverse reactions/side effects for all the Medicines you take and that have been prescribed to you. Take any new Medicines after you have completely understood and accpet all the possible adverse reactions/side effects.   Information on my medicine - XARELTO (rivaroxaban)  WHY WAS XARELTO PRESCRIBED FOR YOU? Xarelto was prescribed to treat blood clots that may have been found in the veins of your legs (deep vein thrombosis) or in your lungs (pulmonary embolism) and to reduce the risk of them occurring again.  What do you need to know about Xarelto? The dose is one 20 mg tablet taken ONCE A DAY with your evening meal.  DO NOT stop taking Xarelto without talking to the health care provider who prescribed the medication.  Refill your prescription for 20 mg tablets before you run out.  After  discharge, you should have regular check-up appointments with your healthcare provider that is prescribing your Xarelto.  In the future your dose may need to be changed if your kidney function changes by a significant amount.  What do you do if you miss a dose? If you are taking Xarelto ONCE DAILY and you miss a dose, take it as soon as you remember on the same day then continue your regularly scheduled once daily regimen the next day. Do not take two doses of Xarelto at the same time.   Important Safety Information Xarelto is a blood thinner medicine that can cause bleeding. You should call your healthcare provider right away if you experience any of the following: ? Bleeding from an injury or your nose that does not stop. ? Unusual colored urine (red or dark brown) or unusual colored stools (red or black). ? Unusual bruising for unknown reasons. ? A serious fall or if you hit your head (even if there is no bleeding).  Some medicines may interact with Xarelto and might increase your risk of bleeding while on Xarelto. To help avoid this, consult your healthcare provider or pharmacist prior to using any new prescription or non-prescription medications, including herbals, vitamins, non-steroidal anti-inflammatory drugs (NSAIDs) and supplements.  This website has more information on Xarelto: VisitDestination.com.br.

## 2018-06-30 NOTE — Progress Notes (Signed)
ANTICOAGULATION CONSULT NOTE  Pharmacy Consult:  Heparin Indication: h/o VTE  Allergies  Allergen Reactions  . Lisinopril Other (See Comments)    angioedema    Patient Measurements: Height: 5\' 7"  (170.2 cm) Weight: 147 lb 11.3 oz (67 kg) IBW/kg (Calculated) : 66.1  Heparin dosing weight = 64 kg  Vital Signs: Temp: 97.9 F (36.6 C) (02/16 0527) Temp Source: Oral (02/16 0527) BP: 143/67 (02/16 0527) Pulse Rate: 52 (02/16 0527)  Labs: Recent Labs    06/27/18 1615  06/28/18 0227 06/28/18 0352 06/28/18 1421 06/29/18 0444 06/30/18 0407  HGB  --    < > 11.8* 10.9*  --  10.5* 10.5*  HCT  --    < > 35.2* 32.0*  --  31.3* 31.0*  PLT  --   --  121*  --   --  103* 116*  APTT 68*  --  105*  --   --   --   --   HEPARINUNFRC 0.31  --  0.57  --   --  0.51 0.27*  CREATININE  --   --  1.36*  --   --  1.48*  --   TROPONINI  --   --   --   --  <0.03 <0.03  --    < > = values in this interval not displayed.     Assessment: 87 YOM presented with angioedema, intubated and transitioned from Xarelto to IV heparin for history of PE.  Patient couldn't state when he last took Xarelto and family was not present. Heparin level and aPTT are now correlating.   Heparin level slightly subtherapeutic this morning at 0.27. Patient had previously been therapeutic on 950 units/mL. Patient is being discharged today and resuming Xarelto, so will not order heparin level recheck or increase drip rate. Hgb is stable and plts slightly improved to 116 today.   Goal of Therapy:  Heparin level 0.3-0.7 units/ml Monitor platelets by anticoagulation protocol: Yes   Plan:  Continue heparin at 950 units/hr Resume Xarelto on discharge  Arvilla Market, PharmD PGY1 Pharmacy Resident Phone [phone] 06/30/2018     9:48 AM

## 2018-06-30 NOTE — Progress Notes (Signed)
Spoke w patient, he states that he has a PCS 8 hours everyday and an aid through [employer]. He states he has a cane and a RW, that he only needs occasionally. He denies other [facility] services or needs for additional [facility] services.

## 2018-07-01 NOTE — Progress Notes (Signed)
Hospital follow up  Assessment and Plan: Hospital visit follow up for sudden onset of angioedema.    Dennis [name] was seen today for hospitalization follow-up.  Diagnoses and all orders for this visit:  ACE inhibitor-aggravated angioedema, sequela -Had hospital admission ICU with intubation Lisinopril (ACE) Discontinued Stable on room air Finishing Medrol dose pack  Essential hypertension -     CBC with Differential/Platelet -     COMPLETE METABOLIC PANEL WITH GFR -Continue     carvedilol (COREG) 25 MG tablet; Take 0.5 tablets (12.5 mg total) by mouth 2 (two) times daily with a meal. -Continue Norvasc 10mg  daily  -Also taking Lasix 40mg  Consider Imdur if pressure increases  Cardiomegaly Noted on previous x-rays -     DG Chest 2 View; Future  Atelectasis of both lungs Re-check for resolution -     DG Chest 2 View; Future  Cough -     DG Chest 2 View; Future Also check CBC & CMP for bacterial involvement WBC (10) (15) 11.1 at discharge. From intubation? Atelectasis noted xrays, discussed deep breathing and coughing exercises.  Hyperlipademia, mixed Continue Pravachol 20mg  at bedtime Tolerating well Will monitor lipids routinely  Chronic combined systolic and diastolic congestive heart failure (HCC) CHF Disease process and medications discussed. Questions answered fully. Emphasized salt restriction, less than 2000mg  a day. Encouraged daily monitoring of the patient's weight, call office if 5 lb weight loss or gain in a day.  Encouraged regular exercise. If any increasing shortness of breath, swelling, or chest pressure go to ER immediately.  decrease your fluid intake to less than 2 L daily Not taking potassium at this time, will continue to monitor.  Consider Imdur as noted above   Activity: As tolerated with Full fall precautions use walker/cane & assistance as needed    All medications were reviewed with patient and family and fully reconciled. All questions  answered fully, and patient and family members were encouraged to call the office with any further questions or concerns. Discussed goal to avoid readmission related to this diagnosis.   DISCONTINUED MEDICATIONS: Lisinopril, added to allergy list, no ACE inhibitors   Over 40 minutes of exam, counseling, chart review, and complex, high/moderate level critical decision making was performed this visit.   Future Appointments  Date Time Provider Department Center  07/23/2018  2:30 PM [name], [name], MD [facility] None  02/20/2019  3:00 PM [name], [name], MD [facility] None     HPI 73 y.o.male presents for follow up for transition from recent hospitalization. Ad[facility] to [name] with sudden onset on tongue swelling that woke him from his sleep. No unusual symptoms prior to this.  After evaluation angioedma was noted and his airway needed to be secure so he was intubated.  He received 125mg  Solumedrol, 20mg  Famotidine, 25mg  Benadryl, 1g TXA prior to this. patient was discharged from the hospital on 06/30/18 and our clinical staff contacted the office the day after discharge to set up a follow up appointment. The discharge summary, medications, and diagnostic test results were reviewed before meeting with the patient.   Home health is not involved.   Reports he has been doing well since his hospital admission.,  He reports a dry non-productive cough that is intermittent.  Denies sputum production of fevers.  No change in this related to time of day or positioning.  Mild shortness of breath with exertion. No other accompanying symptoms.  Denies any weight gain, monitors this at home.  Blood pressure at goal for patient today.  Discussed monitoring this and goal 140/90 or less.    HTN predates circa 2000.    Images while in the hospital: Dg Abd 1 View  Result Date: 06/27/2018 CLINICAL DATA:  Encounter for orogastric tube placement. EXAM: ABDOMEN - 1 VIEW  COMPARISON:  None. FINDINGS: Orogastric tube passes below the diaphragm to curl well within the stomach. Normal bowel gas pattern. IMPRESSION: Well-positioned orogastric tube. Electronically Signed   By: [name] M.D.   On: 06/27/2018 12:43   Ct Soft Tissue Neck W Contrast  Result Date: 06/27/2018 CLINICAL DATA:  Awoke today with swelling of the tongue. EXAM: CT NECK WITH CONTRAST TECHNIQUE: Multidetector CT imaging of the neck was performed using the standard protocol following the bolus administration of intravenous contrast. CONTRAST:  75mL OMNIPAQUE IOHEXOL 300 MG/ML  SOLN COMPARISON:  01/31/2015 FINDINGS: Pharynx and larynx: Patient is intubated. There is an orogastric tube also in place. Patient does appear to have diffuse swelling of the tongue with edema in the soft tissue planes of the sublingual and submandibular spaces well. This could be due to angio edema or infection. No evidence of a focal abscess or focal lesion. Salivary glands: Parotid and submandibular glands transit plea normal. There is some regional swelling surrounding the submandibular glands. Thyroid: Normal Lymph nodes: No enlarged or low-density nodes on either side of the neck. Vascular: Atherosclerotic disease at both carotid bifurcations. No venous thrombosis. Limited intracranial: Old bilateral strokes as previously shown by head CT. Visualized orbits: Normal Mastoids and visualized paranasal sinuses: Mucosal inflammatory changes of the paranasal sinuses. Fluid levels in the maxillary sinuses consistent with maxillary sinusitis. Small sphenoid sinus air-fluid levels. Mastoids are clear. Skeleton: Ordinary cervical spondylosis. Upper chest: Clear Other: None IMPRESSION: Nonspecific soft tissue swelling of the tongue and edema within the sublingual and submandibular spaces. This is nonspecific and could be due to angio edema or infectious inflammation. No evidence of a focal drainable collection. Electronically Signed   By: [name] M.D.   On: 06/27/2018 09:35   Dg Chest Port 1 View  Result Date: 06/28/2018 CLINICAL DATA:  Respiratory failure EXAM: PORTABLE CHEST 1 VIEW COMPARISON:  Yesterday FINDINGS: Endotracheal tube tip at the clavicular heads. The orogastric tube tip is in the stomach. Improved aeration with better defined left diaphragm. Indistinct opacities at the bases on both sides. Chronic cardiomegaly with BB over the left heart. IMPRESSION: 1. Unremarkable hardware positioning. 2. Mild improvement in lower lobe airspace disease. Electronically Signed   By: Marnee Spring M.D.   On: 06/28/2018 07:37   Dg Chest Port 1 View  Result Date: 06/27/2018 CLINICAL DATA:  Intubation and orogastric tube placement EXAM: PORTABLE CHEST 1 VIEW COMPARISON:  01/01/2016 FINDINGS: Chronic cardiomegaly with vascular pedicle widening. Endotracheal tube tip at the clavicular heads. The orogastric tube reaches the stomach. Low volume chest with hazy density at both bases. No Kerley lines, definite effusion, or pneumothorax. IMPRESSION: 1. Unremarkable hardware positioning. 2. Low volume chest with indistinct opacities at the bases that could be atelectasis, infection, or aspiration. 3. Cardiomegaly Electronically Signed   By: Marnee Spring M.D.   On: 06/27/2018 04:49     Current Outpatient Medications (Endocrine & Metabolic):  .  methylPREDNISolone (MEDROL DOSEPAK) 4 MG TBPK tablet, follow package directions  Current Outpatient Medications (Cardiovascular):  .  amLODipine (NORVASC) 10 MG tablet, Take 10 mg by mouth daily. .  carvedilol (COREG) 25 MG tablet, TAKE 1 TABLET 2 X/ DAY  FOR BP & HEART (Patient taking differently: Take 12.5 mg by mouth 2 (two) times daily with a meal. ) .  furosemide (LASIX) 40 MG tablet, Take 40 mg by mouth daily. .  pravastatin (PRAVACHOL) 20 MG tablet, TAKE 1 TABLET AT BEDTIME (Patient taking differently: Take 20 mg by mouth at bedtime. )    Current Outpatient Medications (Hematological):  Marland Kitchen   XARELTO 20 MG TABS tablet, TAKE 1 TABLET BY MOUTH EVERY EVENING WITH SUPPER (Patient not taking: Reported on 06/28/2018)  Current Outpatient Medications (Other):  Marland Kitchen  Cholecalciferol (VITAMIN D) 50 MCG (2000 UT) tablet, Take 3 tablets (6,000 Units total) by mouth daily. (Patient taking differently: Take 4,000 Units by mouth daily. ) .  donepezil (ARICEPT) 5 MG tablet, TAKE 1 TABLET DAILY (Patient taking differently: Take 5 mg by mouth daily. ) .  sertraline (ZOLOFT) 50 MG tablet, TAKE 1 TABLET DAILY FOR MOOD (Patient taking differently: Take 50 mg by mouth daily. ) .  tamsulosin (FLOMAX) 0.4 MG CAPS capsule, TAKE 1 CAPSULE DAILY AT BEDTIME FOR PROSTATE (Patient taking differently: Take 0.4 mg by mouth at bedtime. )  Past Medical History:  Diagnosis Date  . CAD (coronary artery disease), native coronary artery    Coronary calcifications noted on CT scan.   . Cardiomyopathy of undetermined type (HCC)   . Chronic systolic heart failure (HCC)    Echo 2015 EF 30-35%   . CVA (cerebral vascular accident) [facility]) June 2013  . Embedded metal fragments    Metallic Pellet In Heart  . Gout   . History of ischemic vertebrobasilar artery cerebellar stroke   . Hyperlipemia   . Hyperlipidemia   . Hypertension   . Hypertensive heart disease   . Illiteracy   . Pre-diabetes   . Pulmonary embolism (HCC) 01/01/2016  . Vitamin D deficiency      Allergies  Allergen Reactions  . Lisinopril Other (See Comments)    angioedema    ROS: Review of Systems  Constitutional: Negative for chills, diaphoresis, fever, malaise/fatigue and weight loss.  HENT: Negative for congestion, ear discharge, ear pain, hearing loss, nosebleeds, sinus pain, sore throat and tinnitus.   Eyes: Negative for blurred vision, double vision, photophobia, pain, discharge and redness.  Respiratory: Positive for cough and shortness of breath. Negative for hemoptysis, sputum production, wheezing and stridor.   Cardiovascular: Negative for  chest pain, palpitations, orthopnea, claudication, leg swelling and PND.  Gastrointestinal: Negative for abdominal pain, blood in stool, constipation, diarrhea, heartburn, melena, nausea and vomiting.  Genitourinary: Negative for dysuria, flank pain, frequency, hematuria and urgency.  Musculoskeletal: Negative for back pain, falls, joint pain, myalgias and neck pain.  Skin: Negative for itching and rash.  Neurological: Negative for dizziness, tingling, tremors, sensory change, speech change, focal weakness, seizures, loss of consciousness, weakness and headaches.  Endo/Heme/Allergies: Negative for environmental allergies and polydipsia. Does not bruise/bleed easily.  Psychiatric/Behavioral: Negative for depression, hallucinations, memory loss, substance abuse and suicidal ideas. The patient is not nervous/anxious and does not have insomnia.      Physical Exam: Filed Weights   07/02/18 1059  Weight: 154 lb 12.8 oz (70.2 kg)   BP 130/82   Pulse 61   Temp (!) 97.3 F (36.3 C)   Ht 5' 6.5" (1.689 m)   Wt 154 lb 12.8 oz (70.2 kg)   SpO2 99%   BMI 24.61 kg/m  General Appearance: Well nourished, in no apparent distress. Eyes: PERRLA, EOMs, conjunctiva no swelling or erythema Sinuses: No Frontal/maxillary tenderness  ENT/Mouth: Ext aud canals clear, TMs without erythema, bulging. No erythema, swelling, or exudate on post pharynx.  Tonsils not swollen or erythematous. Hearing normal.  Neck: Supple, thyroid normal.  Respiratory: Respiratory effort normal, BS equal bilaterally without rales, rhonchi, wheezing or stridor.  Cardio: RRR with no MRGs. Brisk peripheral pulses without edema.  Abdomen: Soft, + BS.  Non tender, no guarding, rebound, hernias, masses. Lymphatics: Non tender without lymphadenopathy.  Musculoskeletal: Full ROM, 5/5 strength, normal gait.  Skin: Warm, dry without rashes, lesions, ecchymosis.  Neuro: Cranial nerves intact. Normal muscle tone, no cerebellar symptoms.  Sensation intact.  Psych: Awake and oriented X 3, normal affect, Insight and Judgment appropriate.     Elder Negus, NP 11:30 AM [facility]

## 2018-07-02 ENCOUNTER — Ambulatory Visit (HOSPITAL_COMMUNITY)
Admission: RE | Admit: 2018-07-02 | Discharge: 2018-07-02 | Disposition: A | Discharge status: Home / Self Care | Payer: Medicare HMO | Source: Ambulatory Visit | Attending: Adult Health Nurse Practitioner | Admitting: Adult Health Nurse Practitioner

## 2018-07-02 ENCOUNTER — Ambulatory Visit (INDEPENDENT_AMBULATORY_CARE_PROVIDER_SITE_OTHER): Payer: Medicare HMO | Admitting: Adult Health Nurse Practitioner

## 2018-07-02 ENCOUNTER — Encounter: Payer: Self-pay | Admitting: Adult Health Nurse Practitioner

## 2018-07-02 VITALS — BP 130/82 | HR 61 | Temp 97.3°F | Ht 66.5 in | Wt 154.8 lb

## 2018-07-02 DIAGNOSIS — I5042 Chronic combined systolic (congestive) and diastolic (congestive) heart failure: Secondary | ICD-10-CM

## 2018-07-02 DIAGNOSIS — I10 Essential (primary) hypertension: Secondary | ICD-10-CM | POA: Diagnosis not present

## 2018-07-02 DIAGNOSIS — I517 Cardiomegaly: Secondary | ICD-10-CM | POA: Diagnosis present

## 2018-07-02 DIAGNOSIS — I2510 Atherosclerotic heart disease of native coronary artery without angina pectoris: Secondary | ICD-10-CM

## 2018-07-02 DIAGNOSIS — T464X5S Adverse effect of angiotensin-converting-enzyme inhibitors, sequela: Secondary | ICD-10-CM

## 2018-07-02 DIAGNOSIS — J9811 Atelectasis: Secondary | ICD-10-CM

## 2018-07-02 DIAGNOSIS — R05 Cough: Secondary | ICD-10-CM | POA: Diagnosis present

## 2018-07-02 DIAGNOSIS — I429 Cardiomyopathy, unspecified: Secondary | ICD-10-CM | POA: Diagnosis not present

## 2018-07-02 DIAGNOSIS — E782 Mixed hyperlipidemia: Secondary | ICD-10-CM

## 2018-07-02 DIAGNOSIS — R059 Cough, unspecified: Secondary | ICD-10-CM

## 2018-07-02 DIAGNOSIS — T783XXS Angioneurotic edema, sequela: Principal | ICD-10-CM

## 2018-07-02 MED ORDER — CARVEDILOL 25 MG PO TABS: 12.5000 mg | ORAL_TABLET | Freq: Two times a day (BID) | ORAL | 1 refills | Status: DC

## 2018-07-02 NOTE — Patient Instructions (Addendum)
We are going to check labs today.  We will contact you in 1-3 days with the results.    Please go to [facility], Radiology department for outpatient Chest X-ray.  Please monitor your blood pressure twice a day.  It is best to do this first thing in the morning and then in the evening.  If your morning blood pressure is above 140/90.  Check again 30-60min after taking your blood pressure medication.  Write this down and bring to your next appointment.    If your blood pressure is consistently 140/90 or higher please contact office to change your medications.  Finishing taking the Methylprednisolone taper pack as discussed in the appointment.  STOP taking ALL LISINIOPRIL and make sure this has been removed from the home.  This has been added to your allergy list.   Next appointment is 3/10 with Dr Oneta Rack at 2:30pm.   Call or return with new or worsening symptoms as discussed in appointment.  May contact via office phone [phone].

## 2018-07-03 LAB — COMPLETE METABOLIC PANEL WITH GFR
ALT: 9 U/L (ref 9–46)
AST: 13 U/L (ref 10–35)
Albumin: 3.2 g/dL — ABNORMAL LOW (ref 3.6–5.1)
Alkaline phosphatase (APISO): 48 U/L (ref 35–144)
BUN/Creatinine Ratio: 18 (calc) (ref 6–22)
BUN: 22 mg/dL (ref 7–25)
CO2: 29 mmol/L (ref 20–32)
Calcium: 9.1 mg/dL (ref 8.6–10.3)
Chloride: 103 mmol/L (ref 98–110)
Creat: 1.25 mg/dL — ABNORMAL HIGH (ref 0.70–1.18)
GFR, Est African American: 67 mL/min/{1.73_m2} (ref >=60)
GFR, Est Non African American: 58 mL/min/{1.73_m2} — ABNORMAL LOW (ref >=60)
Globulin: 2.6 g/dL (calc) (ref 1.9–3.7)
Glucose, Bld: 107 mg/dL — ABNORMAL HIGH (ref 65–99)
Potassium: 3.9 mmol/L (ref 3.5–5.3)
Sodium: 140 mmol/L (ref 135–146)
Total Protein: 5.8 g/dL — ABNORMAL LOW (ref 6.1–8.1)

## 2018-07-03 LAB — CBC WITH DIFFERENTIAL/PLATELET
Absolute Monocytes: 1246 cells/uL — ABNORMAL HIGH (ref 200–950)
Basophils Absolute: 14 cells/uL (ref 0–200)
Basophils Relative: 0.1 %
Eosinophils Absolute: 140 {cells}/uL (ref 15–500)
Eosinophils Relative: 1 %
HCT: 35.6 % — ABNORMAL LOW (ref 38.5–50.0)
Hemoglobin: 12.3 g/dL — ABNORMAL LOW (ref 13.2–17.1)
Lymphs Abs: 1134 cells/uL (ref 850–3900)
MCH: 32.4 pg (ref 27.0–33.0)
MCHC: 34.6 g/dL (ref 32.0–36.0)
MCV: 93.7 fL (ref 80.0–100.0)
MPV: 10.7 fL (ref 7.5–12.5)
Monocytes Relative: 8.9 %
Neutro Abs: 11466 cells/uL — ABNORMAL HIGH (ref 1500–7800)
Neutrophils Relative %: 81.9 %
Platelets: 151 10*3/uL (ref 140–400)
RBC: 3.8 10*6/uL — ABNORMAL LOW (ref 4.20–5.80)
RDW: 12.8 % (ref 11.0–15.0)
Total Lymphocyte: 8.1 %
WBC: 14 10*3/uL — ABNORMAL HIGH (ref 3.8–10.8)

## 2018-07-03 LAB — CULTURE, BLOOD (ROUTINE X 2)
Culture: NO GROWTH
Culture: NO GROWTH
Special Requests: ADEQUATE
Special Requests: ADEQUATE

## 2018-07-03 LAB — COMPLETE METABOLIC PANEL WITHOUT GFR
AG Ratio: 1.2 (calc) (ref 1.0–2.5)
Total Bilirubin: 0.5 mg/dL (ref 0.2–1.2)

## 2018-07-04 ENCOUNTER — Other Ambulatory Visit: Payer: Self-pay | Admitting: Adult Health Nurse Practitioner

## 2018-07-04 ENCOUNTER — Other Ambulatory Visit: Payer: Self-pay | Admitting: *Deleted

## 2018-07-04 DIAGNOSIS — F03A0 Unspecified dementia, mild, without behavioral disturbance, psychotic disturbance, mood disturbance, and anxiety: Secondary | ICD-10-CM

## 2018-07-04 DIAGNOSIS — E782 Mixed hyperlipidemia: Secondary | ICD-10-CM

## 2018-07-04 DIAGNOSIS — N401 Enlarged prostate with lower urinary tract symptoms: Secondary | ICD-10-CM

## 2018-07-04 DIAGNOSIS — J189 Pneumonia, unspecified organism: Secondary | ICD-10-CM

## 2018-07-04 DIAGNOSIS — D72829 Elevated white blood cell count, unspecified: Secondary | ICD-10-CM

## 2018-07-04 DIAGNOSIS — Z79899 Other long term (current) drug therapy: Secondary | ICD-10-CM

## 2018-07-04 DIAGNOSIS — F0390 Unspecified dementia without behavioral disturbance: Secondary | ICD-10-CM

## 2018-07-04 DIAGNOSIS — D6859 Other primary thrombophilia: Secondary | ICD-10-CM

## 2018-07-04 MED ORDER — TAMSULOSIN HCL 0.4 MG PO CAPS: 0.4000 mg | ORAL_CAPSULE | Freq: Every day | ORAL | 1 refills | Status: DC

## 2018-07-04 MED ORDER — VITAMIN D 50 MCG (2000 UT) PO TABS: 4000.0000 [IU] | ORAL_TABLET | Freq: Every day | ORAL | Status: DC

## 2018-07-04 MED ORDER — PRAVASTATIN SODIUM 20 MG PO TABS: 20.0000 mg | ORAL_TABLET | Freq: Every day | ORAL | 1 refills | Status: DC

## 2018-07-04 MED ORDER — AMLODIPINE BESYLATE 10 MG PO TABS: 10.0000 mg | ORAL_TABLET | Freq: Every day | ORAL | 1 refills | Status: DC

## 2018-07-04 MED ORDER — RIVAROXABAN 20 MG PO TABS: 20.0000 mg | ORAL_TABLET | Freq: Every evening | ORAL | 1 refills | Status: DC

## 2018-07-04 MED ORDER — SERTRALINE HCL 50 MG PO TABS: 50.0000 mg | ORAL_TABLET | Freq: Every day | ORAL | 1 refills | Status: DC

## 2018-07-04 MED ORDER — DOXYCYCLINE HYCLATE 100 MG PO TABS: 100.0000 mg | ORAL_TABLET | Freq: Two times a day (BID) | ORAL | 0 refills | Status: AC

## 2018-07-04 MED ORDER — DONEPEZIL HCL 5 MG PO TABS: 5.0000 mg | ORAL_TABLET | Freq: Every day | ORAL | 1 refills | Status: DC

## 2018-07-04 NOTE — Patient Outreach (Signed)
[facility]) Care Management  07/04/2018  JEKHI MCNIVEN May 29, 1945 [id number]   Transition of Care Referral   Referral Date: 07/02/2018 Referral Source:  [facility] inpatient referral  Date of Admission: 06/27/2018 Diagnosis: ANGIONEUROTIC EDEMA INITIAL  Date of Discharge:Facility:  [facility] on 06/30/18 Insurance: [employer]   Outreach attempt # 1 No answer. [facility] RN CM left HIPAA compliant voicemail message along with CM's contact info.   Plan: [facility] RN CM sent an unsuccessful outreach letter and scheduled this patient for another call attempt within 4 business days  Kimberly L. Noelle Penner, RN, BSN, CCM [facility] Telephonic Care Management Care Coordinator Direct Number [phone] Mobile number [phone]  Main [facility] number [phone] Fax number [phone]

## 2018-07-05 ENCOUNTER — Ambulatory Visit: Payer: Self-pay | Admitting: *Deleted

## 2018-07-08 ENCOUNTER — Encounter: Payer: Self-pay | Admitting: Adult Health Nurse Practitioner

## 2018-07-08 ENCOUNTER — Telehealth: Payer: Self-pay

## 2018-07-08 NOTE — Telephone Encounter (Signed)
Jessica from Dr.Johnson's office requesting how long patient needs to be off blood thinner before extractions. Please fax information to [phone]

## 2018-07-08 NOTE — Telephone Encounter (Signed)
XARELTO should be stopped at least 24 hours before the procedure to reduce the risk of bleeding. XARELTO should be restarted after the surgical or other procedures as soon as adequate hemostasis has been established, the day of the procedure. IF you feel that they can do the extraction WITHOUT taking him off his blood thinner that would be preferred.

## 2018-07-09 ENCOUNTER — Other Ambulatory Visit: Payer: Self-pay | Admitting: *Deleted

## 2018-07-09 NOTE — Patient Outreach (Signed)
[facility]) Care Management  07/09/2018  REZA GRZESIK [birth year] [id number]   Transition of Care Referral  Referral Date: 07/02/2018 Referral Source:  [facility] inpatient referral  Date of Admission: 06/27/2018 Diagnosis: ANGIONEUROTIC EDEMA INITIAL  Date of Discharge:[facility]  [facility] on 06/30/18 Insurance: [employer]   Outreach attempt # 2 No answer. [facility] RN CM left HIPAA compliant voicemail message along with CM's contact info.   Plan: [facility] RN CM scheduled this patient for another call attempt within 4 business days  Jakell Trusty L. Noelle Penner, RN, BSN, CCM [facility] Telephonic Care Management Care Coordinator Office number [phone] Mobile number [phone]  Main [facility] number [phone] Fax number [phone]

## 2018-07-09 NOTE — Telephone Encounter (Signed)
Faxed instructions to Dr. Henriette Combs office

## 2018-07-11 ENCOUNTER — Other Ambulatory Visit: Payer: Self-pay | Admitting: *Deleted

## 2018-07-11 NOTE — Patient Outreach (Signed)
[facility]) Care Management  07/11/2018  POWER ARNET February 24, 1946 [id number]   Transition of Care Referral  Referral Date: 07/02/2018 Referral Source:  [facility] inpatient referral  Date of Admission: 06/27/2018 Diagnosis: ANGIONEUROTIC EDEMA INITIAL  Date of Discharge:[facility] on 06/30/18 Insurance: [employer]   Outreach attempt # 3 mobile  No answer. [facility] RN CM left HIPAA compliant voicemail message along with CM's contact info.   Plan: [facility] RN CM scheduled this patient for case closure within 7 business days  Jashad Depaula L. Noelle Penner, RN, BSN, CCM [facility] Telephonic Care Management Care Coordinator Direct Number [phone] Mobile number [phone]  Main [facility] number [phone] Fax number [phone]

## 2018-07-12 ENCOUNTER — Other Ambulatory Visit: Payer: Self-pay | Admitting: *Deleted

## 2018-07-12 NOTE — Patient Outreach (Signed)
[facility]) Care Management  07/12/2018  Dennis [name] 05-Jan-1946 [id number]   Transition of Care Referral  Referral Date:07/02/2018 Referral Source:[employer] inpatient referral  Date of Admission:06/27/2018 Diagnosis:ANGIONEUROTIC EDEMA INITIAL Date of Discharge:[facility]ility:[facility] on 06/30/18 Insurance:[employer] medicare  Dennis [name] [phone])returned a call to [facility] RN CM in regards to Dennis [name] who she cares for and has mild dementia She is able to verify HIPAA Reviewed and addressed EMMI red alert/referral to [facility] with patient  Dennis [name] confirms Dennis [name] is doing well at home and is followed by [facility] health care providers but is in need of a BP cuff and scale for the recommended weight and BP check q am per Primary MD staff. The NP recommended a goal of 140/90 or less  [facility] RN CM discussed the importance of management of these values also related to his CHF and pulmonary HTN. CM reviewed the need to check the weight to prevent increases of edema that may lead to CHF exacerbation s/s. Discussed a weight preferably q am after a void. Dennis [name] voiced understanding and reports not having a BP cuff to check the BP in the am and pm. She reports it would be tasking to attempt get Dennis [name] out of the home to go to the nearest drug store for a BP check. CM discussed checking with [facility] CMA to have a BP cuff sent to the home. CM discussed a possible charge and Dennis [name] reports that the payee, Dennis [name] would pay for a BP cuff if needed.    Social: Dennis [name] is divorced, has been adjudicated as a ward of the State/DSS and living with Dennis [name], who is assisting in his care at the home. He has also Personal care services [facility]) 8 hours a day, a Teacher, adult education (a couple times a week), granddaughter and Dennis [name], Payee as support. Other family members not involved in his care He is  independent/assist with his care needs. 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 (allergy-tongue swelling), Chronic systolic heart failure, pulmonary HTN, mild dementia, HTN, hx of ischemic vertebrobasilar artery cerebellar stroke, BPH, vitamin D deficiency, gout, mixed HDL, noncompliance, generalized anxiety disorder, hx of DVT, hx of pulmonary embolus, depression   DME.: scales, 2 wheel walker, single prong cane, bedside commode, shower seat   Medications: She denies concerns with taking medications as prescribed, affording medications, side effects of medications and questions about medications  Appointments: She confirms he was seen by Dr Kathryne Sharper NP, Elder Negus,  on 07/02/18 and is scheduled for a follow up on 07/23/18 a 1430    Advance Directives: She states Dennis [name] has a payee, Dennis [name] [phone] (Epic updated, Dennis [name], SW is no longer available)    Consent: [facility] RN CM reviewed [facility] services with patient. Patient gave verbal consent for services.   Plans  [facility] RN CM will assist by requesting patient be sent scales with assist of [facility] CMA  [facility] RN CM will send via mail education information for adult HF,  pulmonary HTN in adults and Heart failure:keeping track of your weight each day to assist Dennis [name] in the home management of Dennis Ngo   [facility] RN CM will follow up within 7-10 business days to see if Dennis Harrision has received the scales and EMMI materials.   Pt encouraged to return a call to [facility]  RN CM prn  Cala Bradford L. Noelle Penner, RN, BSN, CCM [facility] Telephonic Care Management Care Coordinator Office number [phone] Mobile number [phone]  Main [facility] number [phone] Fax number [phone]

## 2018-07-18 ENCOUNTER — Telehealth: Payer: Self-pay

## 2018-07-18 NOTE — Telephone Encounter (Signed)
LVM for return call in order to ascertain if he would like our office to re-send the cologuard test due to it being in active status/exceeded 365 days

## 2018-07-19 ENCOUNTER — Ambulatory Visit: Payer: Self-pay | Admitting: *Deleted

## 2018-07-22 ENCOUNTER — Encounter: Payer: Self-pay | Admitting: Internal Medicine

## 2018-07-22 ENCOUNTER — Ambulatory Visit: Payer: Self-pay | Admitting: *Deleted
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
## 2018-07-22 NOTE — Patient Instructions (Signed)

## 2018-07-23 ENCOUNTER — Other Ambulatory Visit: Payer: Self-pay | Admitting: *Deleted

## 2018-07-23 ENCOUNTER — Ambulatory Visit (INDEPENDENT_AMBULATORY_CARE_PROVIDER_SITE_OTHER): Payer: Medicare HMO | Admitting: Internal Medicine

## 2018-07-23 ENCOUNTER — Encounter: Payer: Self-pay | Admitting: Internal Medicine

## 2018-07-23 ENCOUNTER — Other Ambulatory Visit: Payer: Self-pay

## 2018-07-23 VITALS — BP 100/64 | HR 64 | Temp 97.6°F | Resp 16 | Ht 66.5 in | Wt 143.4 lb

## 2018-07-23 DIAGNOSIS — F03A0 Unspecified dementia, mild, without behavioral disturbance, psychotic disturbance, mood disturbance, and anxiety: Secondary | ICD-10-CM

## 2018-07-23 DIAGNOSIS — R7309 Other abnormal glucose: Secondary | ICD-10-CM

## 2018-07-23 DIAGNOSIS — F0390 Unspecified dementia without behavioral disturbance: Secondary | ICD-10-CM | POA: Diagnosis not present

## 2018-07-23 DIAGNOSIS — I10 Essential (primary) hypertension: Secondary | ICD-10-CM

## 2018-07-23 DIAGNOSIS — M1000 Idiopathic gout, unspecified site: Secondary | ICD-10-CM | POA: Diagnosis not present

## 2018-07-23 DIAGNOSIS — E559 Vitamin D deficiency, unspecified: Secondary | ICD-10-CM

## 2018-07-23 DIAGNOSIS — R7303 Prediabetes: Secondary | ICD-10-CM | POA: Diagnosis not present

## 2018-07-23 DIAGNOSIS — Z79899 Other long term (current) drug therapy: Secondary | ICD-10-CM | POA: Diagnosis not present

## 2018-07-23 DIAGNOSIS — E782 Mixed hyperlipidemia: Secondary | ICD-10-CM

## 2018-07-23 DIAGNOSIS — I5042 Chronic combined systolic (congestive) and diastolic (congestive) heart failure: Secondary | ICD-10-CM | POA: Diagnosis not present

## 2018-07-23 NOTE — Patient Outreach (Signed)
[facility]) Care Management  07/23/2018  Dennis [name] [birth year] [id number]   Transition of Care Referral  Referral Date:07/02/2018 Referral Source:[employer] inpatient referral  Date of Admission:06/27/2018 Diagnosis:ANGIONEUROTIC EDEMA INITIAL Date of Discharge:Facility:[facility] [place] on 06/30/18 Insurance:[employer] medicare  Dennis [name] [phone]) contacted by [facility] RN CM in regards to Dennis [name] who she cares for and has mild dementia She is able to verify HIPAA CM followed up to confirm they did receive EMMI information and a scale They are very appreciative of the assistance Dennis [name] confirms Dennis [name] is still  doing well at home and continues to be followed by [facility] health care providers but is in need of a BP cuff and BP check q am per Primary MD staff.   Dennis [name] voiced understanding and reports not having a BP cuff to check the BP in the am and pm. She reports it would be tasking to attempt get Dennis [name] out of the home to go to the nearest drug store for a BP check. CM discussed checking with [facility] CMA to have a BP cuff sent to the home. CM discussed a possible charge and Dennis [name] reports that the payee, Dennis [name] would pay for a BP cuff if needed.    Social: Dennis [name] is divorced, has been adjudicated as a ward of the State/DSS and living with Dennis [name], who is assisting in his care at the home. He has also Personal care services [facility]) 8 hours a day, a Teacher, adult education (a couple times a week), granddaughter and Dennis Dennis [name], Payee as support. Other family members not involved in his care He is independent/assist with his care needs. 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 (allergy-tongue swelling), Chronic systolic heart failure, pulmonary HTN, mild dementia, HTN, hx of ischemic vertebrobasilar  artery cerebellar stroke, BPH, vitamin D deficiency, gout, mixed HDL, noncompliance, generalized anxiety disorder, hx of DVT, hx of pulmonary embolus, depression   DME.: scales, 2 wheel walker, single prong cane, bedside commode, shower seat   Medications: She denies concerns with taking medications as prescribed, affording medications, side effects of medications and questions about medications  Appointments: She confirms he was seen by Dennis Kathryne Sharper NP, Elder Negus,  on 07/02/18 and is scheduled for a follow up on 07/23/18 a 1430    Advance Directives: She states Dennis Cieslik has a payee, Dennis Dennis [name] [phone] (Epic updated, Leitha Schuller, SW is no longer available)    Consent: [facility] RN CM reviewed [facility] services with patient. Patient gave verbal consent for services.   Plans  [facility] RN CM will assist by requesting patient be sent a BP with assist of [facility] CMA  [facility] RN CM will close case at this time as patient has been assessed and no needs identified/needs resolved.   Pt encouraged to return a call to [facility] RN CM prn   [name] L. Noelle Penner, RN, BSN, CCM [facility] Telephonic Care Management Care Coordinator Office number [phone] Mobile number [phone]  Main [facility] number [phone] Fax number [phone]

## 2018-07-24 DIAGNOSIS — K006 Disturbances in tooth eruption: Secondary | ICD-10-CM | POA: Diagnosis not present

## 2018-07-24 LAB — CBC WITH DIFFERENTIAL/PLATELET
Absolute Monocytes: 827 cells/uL (ref 200–950)
Basophils Absolute: 19 cells/uL (ref 0–200)
Basophils Relative: 0.2 %
Eosinophils Absolute: 86 cells/uL (ref 15–500)
Eosinophils Relative: 0.9 %
HCT: 40.6 % (ref 38.5–50.0)
Hemoglobin: 13.7 g/dL (ref 13.2–17.1)
Lymphs Abs: 1644 cells/uL (ref 850–3900)
MCH: 31.7 pg (ref 27.0–33.0)
MCHC: 33.7 g/dL (ref 32.0–36.0)
MCV: 94 fL (ref 80.0–100.0)
MPV: 10.9 fL (ref 7.5–12.5)
Monocytes Relative: 8.7 %
NEUTROS ABS: 6926 {cells}/uL (ref 1500–7800)
Neutrophils Relative %: 72.9 %
Platelets: 132 10*3/uL — ABNORMAL LOW (ref 140–400)
RBC: 4.32 10*6/uL (ref 4.20–5.80)
RDW: 13.1 % (ref 11.0–15.0)
Total Lymphocyte: 17.3 %
WBC: 9.5 10*3/uL (ref 3.8–10.8)

## 2018-07-24 LAB — MAGNESIUM: Magnesium: 2.2 mg/dL (ref 1.5–2.5)

## 2018-07-24 LAB — COMPLETE METABOLIC PANEL WITH GFR
AG Ratio: 1.4 (calc) (ref 1.0–2.5)
ALT: 8 U/L — ABNORMAL LOW (ref 9–46)
AST: 13 U/L (ref 10–35)
Albumin: 4.2 g/dL (ref 3.6–5.1)
Alkaline phosphatase (APISO): 68 U/L (ref 35–144)
BUN/Creatinine Ratio: 23 (calc) — ABNORMAL HIGH (ref 6–22)
BUN: 38 mg/dL — ABNORMAL HIGH (ref 7–25)
CALCIUM: 10 mg/dL (ref 8.6–10.3)
CO2: 30 mmol/L (ref 20–32)
Chloride: 109 mmol/L (ref 98–110)
Creat: 1.68 mg/dL — ABNORMAL HIGH (ref 0.70–1.18)
GFR, Est African American: 47 mL/min/{1.73_m2} — ABNORMAL LOW (ref >=60)
GFR, Est Non African American: 41 mL/min/{1.73_m2} — ABNORMAL LOW (ref >=60)
Globulin: 2.9 g/dL (calc) (ref 1.9–3.7)
Glucose, Bld: 102 mg/dL — ABNORMAL HIGH (ref 65–99)
Potassium: 4.3 mmol/L (ref 3.5–5.3)
Sodium: 147 mmol/L — ABNORMAL HIGH (ref 135–146)
Total Bilirubin: 0.8 mg/dL (ref 0.2–1.2)
Total Protein: 7.1 g/dL (ref 6.1–8.1)

## 2018-07-24 LAB — URIC ACID: Uric Acid, Serum: 6.7 mg/dL (ref 4.0–8.0)

## 2018-07-24 LAB — TSH: TSH: 1.03 mIU/L (ref 0.40–4.50)

## 2018-07-24 LAB — HEMOGLOBIN A1C
Hgb A1c MFr Bld: 6 % of total Hgb — ABNORMAL HIGH (ref <5.7)
Mean Plasma Glucose: 126 (calc)
eAG (mmol/L): 7 (calc)

## 2018-07-24 LAB — LIPID PANEL
CHOLESTEROL: 142 mg/dL (ref <200)
HDL: 64 mg/dL (ref >=40)
LDL Cholesterol (Calc): 58 mg/dL (calc)
Non-HDL Cholesterol (Calc): 78 mg/dL (calc) (ref <130)
Total CHOL/HDL Ratio: 2.2 (calc) (ref <5.0)
Triglycerides: 123 mg/dL (ref <150)

## 2018-07-24 LAB — INSULIN, RANDOM: Insulin: 1.6 u[IU]/mL

## 2018-07-24 LAB — VITAMIN D 25 HYDROXY (VIT D DEFICIENCY, FRACTURES): Vit D, 25-Hydroxy: 52 ng/mL (ref 30–100)

## 2018-07-25 ENCOUNTER — Telehealth: Payer: Self-pay | Admitting: *Deleted

## 2018-07-25 NOTE — Telephone Encounter (Signed)
Caregiver called and states patient has had diarrhea for 2 days.  Per Dr Oneta Rack, take OTC Imodium, up to 12 tablets daily, and avoid milk and dairy products.  Dennis [name] is aware.

## 2018-08-24 ENCOUNTER — Other Ambulatory Visit: Payer: Self-pay | Admitting: Internal Medicine

## 2018-08-24 DIAGNOSIS — Z79899 Other long term (current) drug therapy: Secondary | ICD-10-CM

## 2018-08-24 DIAGNOSIS — E782 Mixed hyperlipidemia: Secondary | ICD-10-CM

## 2018-08-27 ENCOUNTER — Telehealth (INDEPENDENT_AMBULATORY_CARE_PROVIDER_SITE_OTHER): Payer: Medicare HMO | Admitting: Physician Assistant

## 2018-08-27 DIAGNOSIS — I5022 Chronic systolic (congestive) heart failure: Secondary | ICD-10-CM | POA: Diagnosis not present

## 2018-08-27 DIAGNOSIS — Z789 Other specified health status: Secondary | ICD-10-CM | POA: Diagnosis not present

## 2018-08-27 DIAGNOSIS — I10 Essential (primary) hypertension: Secondary | ICD-10-CM

## 2018-08-27 DIAGNOSIS — F0390 Unspecified dementia without behavioral disturbance: Secondary | ICD-10-CM

## 2018-08-27 DIAGNOSIS — F03A0 Unspecified dementia, mild, without behavioral disturbance, psychotic disturbance, mood disturbance, and anxiety: Secondary | ICD-10-CM

## 2018-08-27 NOTE — Telephone Encounter (Signed)
CCM telephone visit August AlbinoJanette Harrison cares for Dennis [name] who has dementia, CHF, HTN. He currently has scales and a BP cuff, monitoring vitals daily.  Dennis [name] is a ward of the state/DSS.  He needs transportation and assistant with his medications. He has a cane and walker, no recent falls.   Medications reviewed/reconciled: yes   Current Outpatient Medications (Cardiovascular):  .  amLODipine (NORVASC) 10 MG tablet, Take 1 tablet (10 mg total) by mouth daily. .  carvedilol (COREG) 25 MG tablet, Take 0.5 tablets (12.5 mg total) by mouth 2 (two) times daily with a meal. .  furosemide (LASIX) 40 MG tablet, Take 40 mg by mouth daily. Marland Kitchen.  lisinopril (PRINIVIL,ZESTRIL) 10 MG tablet, TAKE 1 TABLET EVERY DAY FOR BLOOD PRESSURE .  pravastatin (PRAVACHOL) 20 MG tablet, TAKE 1 TABLET AT BEDTIME    Current Outpatient Medications (Hematological):  .  rivaroxaban (XARELTO) 20 MG TABS tablet, Take 1 tablet (20 mg total) by mouth every evening.  Current Outpatient Medications (Other):  Marland Kitchen.  Cholecalciferol (VITAMIN D) 50 MCG (2000 UT) tablet, Take 2 tablets (4,000 Units total) by mouth daily. Marland Kitchen.  donepezil (ARICEPT) 5 MG tablet, Take 1 tablet (5 mg total) by mouth daily. .  sertraline (ZOLOFT) 50 MG tablet, TAKE 1 TABLET EVERY DAY FOR MOOD .  tamsulosin (FLOMAX) 0.4 MG CAPS capsule, Take 1 capsule (0.4 mg total) by mouth at bedtime.  Medication adherence: yes Side effects/intolerances reviewed: no Refills needed:no  Health Logs: will have at next meeting  Goals Reviewed:  Goals    . Blood Pressure < 120/80     Monitor BP daily Continue medications.     . Exercise 150 min/wk Moderate Activity per week     . Record weight daily     Monitor weight daily Will call if a gain or loss of 5 lbs in a day       Progress towards goals:  Barriers Identified:  financial need and lack of understanding of disease management  Any patient concerns?:  Medication and Monitoring  Having frequent calls,  [facility] and caregiver.   Plan: Continue to call frequently and check on patient's progress with weight, BP, and exercise.   Recent Visits Date Type Provider Dept  07/23/18 Office Visit [name], [name], MD [facility]  07/02/18 Office Visit Elder NegusMcClanahan, Kyra, NP [facility]  04/22/18 Office Visit Judd Gaudierorbett, Ashley, NP [facility]  01/15/18 Office Visit [name], [name], MD [facility]  09/13/17 Office Visit Quentin Mullingollier, Arav Bannister, PA-C [facility]  07/03/17 Office Visit Quentin Mullingollier, Randie Tallarico, PA-C [facility]  04/04/17 Office Visit Quentin Mullingollier, Ersilia Brawley, PA-C [facility]  Showing recent visits within past 540 days with a meds authorizing provider and meeting all other requirements   Future Appointments Date Type Provider Dept  10/24/18 Appointment Quentin Mullingollier, Crawford Tamura, PA-C [facility]  Showing future appointments within next 150 days with a meds authorizing provider and meeting all other requirements   Consent: Dennis [name] was given information about Chronic Care Management services today including:  1. CCM service includes personalized support from designated clinical staff supervised by his physician, including individualized plan of care and coordination with other care providers 2. 24/7 contact phone numbers for assistance for urgent and routine care needs. 3. Service will only be billed when office clinical staff spend 20 minutes or more  in a month to coordinate care. 4. Only one practitioner may furnish and bill the service in a calendar month. 5. The patient may stop CCM services at any time (effective at the end of the month) by phone call to the office staff.  Patient and care giver agreed to services and verbal consent obtained.    Time Spent: [address]  Quentin Mulling, PA-C

## 2018-09-19 ENCOUNTER — Other Ambulatory Visit: Payer: Self-pay | Admitting: Physician Assistant

## 2018-09-19 DIAGNOSIS — N401 Enlarged prostate with lower urinary tract symptoms: Secondary | ICD-10-CM

## 2018-10-21 DIAGNOSIS — K006 Disturbances in tooth eruption: Secondary | ICD-10-CM | POA: Diagnosis not present
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
## 2018-10-24 ENCOUNTER — Ambulatory Visit: Payer: Self-pay | Admitting: Physician Assistant

## 2018-10-31 DIAGNOSIS — N183 Chronic kidney disease, stage 3 unspecified: Secondary | ICD-10-CM | POA: Insufficient documentation
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
## 2018-11-01 ENCOUNTER — Encounter: Payer: Self-pay | Admitting: Adult Health

## 2018-11-01 ENCOUNTER — Ambulatory Visit: Payer: Medicare HMO | Admitting: Adult Health

## 2018-11-01 ENCOUNTER — Other Ambulatory Visit: Payer: Self-pay

## 2018-11-01 ENCOUNTER — Other Ambulatory Visit: Payer: Self-pay | Admitting: Adult Health

## 2018-11-01 VITALS — BP 111/73 | HR 63 | Wt 147.0 lb

## 2018-11-01 DIAGNOSIS — I110 Hypertensive heart disease with heart failure: Secondary | ICD-10-CM

## 2018-11-01 DIAGNOSIS — Z9181 History of falling: Secondary | ICD-10-CM

## 2018-11-01 DIAGNOSIS — R7309 Other abnormal glucose: Secondary | ICD-10-CM

## 2018-11-01 DIAGNOSIS — I10 Essential (primary) hypertension: Secondary | ICD-10-CM

## 2018-11-01 DIAGNOSIS — I5022 Chronic systolic (congestive) heart failure: Secondary | ICD-10-CM | POA: Diagnosis not present

## 2018-11-01 DIAGNOSIS — F0390 Unspecified dementia without behavioral disturbance: Secondary | ICD-10-CM | POA: Diagnosis not present

## 2018-11-01 DIAGNOSIS — D6859 Other primary thrombophilia: Secondary | ICD-10-CM

## 2018-11-01 DIAGNOSIS — Z0001 Encounter for general adult medical examination with abnormal findings: Secondary | ICD-10-CM | POA: Diagnosis not present

## 2018-11-01 DIAGNOSIS — N183 Chronic kidney disease, stage 3 unspecified: Secondary | ICD-10-CM

## 2018-11-01 DIAGNOSIS — I272 Pulmonary hypertension, unspecified: Secondary | ICD-10-CM

## 2018-11-01 DIAGNOSIS — M1000 Idiopathic gout, unspecified site: Secondary | ICD-10-CM

## 2018-11-01 DIAGNOSIS — R6889 Other general symptoms and signs: Secondary | ICD-10-CM

## 2018-11-01 DIAGNOSIS — E559 Vitamin D deficiency, unspecified: Secondary | ICD-10-CM

## 2018-11-01 DIAGNOSIS — Z8673 Personal history of transient ischemic attack (TIA), and cerebral infarction without residual deficits: Secondary | ICD-10-CM

## 2018-11-01 DIAGNOSIS — R350 Frequency of micturition: Secondary | ICD-10-CM

## 2018-11-01 DIAGNOSIS — Z6822 Body mass index (BMI) 22.0-22.9, adult: Secondary | ICD-10-CM

## 2018-11-01 DIAGNOSIS — N401 Enlarged prostate with lower urinary tract symptoms: Secondary | ICD-10-CM

## 2018-11-01 DIAGNOSIS — Z86718 Personal history of other venous thrombosis and embolism: Secondary | ICD-10-CM

## 2018-11-01 DIAGNOSIS — Z0000 Encounter for general adult medical examination without abnormal findings: Secondary | ICD-10-CM

## 2018-11-01 DIAGNOSIS — Z91199 Patient's noncompliance with other medical treatment and regimen due to unspecified reason: Secondary | ICD-10-CM

## 2018-11-01 DIAGNOSIS — F411 Generalized anxiety disorder: Secondary | ICD-10-CM

## 2018-11-01 DIAGNOSIS — Z9119 Patient's noncompliance with other medical treatment and regimen: Secondary | ICD-10-CM

## 2018-11-01 DIAGNOSIS — Z86711 Personal history of pulmonary embolism: Secondary | ICD-10-CM

## 2018-11-01 DIAGNOSIS — F03A0 Unspecified dementia, mild, without behavioral disturbance, psychotic disturbance, mood disturbance, and anxiety: Secondary | ICD-10-CM

## 2018-11-01 DIAGNOSIS — E782 Mixed hyperlipidemia: Secondary | ICD-10-CM

## 2018-11-01 DIAGNOSIS — Z79899 Other long term (current) drug therapy: Secondary | ICD-10-CM

## 2018-11-07 ENCOUNTER — Other Ambulatory Visit: Payer: Self-pay

## 2018-11-07 ENCOUNTER — Ambulatory Visit (INDEPENDENT_AMBULATORY_CARE_PROVIDER_SITE_OTHER): Payer: Medicare HMO

## 2018-11-07 DIAGNOSIS — N183 Chronic kidney disease, stage 3 unspecified: Secondary | ICD-10-CM

## 2018-11-07 DIAGNOSIS — E782 Mixed hyperlipidemia: Secondary | ICD-10-CM

## 2018-11-07 DIAGNOSIS — I110 Hypertensive heart disease with heart failure: Secondary | ICD-10-CM | POA: Diagnosis not present

## 2018-11-07 DIAGNOSIS — R7309 Other abnormal glucose: Secondary | ICD-10-CM

## 2018-11-07 NOTE — [place] Notes (Signed)
Patient presents to the office for a nurse visit to have labs drawn. Previously due for labs from previous phone visit. Vitals taken and recorded.

## 2018-11-08 LAB — TSH: TSH: 0.77 mIU/L (ref 0.40–4.50)

## 2018-11-08 LAB — COMPLETE METABOLIC PANEL WITH GFR
AG Ratio: 1.6 (calc) (ref 1.0–2.5)
ALT: 7 U/L — ABNORMAL LOW (ref 9–46)
AST: 11 U/L (ref 10–35)
Albumin: 4.2 g/dL (ref 3.6–5.1)
Alkaline phosphatase (APISO): 54 U/L (ref 35–144)
BUN/Creatinine Ratio: 19 (calc) (ref 6–22)
BUN: 34 mg/dL — ABNORMAL HIGH (ref 7–25)
CO2: 28 mmol/L (ref 20–32)
Calcium: 9.9 mg/dL (ref 8.6–10.3)
Chloride: 104 mmol/L (ref 98–110)
Creat: 1.78 mg/dL — ABNORMAL HIGH (ref 0.70–1.18)
GFR, Est African American: 44 mL/min/{1.73_m2} — ABNORMAL LOW (ref >=60)
GFR, Est Non African American: 38 mL/min/{1.73_m2} — ABNORMAL LOW (ref >=60)
Globulin: 2.6 g/dL (calc) (ref 1.9–3.7)
Glucose, Bld: 190 mg/dL — ABNORMAL HIGH (ref 65–99)
Potassium: 4 mmol/L (ref 3.5–5.3)
Sodium: 141 mmol/L (ref 135–146)
Total Bilirubin: 0.6 mg/dL (ref 0.2–1.2)
Total Protein: 6.8 g/dL (ref 6.1–8.1)

## 2018-11-08 LAB — CBC WITH DIFFERENTIAL/PLATELET
Absolute Monocytes: 640 cells/uL (ref 200–950)
Basophils Absolute: 16 cells/uL (ref 0–200)
Basophils Relative: 0.2 %
Eosinophils Absolute: 123 cells/uL (ref 15–500)
Eosinophils Relative: 1.5 %
HCT: 37.6 % — ABNORMAL LOW (ref 38.5–50.0)
Hemoglobin: 12.7 g/dL — ABNORMAL LOW (ref 13.2–17.1)
Lymphs Abs: 1443 cells/uL (ref 850–3900)
MCH: 31.5 pg (ref 27.0–33.0)
MCHC: 33.8 g/dL (ref 32.0–36.0)
MCV: 93.3 fL (ref 80.0–100.0)
MPV: 11.3 fL (ref 7.5–12.5)
Monocytes Relative: 7.8 %
Neutro Abs: 5978 cells/uL (ref 1500–7800)
Neutrophils Relative %: 72.9 %
Platelets: 122 10*3/uL — ABNORMAL LOW (ref 140–400)
RBC: 4.03 10*6/uL — ABNORMAL LOW (ref 4.20–5.80)
RDW: 12.9 % (ref 11.0–15.0)
Total Lymphocyte: 17.6 %
WBC: 8.2 10*3/uL (ref 3.8–10.8)

## 2018-11-08 LAB — HEMOGLOBIN A1C
Hgb A1c MFr Bld: 5.8 % of total Hgb — ABNORMAL HIGH (ref <5.7)
Mean Plasma Glucose: 120 (calc)
eAG (mmol/L): 6.6 (calc)

## 2018-11-08 LAB — LIPID PANEL
Cholesterol: 152 mg/dL (ref <200)
HDL: 54 mg/dL (ref >=40)
LDL Cholesterol (Calc): 82 mg/dL (calc)
Non-HDL Cholesterol (Calc): 98 mg/dL (calc) (ref <130)
Total CHOL/HDL Ratio: 2.8 (calc) (ref <5.0)
Triglycerides: 79 mg/dL (ref <150)

## 2018-11-08 LAB — MAGNESIUM: Magnesium: 2.1 mg/dL (ref 1.5–2.5)

## 2018-11-19 DIAGNOSIS — I5022 Chronic systolic (congestive) heart failure: Secondary | ICD-10-CM | POA: Diagnosis not present

## 2018-11-19 DIAGNOSIS — N183 Chronic kidney disease, stage 3 (moderate): Secondary | ICD-10-CM | POA: Diagnosis not present

## 2018-11-19 DIAGNOSIS — F0390 Unspecified dementia without behavioral disturbance: Secondary | ICD-10-CM | POA: Diagnosis not present

## 2018-11-19 DIAGNOSIS — I272 Pulmonary hypertension, unspecified: Secondary | ICD-10-CM | POA: Diagnosis not present

## 2018-11-19 DIAGNOSIS — I130 Hypertensive heart and chronic kidney disease with heart failure and stage 1 through stage 4 chronic kidney disease, or unspecified chronic kidney disease: Secondary | ICD-10-CM | POA: Diagnosis not present

## 2018-11-19 DIAGNOSIS — R2689 Other abnormalities of gait and mobility: Secondary | ICD-10-CM | POA: Diagnosis not present

## 2018-11-19 DIAGNOSIS — I69398 Other sequelae of cerebral infarction: Secondary | ICD-10-CM | POA: Diagnosis not present

## 2018-11-19 DIAGNOSIS — N401 Enlarged prostate with lower urinary tract symptoms: Secondary | ICD-10-CM | POA: Diagnosis not present

## 2018-11-19 DIAGNOSIS — R350 Frequency of micturition: Secondary | ICD-10-CM | POA: Diagnosis not present

## 2018-11-20 DIAGNOSIS — I69398 Other sequelae of cerebral infarction: Secondary | ICD-10-CM | POA: Diagnosis not present

## 2018-11-20 DIAGNOSIS — I272 Pulmonary hypertension, unspecified: Secondary | ICD-10-CM | POA: Diagnosis not present

## 2018-11-20 DIAGNOSIS — R350 Frequency of micturition: Secondary | ICD-10-CM | POA: Diagnosis not present

## 2018-11-20 DIAGNOSIS — I5022 Chronic systolic (congestive) heart failure: Secondary | ICD-10-CM | POA: Diagnosis not present

## 2018-11-20 DIAGNOSIS — R2689 Other abnormalities of gait and mobility: Secondary | ICD-10-CM | POA: Diagnosis not present

## 2018-11-20 DIAGNOSIS — N183 Chronic kidney disease, stage 3 (moderate): Secondary | ICD-10-CM | POA: Diagnosis not present

## 2018-11-20 DIAGNOSIS — N401 Enlarged prostate with lower urinary tract symptoms: Secondary | ICD-10-CM | POA: Diagnosis not present

## 2018-11-20 DIAGNOSIS — I130 Hypertensive heart and chronic kidney disease with heart failure and stage 1 through stage 4 chronic kidney disease, or unspecified chronic kidney disease: Secondary | ICD-10-CM | POA: Diagnosis not present

## 2018-11-20 DIAGNOSIS — F0390 Unspecified dementia without behavioral disturbance: Secondary | ICD-10-CM | POA: Diagnosis not present

## 2018-11-29 DIAGNOSIS — I130 Hypertensive heart and chronic kidney disease with heart failure and stage 1 through stage 4 chronic kidney disease, or unspecified chronic kidney disease: Secondary | ICD-10-CM | POA: Diagnosis not present

## 2018-11-29 DIAGNOSIS — I5022 Chronic systolic (congestive) heart failure: Secondary | ICD-10-CM | POA: Diagnosis not present

## 2018-11-29 DIAGNOSIS — N183 Chronic kidney disease, stage 3 (moderate): Secondary | ICD-10-CM | POA: Diagnosis not present

## 2018-11-29 DIAGNOSIS — R350 Frequency of micturition: Secondary | ICD-10-CM | POA: Diagnosis not present

## 2018-11-29 DIAGNOSIS — R2689 Other abnormalities of gait and mobility: Secondary | ICD-10-CM | POA: Diagnosis not present

## 2018-11-29 DIAGNOSIS — F0390 Unspecified dementia without behavioral disturbance: Secondary | ICD-10-CM | POA: Diagnosis not present

## 2018-11-29 DIAGNOSIS — I272 Pulmonary hypertension, unspecified: Secondary | ICD-10-CM | POA: Diagnosis not present

## 2018-11-29 DIAGNOSIS — N401 Enlarged prostate with lower urinary tract symptoms: Secondary | ICD-10-CM | POA: Diagnosis not present

## 2018-11-29 DIAGNOSIS — I69398 Other sequelae of cerebral infarction: Secondary | ICD-10-CM | POA: Diagnosis not present

## 2018-12-03 DIAGNOSIS — N401 Enlarged prostate with lower urinary tract symptoms: Secondary | ICD-10-CM | POA: Diagnosis not present

## 2018-12-03 DIAGNOSIS — I272 Pulmonary hypertension, unspecified: Secondary | ICD-10-CM | POA: Diagnosis not present

## 2018-12-03 DIAGNOSIS — N183 Chronic kidney disease, stage 3 (moderate): Secondary | ICD-10-CM | POA: Diagnosis not present

## 2018-12-03 DIAGNOSIS — R2689 Other abnormalities of gait and mobility: Secondary | ICD-10-CM | POA: Diagnosis not present

## 2018-12-03 DIAGNOSIS — F0390 Unspecified dementia without behavioral disturbance: Secondary | ICD-10-CM | POA: Diagnosis not present

## 2018-12-03 DIAGNOSIS — I130 Hypertensive heart and chronic kidney disease with heart failure and stage 1 through stage 4 chronic kidney disease, or unspecified chronic kidney disease: Secondary | ICD-10-CM | POA: Diagnosis not present

## 2018-12-03 DIAGNOSIS — R350 Frequency of micturition: Secondary | ICD-10-CM | POA: Diagnosis not present

## 2018-12-03 DIAGNOSIS — I69398 Other sequelae of cerebral infarction: Secondary | ICD-10-CM | POA: Diagnosis not present

## 2018-12-03 DIAGNOSIS — I5022 Chronic systolic (congestive) heart failure: Secondary | ICD-10-CM | POA: Diagnosis not present

## 2018-12-09 DIAGNOSIS — R350 Frequency of micturition: Secondary | ICD-10-CM | POA: Diagnosis not present

## 2018-12-09 DIAGNOSIS — I272 Pulmonary hypertension, unspecified: Secondary | ICD-10-CM | POA: Diagnosis not present

## 2018-12-09 DIAGNOSIS — I130 Hypertensive heart and chronic kidney disease with heart failure and stage 1 through stage 4 chronic kidney disease, or unspecified chronic kidney disease: Secondary | ICD-10-CM | POA: Diagnosis not present

## 2018-12-09 DIAGNOSIS — I5022 Chronic systolic (congestive) heart failure: Secondary | ICD-10-CM | POA: Diagnosis not present

## 2018-12-09 DIAGNOSIS — F0390 Unspecified dementia without behavioral disturbance: Secondary | ICD-10-CM | POA: Diagnosis not present

## 2018-12-09 DIAGNOSIS — N183 Chronic kidney disease, stage 3 (moderate): Secondary | ICD-10-CM | POA: Diagnosis not present

## 2018-12-09 DIAGNOSIS — R2689 Other abnormalities of gait and mobility: Secondary | ICD-10-CM | POA: Diagnosis not present

## 2018-12-09 DIAGNOSIS — N401 Enlarged prostate with lower urinary tract symptoms: Secondary | ICD-10-CM | POA: Diagnosis not present

## 2018-12-09 DIAGNOSIS — I69398 Other sequelae of cerebral infarction: Secondary | ICD-10-CM | POA: Diagnosis not present

## 2018-12-16 DIAGNOSIS — N183 Chronic kidney disease, stage 3 (moderate): Secondary | ICD-10-CM | POA: Diagnosis not present

## 2018-12-16 DIAGNOSIS — I130 Hypertensive heart and chronic kidney disease with heart failure and stage 1 through stage 4 chronic kidney disease, or unspecified chronic kidney disease: Secondary | ICD-10-CM | POA: Diagnosis not present

## 2018-12-16 DIAGNOSIS — I5022 Chronic systolic (congestive) heart failure: Secondary | ICD-10-CM | POA: Diagnosis not present

## 2018-12-16 DIAGNOSIS — R2689 Other abnormalities of gait and mobility: Secondary | ICD-10-CM | POA: Diagnosis not present

## 2018-12-16 DIAGNOSIS — R350 Frequency of micturition: Secondary | ICD-10-CM | POA: Diagnosis not present

## 2018-12-16 DIAGNOSIS — I69398 Other sequelae of cerebral infarction: Secondary | ICD-10-CM | POA: Diagnosis not present

## 2018-12-16 DIAGNOSIS — F0390 Unspecified dementia without behavioral disturbance: Secondary | ICD-10-CM | POA: Diagnosis not present

## 2018-12-16 DIAGNOSIS — I272 Pulmonary hypertension, unspecified: Secondary | ICD-10-CM | POA: Diagnosis not present

## 2018-12-16 DIAGNOSIS — N401 Enlarged prostate with lower urinary tract symptoms: Secondary | ICD-10-CM | POA: Diagnosis not present

## 2018-12-18 DIAGNOSIS — R2689 Other abnormalities of gait and mobility: Secondary | ICD-10-CM | POA: Diagnosis not present

## 2018-12-18 DIAGNOSIS — I130 Hypertensive heart and chronic kidney disease with heart failure and stage 1 through stage 4 chronic kidney disease, or unspecified chronic kidney disease: Secondary | ICD-10-CM | POA: Diagnosis not present

## 2018-12-18 DIAGNOSIS — R350 Frequency of micturition: Secondary | ICD-10-CM | POA: Diagnosis not present

## 2018-12-18 DIAGNOSIS — N183 Chronic kidney disease, stage 3 (moderate): Secondary | ICD-10-CM | POA: Diagnosis not present

## 2018-12-18 DIAGNOSIS — I5022 Chronic systolic (congestive) heart failure: Secondary | ICD-10-CM | POA: Diagnosis not present

## 2018-12-18 DIAGNOSIS — F0390 Unspecified dementia without behavioral disturbance: Secondary | ICD-10-CM | POA: Diagnosis not present

## 2018-12-18 DIAGNOSIS — I272 Pulmonary hypertension, unspecified: Secondary | ICD-10-CM | POA: Diagnosis not present

## 2018-12-18 DIAGNOSIS — I69398 Other sequelae of cerebral infarction: Secondary | ICD-10-CM | POA: Diagnosis not present

## 2018-12-18 DIAGNOSIS — N401 Enlarged prostate with lower urinary tract symptoms: Secondary | ICD-10-CM | POA: Diagnosis not present

## 2018-12-23 DIAGNOSIS — I272 Pulmonary hypertension, unspecified: Secondary | ICD-10-CM | POA: Diagnosis not present

## 2018-12-23 DIAGNOSIS — F0390 Unspecified dementia without behavioral disturbance: Secondary | ICD-10-CM | POA: Diagnosis not present

## 2018-12-23 DIAGNOSIS — I130 Hypertensive heart and chronic kidney disease with heart failure and stage 1 through stage 4 chronic kidney disease, or unspecified chronic kidney disease: Secondary | ICD-10-CM | POA: Diagnosis not present

## 2018-12-23 DIAGNOSIS — N401 Enlarged prostate with lower urinary tract symptoms: Secondary | ICD-10-CM | POA: Diagnosis not present

## 2018-12-23 DIAGNOSIS — R2689 Other abnormalities of gait and mobility: Secondary | ICD-10-CM | POA: Diagnosis not present

## 2018-12-23 DIAGNOSIS — I5022 Chronic systolic (congestive) heart failure: Secondary | ICD-10-CM | POA: Diagnosis not present

## 2018-12-23 DIAGNOSIS — I69398 Other sequelae of cerebral infarction: Secondary | ICD-10-CM | POA: Diagnosis not present

## 2018-12-23 DIAGNOSIS — N183 Chronic kidney disease, stage 3 (moderate): Secondary | ICD-10-CM | POA: Diagnosis not present

## 2018-12-23 DIAGNOSIS — R350 Frequency of micturition: Secondary | ICD-10-CM | POA: Diagnosis not present

## 2018-12-25 DIAGNOSIS — F0390 Unspecified dementia without behavioral disturbance: Secondary | ICD-10-CM | POA: Diagnosis not present

## 2018-12-25 DIAGNOSIS — I130 Hypertensive heart and chronic kidney disease with heart failure and stage 1 through stage 4 chronic kidney disease, or unspecified chronic kidney disease: Secondary | ICD-10-CM | POA: Diagnosis not present

## 2018-12-25 DIAGNOSIS — I69398 Other sequelae of cerebral infarction: Secondary | ICD-10-CM | POA: Diagnosis not present

## 2018-12-25 DIAGNOSIS — I5022 Chronic systolic (congestive) heart failure: Secondary | ICD-10-CM | POA: Diagnosis not present

## 2018-12-25 DIAGNOSIS — N401 Enlarged prostate with lower urinary tract symptoms: Secondary | ICD-10-CM | POA: Diagnosis not present

## 2018-12-25 DIAGNOSIS — R2689 Other abnormalities of gait and mobility: Secondary | ICD-10-CM | POA: Diagnosis not present

## 2018-12-25 DIAGNOSIS — R350 Frequency of micturition: Secondary | ICD-10-CM | POA: Diagnosis not present

## 2018-12-25 DIAGNOSIS — I272 Pulmonary hypertension, unspecified: Secondary | ICD-10-CM | POA: Diagnosis not present

## 2018-12-25 DIAGNOSIS — N183 Chronic kidney disease, stage 3 (moderate): Secondary | ICD-10-CM | POA: Diagnosis not present

## 2018-12-30 ENCOUNTER — Other Ambulatory Visit: Payer: Self-pay | Admitting: Physician Assistant

## 2018-12-30 ENCOUNTER — Other Ambulatory Visit: Payer: Self-pay

## 2018-12-30 ENCOUNTER — Other Ambulatory Visit: Payer: Self-pay | Admitting: Internal Medicine

## 2018-12-30 DIAGNOSIS — N183 Chronic kidney disease, stage 3 (moderate): Secondary | ICD-10-CM | POA: Diagnosis not present

## 2018-12-30 DIAGNOSIS — F0390 Unspecified dementia without behavioral disturbance: Secondary | ICD-10-CM | POA: Diagnosis not present

## 2018-12-30 DIAGNOSIS — N401 Enlarged prostate with lower urinary tract symptoms: Secondary | ICD-10-CM

## 2018-12-30 DIAGNOSIS — I130 Hypertensive heart and chronic kidney disease with heart failure and stage 1 through stage 4 chronic kidney disease, or unspecified chronic kidney disease: Secondary | ICD-10-CM | POA: Diagnosis not present

## 2018-12-30 DIAGNOSIS — I69398 Other sequelae of cerebral infarction: Secondary | ICD-10-CM | POA: Diagnosis not present

## 2018-12-30 DIAGNOSIS — D6859 Other primary thrombophilia: Secondary | ICD-10-CM

## 2018-12-30 DIAGNOSIS — R350 Frequency of micturition: Secondary | ICD-10-CM | POA: Diagnosis not present

## 2018-12-30 DIAGNOSIS — Z79899 Other long term (current) drug therapy: Secondary | ICD-10-CM

## 2018-12-30 DIAGNOSIS — I5022 Chronic systolic (congestive) heart failure: Secondary | ICD-10-CM | POA: Diagnosis not present

## 2018-12-30 DIAGNOSIS — I272 Pulmonary hypertension, unspecified: Secondary | ICD-10-CM | POA: Diagnosis not present

## 2018-12-30 DIAGNOSIS — F03A0 Unspecified dementia, mild, without behavioral disturbance, psychotic disturbance, mood disturbance, and anxiety: Secondary | ICD-10-CM

## 2018-12-30 DIAGNOSIS — R2689 Other abnormalities of gait and mobility: Secondary | ICD-10-CM | POA: Diagnosis not present

## 2018-12-30 DIAGNOSIS — E782 Mixed hyperlipidemia: Secondary | ICD-10-CM

## 2018-12-30 DIAGNOSIS — I10 Essential (primary) hypertension: Secondary | ICD-10-CM

## 2018-12-30 MED ORDER — PRAVASTATIN SODIUM 20 MG PO TABS: 20.0000 mg | ORAL_TABLET | Freq: Every day | ORAL | 1 refills | Status: DC

## 2018-12-30 MED ORDER — AMLODIPINE BESYLATE 10 MG PO TABS: 10.0000 mg | ORAL_TABLET | Freq: Every day | ORAL | 1 refills | Status: DC

## 2018-12-30 MED ORDER — SERTRALINE HCL 50 MG PO TABS: ORAL_TABLET | ORAL | 1 refills | Status: DC

## 2018-12-30 MED ORDER — VITAMIN D 50 MCG (2000 UT) PO TABS: 4000.0000 [IU] | ORAL_TABLET | Freq: Every day | ORAL | 1 refills | Status: DC

## 2018-12-30 MED ORDER — TAMSULOSIN HCL 0.4 MG PO CAPS: 0.4000 mg | ORAL_CAPSULE | Freq: Every day | ORAL | 1 refills | Status: DC

## 2018-12-30 MED ORDER — CARVEDILOL 25 MG PO TABS: 12.5000 mg | ORAL_TABLET | Freq: Two times a day (BID) | ORAL | 1 refills | Status: DC

## 2018-12-30 MED ORDER — DONEPEZIL HCL 5 MG PO TABS: 5.0000 mg | ORAL_TABLET | Freq: Every day | ORAL | 1 refills | Status: DC

## 2018-12-30 MED ORDER — FUROSEMIDE 40 MG PO TABS: ORAL_TABLET | ORAL | 1 refills | Status: DC

## 2018-12-30 MED ORDER — RIVAROXABAN 20 MG PO TABS: 20.0000 mg | ORAL_TABLET | Freq: Every evening | ORAL | 1 refills | Status: DC

## 2019-01-01 DIAGNOSIS — N183 Chronic kidney disease, stage 3 (moderate): Secondary | ICD-10-CM | POA: Diagnosis not present

## 2019-01-01 DIAGNOSIS — I5022 Chronic systolic (congestive) heart failure: Secondary | ICD-10-CM | POA: Diagnosis not present

## 2019-01-01 DIAGNOSIS — I130 Hypertensive heart and chronic kidney disease with heart failure and stage 1 through stage 4 chronic kidney disease, or unspecified chronic kidney disease: Secondary | ICD-10-CM | POA: Diagnosis not present

## 2019-01-01 DIAGNOSIS — I272 Pulmonary hypertension, unspecified: Secondary | ICD-10-CM | POA: Diagnosis not present

## 2019-01-01 DIAGNOSIS — N401 Enlarged prostate with lower urinary tract symptoms: Secondary | ICD-10-CM | POA: Diagnosis not present

## 2019-01-01 DIAGNOSIS — R2689 Other abnormalities of gait and mobility: Secondary | ICD-10-CM | POA: Diagnosis not present

## 2019-01-01 DIAGNOSIS — I69398 Other sequelae of cerebral infarction: Secondary | ICD-10-CM | POA: Diagnosis not present

## 2019-01-01 DIAGNOSIS — R350 Frequency of micturition: Secondary | ICD-10-CM | POA: Diagnosis not present

## 2019-01-01 DIAGNOSIS — F0390 Unspecified dementia without behavioral disturbance: Secondary | ICD-10-CM | POA: Diagnosis not present

## 2019-01-04 ENCOUNTER — Emergency Department (HOSPITAL_COMMUNITY)
Admission: EM | Admit: 2019-01-04 | Discharge: 2019-01-04 | Disposition: A | Discharge status: Home / Self Care | Payer: Medicare HMO | Attending: Emergency Medicine | Admitting: Emergency Medicine

## 2019-01-04 ENCOUNTER — Other Ambulatory Visit: Payer: Self-pay

## 2019-01-04 ENCOUNTER — Encounter (HOSPITAL_COMMUNITY): Payer: Self-pay

## 2019-01-04 ENCOUNTER — Emergency Department (HOSPITAL_COMMUNITY): Payer: Medicare HMO

## 2019-01-04 DIAGNOSIS — Z7901 Long term (current) use of anticoagulants: Secondary | ICD-10-CM | POA: Insufficient documentation

## 2019-01-04 DIAGNOSIS — I130 Hypertensive heart and chronic kidney disease with heart failure and stage 1 through stage 4 chronic kidney disease, or unspecified chronic kidney disease: Secondary | ICD-10-CM | POA: Diagnosis not present

## 2019-01-04 DIAGNOSIS — Z79899 Other long term (current) drug therapy: Secondary | ICD-10-CM | POA: Diagnosis not present

## 2019-01-04 DIAGNOSIS — Z96641 Presence of right artificial hip joint: Secondary | ICD-10-CM | POA: Diagnosis not present

## 2019-01-04 DIAGNOSIS — F0390 Unspecified dementia without behavioral disturbance: Secondary | ICD-10-CM | POA: Insufficient documentation

## 2019-01-04 DIAGNOSIS — N183 Chronic kidney disease, stage 3 (moderate): Secondary | ICD-10-CM | POA: Diagnosis not present

## 2019-01-04 DIAGNOSIS — R319 Hematuria, unspecified: Secondary | ICD-10-CM | POA: Diagnosis not present

## 2019-01-04 DIAGNOSIS — I5022 Chronic systolic (congestive) heart failure: Secondary | ICD-10-CM | POA: Insufficient documentation

## 2019-01-04 DIAGNOSIS — N1330 Unspecified hydronephrosis: Secondary | ICD-10-CM | POA: Diagnosis not present

## 2019-01-04 LAB — CBC
HCT: 40.9 % (ref 39.0–52.0)
Hemoglobin: 13.8 g/dL (ref 13.0–17.0)
MCH: 31.7 pg (ref 26.0–34.0)
MCHC: 33.7 g/dL (ref 30.0–36.0)
MCV: 94 fL (ref 80.0–100.0)
Platelets: 142 10*3/uL — ABNORMAL LOW (ref 150–400)
RBC: 4.35 MIL/uL (ref 4.22–5.81)
RDW: 12.5 % (ref 11.5–15.5)
WBC: 8.4 10*3/uL (ref 4.0–10.5)
nRBC: 0 % (ref 0.0–0.2)

## 2019-01-04 LAB — BASIC METABOLIC PANEL
Anion gap: 11 (ref 5–15)
BUN: 26 mg/dL — ABNORMAL HIGH (ref 8–23)
CO2: 27 mmol/L (ref 22–32)
Calcium: 10.2 mg/dL (ref 8.9–10.3)
Chloride: 101 mmol/L (ref 98–111)
Creatinine, Ser: 1.92 mg/dL — ABNORMAL HIGH (ref 0.61–1.24)
GFR calc Af Amer: 40 mL/min — ABNORMAL LOW (ref >60)
GFR calc non Af Amer: 35 mL/min — ABNORMAL LOW (ref >60)
Glucose, Bld: 133 mg/dL — ABNORMAL HIGH (ref 70–99)
Potassium: 4 mmol/L (ref 3.5–5.1)
Sodium: 139 mmol/L (ref 135–145)

## 2019-01-04 LAB — URINALYSIS, ROUTINE W REFLEX MICROSCOPIC
Bilirubin Urine: NEGATIVE
Glucose, UA: NEGATIVE mg/dL
Ketones, ur: NEGATIVE mg/dL
Leukocytes,Ua: NEGATIVE
Nitrite: NEGATIVE
Protein, ur: 30 mg/dL — AB
RBC / HPF: >50 RBC/hpf — ABNORMAL HIGH (ref 0–5)
Specific Gravity, Urine: 1.008 (ref 1.005–1.030)
pH: 6 (ref 5.0–8.0)

## 2019-01-04 NOTE — ED Notes (Signed)
Pt finished water, did not want any more. Has not urinated yet.
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
## 2019-01-04 NOTE — ED Notes (Signed)
Pt verbalized understanding of discharge and follow up. Pt wheeled to lobby with pt caregiver.

## 2019-01-04 NOTE — ED Notes (Signed)
Pt transported to US

## 2019-01-04 NOTE — ED Triage Notes (Signed)
Pt reports bright red blood in his urine since yesterday, denies any abd/flank pain, no n.v.

## 2019-01-04 NOTE — Discharge Instructions (Addendum)
Your work-up today showed evidence of hematuria, blood in your urine.  Your kidney function was slightly worse than prior but did not reflect a significant acute kidney injury.  The CT scan did not show stones or obstruction.  Please improve your hydration to help with the kidney function and follow-up with urology for further evaluation.  We did not see infection but sent a culture.  If any symptoms change or worsen, please return to the nearest emergency department  Caregiver from the state was present at bedside and understood all plans and patient will be discharged.

## 2019-01-04 NOTE — ED Notes (Signed)
Pt returned from US

## 2019-01-04 NOTE — ED Notes (Signed)
Pt given a cup of water to drink. Pt encouraged to try to drink as much as he could.

## 2019-01-05 LAB — URINE CULTURE

## 2019-01-06 DIAGNOSIS — R350 Frequency of micturition: Secondary | ICD-10-CM | POA: Diagnosis not present

## 2019-01-06 DIAGNOSIS — I5022 Chronic systolic (congestive) heart failure: Secondary | ICD-10-CM | POA: Diagnosis not present

## 2019-01-06 DIAGNOSIS — F0390 Unspecified dementia without behavioral disturbance: Secondary | ICD-10-CM | POA: Diagnosis not present

## 2019-01-06 DIAGNOSIS — I130 Hypertensive heart and chronic kidney disease with heart failure and stage 1 through stage 4 chronic kidney disease, or unspecified chronic kidney disease: Secondary | ICD-10-CM | POA: Diagnosis not present

## 2019-01-06 DIAGNOSIS — R2689 Other abnormalities of gait and mobility: Secondary | ICD-10-CM | POA: Diagnosis not present

## 2019-01-06 DIAGNOSIS — N183 Chronic kidney disease, stage 3 (moderate): Secondary | ICD-10-CM | POA: Diagnosis not present

## 2019-01-06 DIAGNOSIS — N401 Enlarged prostate with lower urinary tract symptoms: Secondary | ICD-10-CM | POA: Diagnosis not present

## 2019-01-06 DIAGNOSIS — I272 Pulmonary hypertension, unspecified: Secondary | ICD-10-CM | POA: Diagnosis not present

## 2019-01-06 DIAGNOSIS — I69398 Other sequelae of cerebral infarction: Secondary | ICD-10-CM | POA: Diagnosis not present
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
## 2019-01-07 ENCOUNTER — Ambulatory Visit (INDEPENDENT_AMBULATORY_CARE_PROVIDER_SITE_OTHER): Payer: Medicare HMO | Admitting: Adult Health Nurse Practitioner

## 2019-01-07 ENCOUNTER — Other Ambulatory Visit: Payer: Self-pay

## 2019-01-07 ENCOUNTER — Encounter: Payer: Self-pay | Admitting: Adult Health Nurse Practitioner

## 2019-01-07 VITALS — BP 128/76 | HR 65 | Temp 97.3°F | Ht 66.5 in | Wt 141.0 lb

## 2019-01-07 DIAGNOSIS — R319 Hematuria, unspecified: Secondary | ICD-10-CM

## 2019-01-07 DIAGNOSIS — N183 Chronic kidney disease, stage 3 unspecified: Secondary | ICD-10-CM

## 2019-01-07 DIAGNOSIS — D6859 Other primary thrombophilia: Secondary | ICD-10-CM

## 2019-01-07 NOTE — Patient Instructions (Addendum)
  Today we will check a urine to make sure the blood has cleared.  We will contact you via phone with the results.  Continue to increase water intake.  Please contact the office if the blood in your urine returns.  OR if you have any new symptoms, abdominal pain, pain with urination, nausea or vomiting.   IF there is blood in your urine today we will consider going through with the referral for urology.

## 2019-01-08 DIAGNOSIS — I5022 Chronic systolic (congestive) heart failure: Secondary | ICD-10-CM | POA: Diagnosis not present

## 2019-01-08 DIAGNOSIS — R350 Frequency of micturition: Secondary | ICD-10-CM | POA: Diagnosis not present

## 2019-01-08 DIAGNOSIS — N401 Enlarged prostate with lower urinary tract symptoms: Secondary | ICD-10-CM | POA: Diagnosis not present

## 2019-01-08 DIAGNOSIS — I130 Hypertensive heart and chronic kidney disease with heart failure and stage 1 through stage 4 chronic kidney disease, or unspecified chronic kidney disease: Secondary | ICD-10-CM | POA: Diagnosis not present

## 2019-01-08 DIAGNOSIS — R2689 Other abnormalities of gait and mobility: Secondary | ICD-10-CM | POA: Diagnosis not present

## 2019-01-08 DIAGNOSIS — F0390 Unspecified dementia without behavioral disturbance: Secondary | ICD-10-CM | POA: Diagnosis not present

## 2019-01-08 DIAGNOSIS — N183 Chronic kidney disease, stage 3 (moderate): Secondary | ICD-10-CM | POA: Diagnosis not present

## 2019-01-08 DIAGNOSIS — I69398 Other sequelae of cerebral infarction: Secondary | ICD-10-CM | POA: Diagnosis not present

## 2019-01-08 DIAGNOSIS — I272 Pulmonary hypertension, unspecified: Secondary | ICD-10-CM | POA: Diagnosis not present

## 2019-01-09 ENCOUNTER — Encounter: Payer: Self-pay | Admitting: Adult Health Nurse Practitioner

## 2019-01-09 LAB — URINE CULTURE
MICRO NUMBER:: 813533
SPECIMEN QUALITY:: ADEQUATE

## 2019-01-09 LAB — URINALYSIS W MICROSCOPIC + REFLEX CULTURE
Bacteria, UA: NONE SEEN /HPF
Bilirubin Urine: NEGATIVE
Glucose, UA: NEGATIVE
Hgb urine dipstick: NEGATIVE
Hyaline Cast: NONE SEEN /LPF
Ketones, ur: NEGATIVE
Nitrites, Initial: NEGATIVE
Protein, ur: NEGATIVE
RBC / HPF: NONE SEEN /HPF (ref 0–2)
Specific Gravity, Urine: 1.012 (ref 1.001–1.03)
Squamous Epithelial / HPF: NONE SEEN /HPF (ref <=5)
pH: 5.5 (ref 5.0–8.0)

## 2019-01-09 LAB — CULTURE INDICATED

## 2019-01-13 DIAGNOSIS — N401 Enlarged prostate with lower urinary tract symptoms: Secondary | ICD-10-CM | POA: Diagnosis not present

## 2019-01-13 DIAGNOSIS — I272 Pulmonary hypertension, unspecified: Secondary | ICD-10-CM | POA: Diagnosis not present

## 2019-01-13 DIAGNOSIS — N183 Chronic kidney disease, stage 3 (moderate): Secondary | ICD-10-CM | POA: Diagnosis not present

## 2019-01-13 DIAGNOSIS — R350 Frequency of micturition: Secondary | ICD-10-CM | POA: Diagnosis not present

## 2019-01-13 DIAGNOSIS — I5022 Chronic systolic (congestive) heart failure: Secondary | ICD-10-CM | POA: Diagnosis not present

## 2019-01-13 DIAGNOSIS — R2689 Other abnormalities of gait and mobility: Secondary | ICD-10-CM | POA: Diagnosis not present

## 2019-01-13 DIAGNOSIS — F0390 Unspecified dementia without behavioral disturbance: Secondary | ICD-10-CM | POA: Diagnosis not present

## 2019-01-13 DIAGNOSIS — I130 Hypertensive heart and chronic kidney disease with heart failure and stage 1 through stage 4 chronic kidney disease, or unspecified chronic kidney disease: Secondary | ICD-10-CM | POA: Diagnosis not present

## 2019-01-13 DIAGNOSIS — I69398 Other sequelae of cerebral infarction: Secondary | ICD-10-CM | POA: Diagnosis not present

## 2019-01-15 DIAGNOSIS — I5022 Chronic systolic (congestive) heart failure: Secondary | ICD-10-CM | POA: Diagnosis not present

## 2019-01-15 DIAGNOSIS — F0390 Unspecified dementia without behavioral disturbance: Secondary | ICD-10-CM | POA: Diagnosis not present

## 2019-01-15 DIAGNOSIS — I69398 Other sequelae of cerebral infarction: Secondary | ICD-10-CM | POA: Diagnosis not present

## 2019-01-15 DIAGNOSIS — I272 Pulmonary hypertension, unspecified: Secondary | ICD-10-CM | POA: Diagnosis not present

## 2019-01-15 DIAGNOSIS — R2689 Other abnormalities of gait and mobility: Secondary | ICD-10-CM | POA: Diagnosis not present

## 2019-01-15 DIAGNOSIS — R350 Frequency of micturition: Secondary | ICD-10-CM | POA: Diagnosis not present

## 2019-01-15 DIAGNOSIS — N183 Chronic kidney disease, stage 3 (moderate): Secondary | ICD-10-CM | POA: Diagnosis not present

## 2019-01-15 DIAGNOSIS — I130 Hypertensive heart and chronic kidney disease with heart failure and stage 1 through stage 4 chronic kidney disease, or unspecified chronic kidney disease: Secondary | ICD-10-CM | POA: Diagnosis not present

## 2019-01-15 DIAGNOSIS — N401 Enlarged prostate with lower urinary tract symptoms: Secondary | ICD-10-CM | POA: Diagnosis not present

## 2019-01-22 DIAGNOSIS — N183 Chronic kidney disease, stage 3 unspecified: Secondary | ICD-10-CM | POA: Diagnosis not present

## 2019-01-22 DIAGNOSIS — I272 Pulmonary hypertension, unspecified: Secondary | ICD-10-CM | POA: Diagnosis not present

## 2019-01-22 DIAGNOSIS — F0390 Unspecified dementia without behavioral disturbance: Secondary | ICD-10-CM | POA: Diagnosis not present

## 2019-01-22 DIAGNOSIS — N401 Enlarged prostate with lower urinary tract symptoms: Secondary | ICD-10-CM | POA: Diagnosis not present

## 2019-01-22 DIAGNOSIS — R350 Frequency of micturition: Secondary | ICD-10-CM | POA: Diagnosis not present

## 2019-01-22 DIAGNOSIS — I130 Hypertensive heart and chronic kidney disease with heart failure and stage 1 through stage 4 chronic kidney disease, or unspecified chronic kidney disease: Secondary | ICD-10-CM | POA: Diagnosis not present

## 2019-01-22 DIAGNOSIS — I5022 Chronic systolic (congestive) heart failure: Secondary | ICD-10-CM | POA: Diagnosis not present

## 2019-01-22 DIAGNOSIS — I69398 Other sequelae of cerebral infarction: Secondary | ICD-10-CM | POA: Diagnosis not present

## 2019-01-22 DIAGNOSIS — R2689 Other abnormalities of gait and mobility: Secondary | ICD-10-CM | POA: Diagnosis not present

## 2019-01-27 DIAGNOSIS — N401 Enlarged prostate with lower urinary tract symptoms: Secondary | ICD-10-CM | POA: Diagnosis not present

## 2019-01-27 DIAGNOSIS — I130 Hypertensive heart and chronic kidney disease with heart failure and stage 1 through stage 4 chronic kidney disease, or unspecified chronic kidney disease: Secondary | ICD-10-CM | POA: Diagnosis not present

## 2019-01-27 DIAGNOSIS — I69398 Other sequelae of cerebral infarction: Secondary | ICD-10-CM | POA: Diagnosis not present

## 2019-01-27 DIAGNOSIS — I5022 Chronic systolic (congestive) heart failure: Secondary | ICD-10-CM | POA: Diagnosis not present

## 2019-01-27 DIAGNOSIS — R350 Frequency of micturition: Secondary | ICD-10-CM | POA: Diagnosis not present

## 2019-01-27 DIAGNOSIS — R2689 Other abnormalities of gait and mobility: Secondary | ICD-10-CM | POA: Diagnosis not present

## 2019-01-27 DIAGNOSIS — F0390 Unspecified dementia without behavioral disturbance: Secondary | ICD-10-CM | POA: Diagnosis not present

## 2019-01-27 DIAGNOSIS — N183 Chronic kidney disease, stage 3 unspecified: Secondary | ICD-10-CM | POA: Diagnosis not present

## 2019-01-27 DIAGNOSIS — I272 Pulmonary hypertension, unspecified: Secondary | ICD-10-CM | POA: Diagnosis not present

## 2019-02-03 DIAGNOSIS — I272 Pulmonary hypertension, unspecified: Secondary | ICD-10-CM | POA: Diagnosis not present

## 2019-02-03 DIAGNOSIS — N183 Chronic kidney disease, stage 3 unspecified: Secondary | ICD-10-CM | POA: Diagnosis not present

## 2019-02-03 DIAGNOSIS — I69398 Other sequelae of cerebral infarction: Secondary | ICD-10-CM | POA: Diagnosis not present

## 2019-02-03 DIAGNOSIS — R350 Frequency of micturition: Secondary | ICD-10-CM | POA: Diagnosis not present

## 2019-02-03 DIAGNOSIS — N401 Enlarged prostate with lower urinary tract symptoms: Secondary | ICD-10-CM | POA: Diagnosis not present

## 2019-02-03 DIAGNOSIS — F0390 Unspecified dementia without behavioral disturbance: Secondary | ICD-10-CM | POA: Diagnosis not present

## 2019-02-03 DIAGNOSIS — R2689 Other abnormalities of gait and mobility: Secondary | ICD-10-CM | POA: Diagnosis not present

## 2019-02-03 DIAGNOSIS — I5022 Chronic systolic (congestive) heart failure: Secondary | ICD-10-CM | POA: Diagnosis not present

## 2019-02-03 DIAGNOSIS — I130 Hypertensive heart and chronic kidney disease with heart failure and stage 1 through stage 4 chronic kidney disease, or unspecified chronic kidney disease: Secondary | ICD-10-CM | POA: Diagnosis not present

## 2019-02-11 DIAGNOSIS — I272 Pulmonary hypertension, unspecified: Secondary | ICD-10-CM | POA: Diagnosis not present

## 2019-02-11 DIAGNOSIS — N183 Chronic kidney disease, stage 3 unspecified: Secondary | ICD-10-CM | POA: Diagnosis not present

## 2019-02-11 DIAGNOSIS — N401 Enlarged prostate with lower urinary tract symptoms: Secondary | ICD-10-CM | POA: Diagnosis not present

## 2019-02-11 DIAGNOSIS — R350 Frequency of micturition: Secondary | ICD-10-CM | POA: Diagnosis not present

## 2019-02-11 DIAGNOSIS — R2689 Other abnormalities of gait and mobility: Secondary | ICD-10-CM | POA: Diagnosis not present

## 2019-02-11 DIAGNOSIS — F0390 Unspecified dementia without behavioral disturbance: Secondary | ICD-10-CM | POA: Diagnosis not present

## 2019-02-11 DIAGNOSIS — I5022 Chronic systolic (congestive) heart failure: Secondary | ICD-10-CM | POA: Diagnosis not present

## 2019-02-11 DIAGNOSIS — I69398 Other sequelae of cerebral infarction: Secondary | ICD-10-CM | POA: Diagnosis not present

## 2019-02-11 DIAGNOSIS — I130 Hypertensive heart and chronic kidney disease with heart failure and stage 1 through stage 4 chronic kidney disease, or unspecified chronic kidney disease: Secondary | ICD-10-CM | POA: Diagnosis not present

## 2019-02-17 DIAGNOSIS — I5022 Chronic systolic (congestive) heart failure: Secondary | ICD-10-CM | POA: Diagnosis not present

## 2019-02-17 DIAGNOSIS — I69398 Other sequelae of cerebral infarction: Secondary | ICD-10-CM | POA: Diagnosis not present

## 2019-02-17 DIAGNOSIS — R350 Frequency of micturition: Secondary | ICD-10-CM | POA: Diagnosis not present

## 2019-02-17 DIAGNOSIS — I130 Hypertensive heart and chronic kidney disease with heart failure and stage 1 through stage 4 chronic kidney disease, or unspecified chronic kidney disease: Secondary | ICD-10-CM | POA: Diagnosis not present

## 2019-02-17 DIAGNOSIS — N401 Enlarged prostate with lower urinary tract symptoms: Secondary | ICD-10-CM | POA: Diagnosis not present

## 2019-02-17 DIAGNOSIS — N183 Chronic kidney disease, stage 3 unspecified: Secondary | ICD-10-CM | POA: Diagnosis not present

## 2019-02-17 DIAGNOSIS — F0390 Unspecified dementia without behavioral disturbance: Secondary | ICD-10-CM | POA: Diagnosis not present

## 2019-02-17 DIAGNOSIS — I272 Pulmonary hypertension, unspecified: Secondary | ICD-10-CM | POA: Diagnosis not present

## 2019-02-17 DIAGNOSIS — R2689 Other abnormalities of gait and mobility: Secondary | ICD-10-CM | POA: Diagnosis not present

## 2019-02-20 ENCOUNTER — Other Ambulatory Visit: Payer: Self-pay

## 2019-02-20 ENCOUNTER — Encounter: Payer: Self-pay | Admitting: Internal Medicine

## 2019-02-20 ENCOUNTER — Ambulatory Visit (INDEPENDENT_AMBULATORY_CARE_PROVIDER_SITE_OTHER): Payer: Medicare HMO | Admitting: Internal Medicine

## 2019-02-20 VITALS — BP 126/74 | HR 62 | Temp 97.6°F | Resp 16 | Ht 66.5 in | Wt 147.0 lb

## 2019-02-20 DIAGNOSIS — E559 Vitamin D deficiency, unspecified: Secondary | ICD-10-CM

## 2019-02-20 DIAGNOSIS — Z79899 Other long term (current) drug therapy: Secondary | ICD-10-CM

## 2019-02-20 DIAGNOSIS — R7303 Prediabetes: Secondary | ICD-10-CM | POA: Diagnosis not present

## 2019-02-20 DIAGNOSIS — Z23 Encounter for immunization: Secondary | ICD-10-CM

## 2019-02-20 DIAGNOSIS — E782 Mixed hyperlipidemia: Secondary | ICD-10-CM | POA: Diagnosis not present

## 2019-02-20 DIAGNOSIS — I5042 Chronic combined systolic (congestive) and diastolic (congestive) heart failure: Secondary | ICD-10-CM

## 2019-02-20 DIAGNOSIS — Z136 Encounter for screening for cardiovascular disorders: Secondary | ICD-10-CM

## 2019-02-20 DIAGNOSIS — Z8249 Family history of ischemic heart disease and other diseases of the circulatory system: Secondary | ICD-10-CM | POA: Diagnosis not present

## 2019-02-20 DIAGNOSIS — Z1211 Encounter for screening for malignant neoplasm of colon: Secondary | ICD-10-CM

## 2019-02-20 DIAGNOSIS — F0390 Unspecified dementia without behavioral disturbance: Secondary | ICD-10-CM

## 2019-02-20 DIAGNOSIS — I110 Hypertensive heart disease with heart failure: Secondary | ICD-10-CM

## 2019-02-20 DIAGNOSIS — I10 Essential (primary) hypertension: Secondary | ICD-10-CM

## 2019-02-20 DIAGNOSIS — N138 Other obstructive and reflux uropathy: Secondary | ICD-10-CM

## 2019-02-20 DIAGNOSIS — Z125 Encounter for screening for malignant neoplasm of prostate: Secondary | ICD-10-CM

## 2019-02-20 DIAGNOSIS — Z0000 Encounter for general adult medical examination without abnormal findings: Secondary | ICD-10-CM | POA: Diagnosis not present

## 2019-02-20 DIAGNOSIS — Z0001 Encounter for general adult medical examination with abnormal findings: Secondary | ICD-10-CM

## 2019-02-20 DIAGNOSIS — R7309 Other abnormal glucose: Secondary | ICD-10-CM | POA: Diagnosis not present

## 2019-02-20 DIAGNOSIS — N401 Enlarged prostate with lower urinary tract symptoms: Secondary | ICD-10-CM | POA: Diagnosis not present

## 2019-02-20 DIAGNOSIS — M1000 Idiopathic gout, unspecified site: Secondary | ICD-10-CM

## 2019-02-20 DIAGNOSIS — F03A0 Unspecified dementia, mild, without behavioral disturbance, psychotic disturbance, mood disturbance, and anxiety: Secondary | ICD-10-CM

## 2019-02-20 NOTE — Patient Instructions (Signed)
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
## 2019-02-21 LAB — LIPID PANEL
Cholesterol: 129 mg/dL (ref <200)
HDL: 54 mg/dL (ref >=40)
LDL Cholesterol (Calc): 57 mg/dL (calc)
Non-HDL Cholesterol (Calc): 75 mg/dL (calc) (ref <130)
Total CHOL/HDL Ratio: 2.4 (calc) (ref <5.0)
Triglycerides: 94 mg/dL (ref <150)

## 2019-02-21 LAB — CBC WITH DIFFERENTIAL/PLATELET
Absolute Monocytes: 842 cells/uL (ref 200–950)
Basophils Absolute: 31 cells/uL (ref 0–200)
Basophils Relative: 0.4 %
Eosinophils Absolute: 218 cells/uL (ref 15–500)
Eosinophils Relative: 2.8 %
HCT: 38.2 % — ABNORMAL LOW (ref 38.5–50.0)
Hemoglobin: 12.7 g/dL — ABNORMAL LOW (ref 13.2–17.1)
Lymphs Abs: 1739 cells/uL (ref 850–3900)
MCH: 31.5 pg (ref 27.0–33.0)
MCHC: 33.2 g/dL (ref 32.0–36.0)
MCV: 94.8 fL (ref 80.0–100.0)
MPV: 10.6 fL (ref 7.5–12.5)
Monocytes Relative: 10.8 %
Neutro Abs: 4969 cells/uL (ref 1500–7800)
Neutrophils Relative %: 63.7 %
Platelets: 135 10*3/uL — ABNORMAL LOW (ref 140–400)
RBC: 4.03 10*6/uL — ABNORMAL LOW (ref 4.20–5.80)
RDW: 12.9 % (ref 11.0–15.0)
Total Lymphocyte: 22.3 %
WBC: 7.8 10*3/uL (ref 3.8–10.8)

## 2019-02-21 LAB — URINALYSIS, ROUTINE W REFLEX MICROSCOPIC
Bilirubin Urine: NEGATIVE
Glucose, UA: NEGATIVE
Hgb urine dipstick: NEGATIVE
Ketones, ur: NEGATIVE
Leukocytes,Ua: NEGATIVE
Nitrite: NEGATIVE
Protein, ur: NEGATIVE
Specific Gravity, Urine: 1.016 (ref 1.001–1.03)
pH: <=5 (ref 5.0–8.0)

## 2019-02-21 LAB — COMPLETE METABOLIC PANEL WITH GFR
AG Ratio: 1.7 (calc) (ref 1.0–2.5)
ALT: 6 U/L — ABNORMAL LOW (ref 9–46)
AST: 14 U/L (ref 10–35)
Albumin: 4 g/dL (ref 3.6–5.1)
Alkaline phosphatase (APISO): 57 U/L (ref 35–144)
BUN/Creatinine Ratio: 17 (calc) (ref 6–22)
BUN: 24 mg/dL (ref 7–25)
CO2: 28 mmol/L (ref 20–32)
Calcium: 9.6 mg/dL (ref 8.6–10.3)
Chloride: 107 mmol/L (ref 98–110)
Creat: 1.45 mg/dL — ABNORMAL HIGH (ref 0.70–1.18)
GFR, Est African American: 56 mL/min/{1.73_m2} — ABNORMAL LOW (ref >=60)
GFR, Est Non African American: 48 mL/min/{1.73_m2} — ABNORMAL LOW (ref >=60)
Globulin: 2.4 g/dL (calc) (ref 1.9–3.7)
Glucose, Bld: 90 mg/dL (ref 65–99)
Potassium: 4.2 mmol/L (ref 3.5–5.3)
Sodium: 141 mmol/L (ref 135–146)
Total Bilirubin: 0.7 mg/dL (ref 0.2–1.2)
Total Protein: 6.4 g/dL (ref 6.1–8.1)

## 2019-02-21 LAB — VITAMIN D 25 HYDROXY (VIT D DEFICIENCY, FRACTURES): Vit D, 25-Hydroxy: 101 ng/mL — ABNORMAL HIGH (ref 30–100)

## 2019-02-21 LAB — HEMOGLOBIN A1C
Hgb A1c MFr Bld: 5.3 % of total Hgb (ref <5.7)
Mean Plasma Glucose: 105 (calc)
eAG (mmol/L): 5.8 (calc)

## 2019-02-21 LAB — PSA: PSA: 1.1 ng/mL (ref <=4.0)

## 2019-02-21 LAB — INSULIN, RANDOM: Insulin: 7.1 u[IU]/mL

## 2019-02-21 LAB — TSH: TSH: 0.94 mIU/L (ref 0.40–4.50)

## 2019-02-21 LAB — URIC ACID: Uric Acid, Serum: 5.9 mg/dL (ref 4.0–8.0)

## 2019-02-21 LAB — MICROALBUMIN / CREATININE URINE RATIO
Creatinine, Urine: 124 mg/dL (ref 20–320)
Microalb Creat Ratio: 2 mcg/mg creat (ref <30)
Microalb, Ur: 0.3 mg/dL

## 2019-02-21 LAB — MAGNESIUM: Magnesium: 2 mg/dL (ref 1.5–2.5)

## 2019-02-22 ENCOUNTER — Encounter: Payer: Self-pay | Admitting: Internal Medicine

## 2019-02-25 DIAGNOSIS — N401 Enlarged prostate with lower urinary tract symptoms: Secondary | ICD-10-CM | POA: Diagnosis not present

## 2019-02-25 DIAGNOSIS — R350 Frequency of micturition: Secondary | ICD-10-CM | POA: Diagnosis not present

## 2019-02-25 DIAGNOSIS — I5022 Chronic systolic (congestive) heart failure: Secondary | ICD-10-CM | POA: Diagnosis not present

## 2019-02-25 DIAGNOSIS — I130 Hypertensive heart and chronic kidney disease with heart failure and stage 1 through stage 4 chronic kidney disease, or unspecified chronic kidney disease: Secondary | ICD-10-CM | POA: Diagnosis not present

## 2019-02-25 DIAGNOSIS — F0390 Unspecified dementia without behavioral disturbance: Secondary | ICD-10-CM | POA: Diagnosis not present

## 2019-02-25 DIAGNOSIS — I69398 Other sequelae of cerebral infarction: Secondary | ICD-10-CM | POA: Diagnosis not present

## 2019-02-25 DIAGNOSIS — R2689 Other abnormalities of gait and mobility: Secondary | ICD-10-CM | POA: Diagnosis not present

## 2019-02-25 DIAGNOSIS — N183 Chronic kidney disease, stage 3 unspecified: Secondary | ICD-10-CM | POA: Diagnosis not present

## 2019-02-25 DIAGNOSIS — I272 Pulmonary hypertension, unspecified: Secondary | ICD-10-CM | POA: Diagnosis not present

## 2019-05-23 ENCOUNTER — Other Ambulatory Visit: Payer: Self-pay | Admitting: Physician Assistant

## 2019-05-23 DIAGNOSIS — Z79899 Other long term (current) drug therapy: Secondary | ICD-10-CM

## 2019-05-23 DIAGNOSIS — E782 Mixed hyperlipidemia: Secondary | ICD-10-CM
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
## 2019-05-29 ENCOUNTER — Ambulatory Visit: Payer: Medicare HMO | Admitting: Adult Health

## 2019-05-29 ENCOUNTER — Other Ambulatory Visit: Payer: Self-pay | Admitting: Physician Assistant

## 2019-05-29 DIAGNOSIS — I10 Essential (primary) hypertension: Secondary | ICD-10-CM
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
## 2019-06-02 ENCOUNTER — Encounter: Payer: Self-pay | Admitting: Adult Health

## 2019-06-02 ENCOUNTER — Ambulatory Visit (INDEPENDENT_AMBULATORY_CARE_PROVIDER_SITE_OTHER): Payer: Medicare HMO | Admitting: Adult Health

## 2019-06-02 ENCOUNTER — Other Ambulatory Visit: Payer: Self-pay

## 2019-06-02 VITALS — BP 120/72 | HR 73 | Temp 97.5°F | Wt 146.0 lb

## 2019-06-02 DIAGNOSIS — E559 Vitamin D deficiency, unspecified: Secondary | ICD-10-CM

## 2019-06-02 DIAGNOSIS — N1831 Chronic kidney disease, stage 3a: Secondary | ICD-10-CM

## 2019-06-02 DIAGNOSIS — Z0001 Encounter for general adult medical examination with abnormal findings: Secondary | ICD-10-CM | POA: Diagnosis not present

## 2019-06-02 DIAGNOSIS — D6859 Other primary thrombophilia: Secondary | ICD-10-CM | POA: Diagnosis not present

## 2019-06-02 DIAGNOSIS — Z86718 Personal history of other venous thrombosis and embolism: Secondary | ICD-10-CM

## 2019-06-02 DIAGNOSIS — F0390 Unspecified dementia without behavioral disturbance: Secondary | ICD-10-CM | POA: Diagnosis not present

## 2019-06-02 DIAGNOSIS — M545 Low back pain, unspecified: Secondary | ICD-10-CM

## 2019-06-02 DIAGNOSIS — I272 Pulmonary hypertension, unspecified: Secondary | ICD-10-CM

## 2019-06-02 DIAGNOSIS — G8929 Other chronic pain: Secondary | ICD-10-CM

## 2019-06-02 DIAGNOSIS — Z86711 Personal history of pulmonary embolism: Secondary | ICD-10-CM

## 2019-06-02 DIAGNOSIS — Z8673 Personal history of transient ischemic attack (TIA), and cerebral infarction without residual deficits: Secondary | ICD-10-CM

## 2019-06-02 DIAGNOSIS — F411 Generalized anxiety disorder: Secondary | ICD-10-CM

## 2019-06-02 DIAGNOSIS — R6889 Other general symptoms and signs: Secondary | ICD-10-CM | POA: Diagnosis not present

## 2019-06-02 DIAGNOSIS — F03A0 Unspecified dementia, mild, without behavioral disturbance, psychotic disturbance, mood disturbance, and anxiety: Secondary | ICD-10-CM

## 2019-06-02 DIAGNOSIS — Z9181 History of falling: Secondary | ICD-10-CM

## 2019-06-02 DIAGNOSIS — R7309 Other abnormal glucose: Secondary | ICD-10-CM | POA: Diagnosis not present

## 2019-06-02 DIAGNOSIS — E782 Mixed hyperlipidemia: Secondary | ICD-10-CM | POA: Diagnosis not present

## 2019-06-02 DIAGNOSIS — Z0000 Encounter for general adult medical examination without abnormal findings: Secondary | ICD-10-CM

## 2019-06-02 DIAGNOSIS — I5022 Chronic systolic (congestive) heart failure: Secondary | ICD-10-CM

## 2019-06-02 DIAGNOSIS — Z79899 Other long term (current) drug therapy: Secondary | ICD-10-CM | POA: Diagnosis not present

## 2019-06-02 DIAGNOSIS — I110 Hypertensive heart disease with heart failure: Secondary | ICD-10-CM

## 2019-06-02 DIAGNOSIS — I10 Essential (primary) hypertension: Secondary | ICD-10-CM | POA: Diagnosis not present

## 2019-06-02 DIAGNOSIS — M1000 Idiopathic gout, unspecified site: Secondary | ICD-10-CM

## 2019-06-02 DIAGNOSIS — N401 Enlarged prostate with lower urinary tract symptoms: Secondary | ICD-10-CM

## 2019-06-02 DIAGNOSIS — R350 Frequency of micturition: Secondary | ICD-10-CM

## 2019-06-02 MED ORDER — SERTRALINE HCL 100 MG PO TABS: ORAL_TABLET | ORAL | 1 refills | Status: DC

## 2019-06-02 NOTE — Patient Instructions (Addendum)
  [name] , Thank you for taking time to come for your Medicare Wellness Visit. I appreciate your ongoing commitment to your health goals. Please review the following plan we discussed and let me know if I can assist you in the future.   These are the goals we discussed: Goals    . Blood Pressure < 120/80     Monitor BP daily Continue medications.     . Exercise 150 min/wk Moderate Activity per week     . Record weight daily     Monitor weight daily Will call if a gain or loss of 5 lbs in a day       This is a list of the screening recommended for you and due dates:  Health Maintenance  Topic Date Due  . Colon Cancer Screening  03/26/1998  . Tetanus Vaccine  11/22/2021  . Flu Shot  Completed  .  Hepatitis C: One time screening is recommended by Center for Disease Control  (CDC) for  adults born from [birth year] through 1963.   Completed  . Pneumonia vaccines  Completed    When you run out of sertraline/zoloft 50 mg - call back for refill and ask that we send in the 100 mg tabs to switch   Take 2 x 50 mg until you run out   Eye doctors that you can call, they are all very close to our office   Dr. Emily Filbert [phone] Dr. Hazle Quant [phone] Dr. Elmer Picker [phone]     Take extra strength tylenol 1-2 tabs up to three times a day as needed for pain

## 2019-06-03 LAB — LIPID PANEL
Cholesterol: 136 mg/dL (ref <200)
HDL: 59 mg/dL (ref >=40)
LDL Cholesterol (Calc): 58 mg/dL (calc)
Non-HDL Cholesterol (Calc): 77 mg/dL (calc) (ref <130)
Total CHOL/HDL Ratio: 2.3 (calc) (ref <5.0)
Triglycerides: 108 mg/dL (ref <150)

## 2019-06-03 LAB — CBC WITH DIFFERENTIAL/PLATELET
Absolute Monocytes: 821 cells/uL (ref 200–950)
Basophils Absolute: 38 cells/uL (ref 0–200)
Basophils Relative: 0.5 %
Eosinophils Absolute: 190 cells/uL (ref 15–500)
Eosinophils Relative: 2.5 %
HCT: 42.7 % (ref 38.5–50.0)
Hemoglobin: 14.3 g/dL (ref 13.2–17.1)
Lymphs Abs: 1642 cells/uL (ref 850–3900)
MCH: 30.9 pg (ref 27.0–33.0)
MCHC: 33.5 g/dL (ref 32.0–36.0)
MCV: 92.2 fL (ref 80.0–100.0)
MPV: 10.9 fL (ref 7.5–12.5)
Monocytes Relative: 10.8 %
Neutro Abs: 4910 cells/uL (ref 1500–7800)
Neutrophils Relative %: 64.6 %
Platelets: 135 10*3/uL — ABNORMAL LOW (ref 140–400)
RBC: 4.63 10*6/uL (ref 4.20–5.80)
RDW: 13.4 % (ref 11.0–15.0)
Total Lymphocyte: 21.6 %
WBC: 7.6 10*3/uL (ref 3.8–10.8)

## 2019-06-03 LAB — COMPLETE METABOLIC PANEL WITH GFR
AG Ratio: 1.6 (calc) (ref 1.0–2.5)
ALT: 9 U/L (ref 9–46)
AST: 13 U/L (ref 10–35)
Albumin: 4.2 g/dL (ref 3.6–5.1)
Alkaline phosphatase (APISO): 59 U/L (ref 35–144)
BUN/Creatinine Ratio: 21 (calc) (ref 6–22)
BUN: 29 mg/dL — ABNORMAL HIGH (ref 7–25)
CO2: 30 mmol/L (ref 20–32)
Calcium: 9.9 mg/dL (ref 8.6–10.3)
Chloride: 105 mmol/L (ref 98–110)
Creat: 1.36 mg/dL — ABNORMAL HIGH (ref 0.70–1.18)
GFR, Est African American: 60 mL/min/{1.73_m2} (ref >=60)
GFR, Est Non African American: 52 mL/min/{1.73_m2} — ABNORMAL LOW (ref >=60)
Globulin: 2.7 g/dL (calc) (ref 1.9–3.7)
Glucose, Bld: 91 mg/dL (ref 65–99)
Potassium: 5 mmol/L (ref 3.5–5.3)
Sodium: 141 mmol/L (ref 135–146)
Total Bilirubin: 0.7 mg/dL (ref 0.2–1.2)
Total Protein: 6.9 g/dL (ref 6.1–8.1)

## 2019-06-03 LAB — MAGNESIUM: Magnesium: 2.2 mg/dL (ref 1.5–2.5)

## 2019-06-03 LAB — TSH: TSH: 1.81 mIU/L (ref 0.40–4.50)

## 2019-08-07 ENCOUNTER — Other Ambulatory Visit: Payer: Self-pay | Admitting: Physician Assistant

## 2019-08-11 DIAGNOSIS — M25551 Pain in right hip: Secondary | ICD-10-CM | POA: Diagnosis not present

## 2019-08-11 DIAGNOSIS — M545 Low back pain: Secondary | ICD-10-CM | POA: Diagnosis not present

## 2019-08-19 DIAGNOSIS — M4306 Spondylolysis, lumbar region: Secondary | ICD-10-CM | POA: Diagnosis not present

## 2019-08-19 DIAGNOSIS — M4126 Other idiopathic scoliosis, lumbar region: Secondary | ICD-10-CM | POA: Diagnosis not present

## 2019-08-21 ENCOUNTER — Other Ambulatory Visit: Payer: Self-pay | Admitting: Physician Assistant

## 2019-08-21 DIAGNOSIS — F03A0 Unspecified dementia, mild, without behavioral disturbance, psychotic disturbance, mood disturbance, and anxiety: Secondary | ICD-10-CM

## 2019-08-21 DIAGNOSIS — F0390 Unspecified dementia without behavioral disturbance: Secondary | ICD-10-CM

## 2019-08-21 DIAGNOSIS — N401 Enlarged prostate with lower urinary tract symptoms: Secondary | ICD-10-CM

## 2019-08-28 DIAGNOSIS — M4306 Spondylolysis, lumbar region: Secondary | ICD-10-CM | POA: Diagnosis not present

## 2019-08-28 DIAGNOSIS — M4126 Other idiopathic scoliosis, lumbar region: Secondary | ICD-10-CM | POA: Diagnosis not present

## 2019-09-01 DIAGNOSIS — M4306 Spondylolysis, lumbar region: Secondary | ICD-10-CM | POA: Diagnosis not present

## 2019-09-01 DIAGNOSIS — M4126 Other idiopathic scoliosis, lumbar region: Secondary | ICD-10-CM | POA: Diagnosis not present

## 2019-09-04 DIAGNOSIS — M4126 Other idiopathic scoliosis, lumbar region: Secondary | ICD-10-CM | POA: Diagnosis not present

## 2019-09-04 DIAGNOSIS — M4306 Spondylolysis, lumbar region: Secondary | ICD-10-CM | POA: Diagnosis not present

## 2019-09-08 ENCOUNTER — Ambulatory Visit: Payer: Medicare HMO | Admitting: Internal Medicine

## 2019-09-08 ENCOUNTER — Encounter: Payer: Self-pay | Admitting: Internal Medicine

## 2019-09-08 NOTE — Patient Instructions (Signed)
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
## 2019-09-09 ENCOUNTER — Other Ambulatory Visit: Payer: Self-pay

## 2019-09-09 ENCOUNTER — Ambulatory Visit (INDEPENDENT_AMBULATORY_CARE_PROVIDER_SITE_OTHER): Payer: Medicare HMO | Admitting: Internal Medicine

## 2019-09-09 VITALS — BP 116/76 | HR 64 | Temp 97.0°F | Resp 16 | Ht 66.5 in | Wt 140.3 lb

## 2019-09-09 DIAGNOSIS — E559 Vitamin D deficiency, unspecified: Secondary | ICD-10-CM

## 2019-09-09 DIAGNOSIS — I10 Essential (primary) hypertension: Secondary | ICD-10-CM

## 2019-09-09 DIAGNOSIS — F03A0 Unspecified dementia, mild, without behavioral disturbance, psychotic disturbance, mood disturbance, and anxiety: Secondary | ICD-10-CM

## 2019-09-09 DIAGNOSIS — R7309 Other abnormal glucose: Secondary | ICD-10-CM | POA: Diagnosis not present

## 2019-09-09 DIAGNOSIS — E782 Mixed hyperlipidemia: Secondary | ICD-10-CM | POA: Diagnosis not present

## 2019-09-09 DIAGNOSIS — Z79899 Other long term (current) drug therapy: Secondary | ICD-10-CM | POA: Diagnosis not present

## 2019-09-09 DIAGNOSIS — F0390 Unspecified dementia without behavioral disturbance: Secondary | ICD-10-CM | POA: Diagnosis not present

## 2019-09-09 MED ORDER — GABAPENTIN 100 MG PO CAPS: ORAL_CAPSULE | ORAL | 0 refills | Status: DC

## 2019-09-09 NOTE — Addendum Note (Signed)
Addended by: Lucky Cowboy on: 09/09/2019 12:53 PM   Modules accepted: Orders

## 2019-09-10 LAB — CBC WITH DIFFERENTIAL/PLATELET
Absolute Monocytes: 733 cells/uL (ref 200–950)
Basophils Absolute: 31 cells/uL (ref 0–200)
Basophils Relative: 0.4 %
Eosinophils Absolute: 242 cells/uL (ref 15–500)
Eosinophils Relative: 3.1 %
HCT: 41.7 % (ref 38.5–50.0)
Hemoglobin: 14 g/dL (ref 13.2–17.1)
Lymphs Abs: 1521 cells/uL (ref 850–3900)
MCH: 31.9 pg (ref 27.0–33.0)
MCHC: 33.6 g/dL (ref 32.0–36.0)
MCV: 95 fL (ref 80.0–100.0)
MPV: 10.8 fL (ref 7.5–12.5)
Monocytes Relative: 9.4 %
Neutro Abs: 5273 cells/uL (ref 1500–7800)
Neutrophils Relative %: 67.6 %
Platelets: 132 10*3/uL — ABNORMAL LOW (ref 140–400)
RBC: 4.39 10*6/uL (ref 4.20–5.80)
RDW: 12.9 % (ref 11.0–15.0)
Total Lymphocyte: 19.5 %
WBC: 7.8 10*3/uL (ref 3.8–10.8)

## 2019-09-10 LAB — LIPID PANEL
Cholesterol: 154 mg/dL (ref <200)
HDL: 62 mg/dL (ref >=40)
LDL Cholesterol (Calc): 77 mg/dL (calc)
Non-HDL Cholesterol (Calc): 92 mg/dL (calc) (ref <130)
Total CHOL/HDL Ratio: 2.5 (calc) (ref <5.0)
Triglycerides: 74 mg/dL (ref <150)

## 2019-09-10 LAB — COMPLETE METABOLIC PANEL WITH GFR
AG Ratio: 1.7 (calc) (ref 1.0–2.5)
ALT: 7 U/L — ABNORMAL LOW (ref 9–46)
AST: 13 U/L (ref 10–35)
Albumin: 4.3 g/dL (ref 3.6–5.1)
Alkaline phosphatase (APISO): 62 U/L (ref 35–144)
BUN/Creatinine Ratio: 20 (calc) (ref 6–22)
BUN: 31 mg/dL — ABNORMAL HIGH (ref 7–25)
CO2: 32 mmol/L (ref 20–32)
Calcium: 10.2 mg/dL (ref 8.6–10.3)
Chloride: 104 mmol/L (ref 98–110)
Creat: 1.56 mg/dL — ABNORMAL HIGH (ref 0.70–1.18)
GFR, Est African American: 51 mL/min/{1.73_m2} — ABNORMAL LOW (ref >=60)
GFR, Est Non African American: 44 mL/min/{1.73_m2} — ABNORMAL LOW (ref >=60)
Globulin: 2.6 g/dL (calc) (ref 1.9–3.7)
Glucose, Bld: 123 mg/dL — ABNORMAL HIGH (ref 65–99)
Potassium: 4 mmol/L (ref 3.5–5.3)
Sodium: 143 mmol/L (ref 135–146)
Total Bilirubin: 0.6 mg/dL (ref 0.2–1.2)
Total Protein: 6.9 g/dL (ref 6.1–8.1)

## 2019-09-10 LAB — HEMOGLOBIN A1C
Hgb A1c MFr Bld: 5.7 % of total Hgb — ABNORMAL HIGH (ref <5.7)
Mean Plasma Glucose: 117 (calc)
eAG (mmol/L): 6.5 (calc)

## 2019-09-10 LAB — MAGNESIUM: Magnesium: 2.2 mg/dL (ref 1.5–2.5)

## 2019-09-10 LAB — VITAMIN D 25 HYDROXY (VIT D DEFICIENCY, FRACTURES): Vit D, 25-Hydroxy: 85 ng/mL (ref 30–100)

## 2019-09-10 LAB — INSULIN, RANDOM: Insulin: 31.7 u[IU]/mL — ABNORMAL HIGH

## 2019-09-10 LAB — TSH: TSH: 1.41 mIU/L (ref 0.40–4.50)

## 2019-09-11 DIAGNOSIS — M4126 Other idiopathic scoliosis, lumbar region: Secondary | ICD-10-CM | POA: Diagnosis not present

## 2019-09-11 DIAGNOSIS — M4306 Spondylolysis, lumbar region: Secondary | ICD-10-CM | POA: Diagnosis not present

## 2019-09-15 DIAGNOSIS — M4306 Spondylolysis, lumbar region: Secondary | ICD-10-CM | POA: Diagnosis not present

## 2019-09-15 DIAGNOSIS — M4126 Other idiopathic scoliosis, lumbar region: Secondary | ICD-10-CM | POA: Diagnosis not present

## 2019-11-27 ENCOUNTER — Other Ambulatory Visit: Payer: Self-pay | Admitting: Internal Medicine

## 2019-11-27 DIAGNOSIS — N401 Enlarged prostate with lower urinary tract symptoms: Secondary | ICD-10-CM

## 2019-12-11 ENCOUNTER — Ambulatory Visit (INDEPENDENT_AMBULATORY_CARE_PROVIDER_SITE_OTHER): Payer: Medicare HMO | Admitting: Adult Health

## 2019-12-11 ENCOUNTER — Encounter: Payer: Self-pay | Admitting: Adult Health

## 2019-12-11 ENCOUNTER — Other Ambulatory Visit: Payer: Self-pay

## 2019-12-11 VITALS — BP 110/60 | HR 50 | Temp 97.5°F | Wt 139.0 lb

## 2019-12-11 DIAGNOSIS — N1831 Chronic kidney disease, stage 3a: Secondary | ICD-10-CM | POA: Diagnosis not present

## 2019-12-11 DIAGNOSIS — E559 Vitamin D deficiency, unspecified: Secondary | ICD-10-CM | POA: Diagnosis not present

## 2019-12-11 DIAGNOSIS — F0390 Unspecified dementia without behavioral disturbance: Secondary | ICD-10-CM

## 2019-12-11 DIAGNOSIS — Z86718 Personal history of other venous thrombosis and embolism: Secondary | ICD-10-CM

## 2019-12-11 DIAGNOSIS — Z79899 Other long term (current) drug therapy: Secondary | ICD-10-CM | POA: Diagnosis not present

## 2019-12-11 DIAGNOSIS — I10 Essential (primary) hypertension: Secondary | ICD-10-CM

## 2019-12-11 DIAGNOSIS — R7309 Other abnormal glucose: Secondary | ICD-10-CM

## 2019-12-11 DIAGNOSIS — I110 Hypertensive heart disease with heart failure: Secondary | ICD-10-CM | POA: Diagnosis not present

## 2019-12-11 DIAGNOSIS — Z86711 Personal history of pulmonary embolism: Secondary | ICD-10-CM

## 2019-12-11 DIAGNOSIS — I5022 Chronic systolic (congestive) heart failure: Secondary | ICD-10-CM | POA: Diagnosis not present

## 2019-12-11 DIAGNOSIS — E782 Mixed hyperlipidemia: Secondary | ICD-10-CM | POA: Diagnosis not present

## 2019-12-11 DIAGNOSIS — Z6822 Body mass index (BMI) 22.0-22.9, adult: Secondary | ICD-10-CM

## 2019-12-11 DIAGNOSIS — I272 Pulmonary hypertension, unspecified: Secondary | ICD-10-CM | POA: Diagnosis not present

## 2019-12-11 DIAGNOSIS — D6859 Other primary thrombophilia: Secondary | ICD-10-CM

## 2019-12-11 DIAGNOSIS — F03A0 Unspecified dementia, mild, without behavioral disturbance, psychotic disturbance, mood disturbance, and anxiety: Secondary | ICD-10-CM
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
## 2019-12-11 NOTE — Patient Instructions (Addendum)
Goals    . Blood Pressure < 120/80     Monitor BP daily Continue medications.     . Exercise 150 min/wk Moderate Activity per week     . Record weight daily     Monitor weight daily Will call if a gain or loss of 5 lbs in a day       High-Fiber Diet Fiber, also called dietary fiber, is a type of carbohydrate that is found in fruits, vegetables, whole grains, and beans. A high-fiber diet can have many health benefits. Your health care provider may recommend a high-fiber diet to help:  Prevent constipation. Fiber can make your bowel movements more regular.  Lower your cholesterol.  Relieve the following conditions: ? Swelling of veins in the anus (hemorrhoids). ? Swelling and irritation (inflammation) of specific areas of the digestive tract (uncomplicated diverticulosis). ? A problem of the large intestine (colon) that sometimes causes pain and diarrhea (irritable bowel syndrome, IBS).  Prevent overeating as part of a weight-loss plan.  Prevent heart disease, type 2 diabetes, and certain cancers. What is my plan? The recommended daily fiber intake in grams (g) includes:  38 g for men age 22 or younger.  30 g for men over age 53.  25 g for women age 35 or younger.  21 g for women over age 76. You can get the recommended daily intake of dietary fiber by:  Eating a variety of fruits, vegetables, grains, and beans.  Taking a fiber supplement, if it is not possible to get enough fiber through your diet. What do I need to know about a high-fiber diet?  It is better to get fiber through food sources rather than from fiber supplements. There is not a lot of research about how effective supplements are.  Always check the fiber content on the nutrition facts label of any prepackaged food. Look for foods that contain 5 g of fiber or more per serving.  Talk with a diet and nutrition specialist (dietitian) if you have questions about specific foods that are recommended or not  recommended for your medical condition, especially if those foods are not listed below.  Gradually increase how much fiber you consume. If you increase your intake of dietary fiber too quickly, you may have bloating, cramping, or gas.  Drink plenty of water. Water helps you to digest fiber. What are tips for following this plan?  Eat a wide variety of high-fiber foods.  Make sure that half of the grains that you eat each day are whole grains.  Eat breads and cereals that are made with whole-grain flour instead of refined flour or white flour.  Eat brown rice, bulgur wheat, or millet instead of white rice.  Start the day with a breakfast that is high in fiber, such as a cereal that contains 5 g of fiber or more per serving.  Use beans in place of meat in soups, salads, and pasta dishes.  Eat high-fiber snacks, such as berries, raw vegetables, nuts, and popcorn.  Choose whole fruits and vegetables instead of processed forms like juice or sauce. What foods can I eat?  Fruits Berries. Pears. Apples. Oranges. Avocado. Prunes and raisins. Dried figs. Vegetables Sweet potatoes. Spinach. Kale. Artichokes. Cabbage. Broccoli. Cauliflower. Green peas. Carrots. Squash. Grains Whole-grain breads. Multigrain cereal. Oats and oatmeal. Brown rice. Barley. Bulgur wheat. Millet. Quinoa. Bran muffins. Popcorn. Rye wafer crackers. Meats and other proteins Navy, kidney, and pinto beans. Soybeans. Split peas. Lentils. Nuts and seeds. Dairy Fiber-fortified  yogurt. Beverages Fiber-fortified soy milk. Fiber-fortified orange juice. Other foods Fiber bars. The items listed above may not be a complete list of recommended foods and beverages. Contact a dietitian for more options. What foods are not recommended? Fruits Fruit juice. Cooked, strained fruit. Vegetables Fried potatoes. Canned vegetables. Well-cooked vegetables. Grains White bread. Pasta made with refined flour. White rice. Meats and  other proteins Fatty cuts of meat. Fried chicken or fried fish. Dairy Milk. Yogurt. Cream cheese. Sour cream. Fats and oils Butters. Beverages Soft drinks. Other foods Cakes and pastries. The items listed above may not be a complete list of foods and beverages to avoid. Contact a dietitian for more information. Summary  Fiber is a type of carbohydrate. It is found in fruits, vegetables, whole grains, and beans.  There are many health benefits of eating a high-fiber diet, such as preventing constipation, lowering blood cholesterol, helping with weight loss, and reducing your risk of heart disease, diabetes, and certain cancers.  Gradually increase your intake of fiber. Increasing too fast can result in cramping, bloating, and gas. Drink plenty of water while you increase your fiber.  The best sources of fiber include whole fruits and vegetables, whole grains, nuts, seeds, and beans. This information is not intended to replace advice given to you by your health care provider. Make sure you discuss any questions you have with your health care provider. Document Revised: 03/05/2017 Document Reviewed: 03/05/2017 Elsevier Patient Education  2020 [employer].

## 2019-12-12 LAB — CBC WITH DIFFERENTIAL/PLATELET
Absolute Monocytes: 585 cells/uL (ref 200–950)
Basophils Absolute: 27 cells/uL (ref 0–200)
Basophils Relative: 0.4 %
Eosinophils Absolute: 184 cells/uL (ref 15–500)
Eosinophils Relative: 2.7 %
HCT: 41.3 % (ref 38.5–50.0)
Hemoglobin: 13.7 g/dL (ref 13.2–17.1)
Lymphs Abs: 1598 cells/uL (ref 850–3900)
MCH: 31.6 pg (ref 27.0–33.0)
MCHC: 33.2 g/dL (ref 32.0–36.0)
MCV: 95.4 fL (ref 80.0–100.0)
MPV: 11.2 fL (ref 7.5–12.5)
Monocytes Relative: 8.6 %
Neutro Abs: 4406 cells/uL (ref 1500–7800)
Neutrophils Relative %: 64.8 %
Platelets: 117 10*3/uL — ABNORMAL LOW (ref 140–400)
RBC: 4.33 10*6/uL (ref 4.20–5.80)
RDW: 12.9 % (ref 11.0–15.0)
Total Lymphocyte: 23.5 %
WBC: 6.8 10*3/uL (ref 3.8–10.8)

## 2019-12-12 LAB — MAGNESIUM: Magnesium: 2.1 mg/dL (ref 1.5–2.5)

## 2019-12-12 LAB — COMPLETE METABOLIC PANEL WITH GFR
AG Ratio: 1.6 (calc) (ref 1.0–2.5)
ALT: 5 U/L — ABNORMAL LOW (ref 9–46)
AST: 13 U/L (ref 10–35)
Albumin: 4.2 g/dL (ref 3.6–5.1)
Alkaline phosphatase (APISO): 59 U/L (ref 35–144)
BUN/Creatinine Ratio: 16 (calc) (ref 6–22)
BUN: 31 mg/dL — ABNORMAL HIGH (ref 7–25)
CO2: 32 mmol/L (ref 20–32)
Calcium: 9.8 mg/dL (ref 8.6–10.3)
Chloride: 103 mmol/L (ref 98–110)
Creat: 1.88 mg/dL — ABNORMAL HIGH (ref 0.70–1.18)
GFR, Est African American: 41 mL/min/{1.73_m2} — ABNORMAL LOW (ref >=60)
GFR, Est Non African American: 35 mL/min/{1.73_m2} — ABNORMAL LOW (ref >=60)
Globulin: 2.6 g/dL (calc) (ref 1.9–3.7)
Glucose, Bld: 143 mg/dL — ABNORMAL HIGH (ref 65–99)
Potassium: 4.1 mmol/L (ref 3.5–5.3)
Sodium: 142 mmol/L (ref 135–146)
Total Bilirubin: 0.7 mg/dL (ref 0.2–1.2)
Total Protein: 6.8 g/dL (ref 6.1–8.1)

## 2019-12-12 LAB — LIPID PANEL
Cholesterol: 148 mg/dL (ref <200)
HDL: 56 mg/dL (ref >=40)
LDL Cholesterol (Calc): 78 mg/dL (calc)
Non-HDL Cholesterol (Calc): 92 mg/dL (calc) (ref <130)
Total CHOL/HDL Ratio: 2.6 (calc) (ref <5.0)
Triglycerides: 68 mg/dL (ref <150)

## 2019-12-12 LAB — TSH: TSH: 1.45 mIU/L (ref 0.40–4.50)
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
## 2019-12-25 ENCOUNTER — Ambulatory Visit: Payer: Medicare HMO | Admitting: Adult Health

## 2019-12-29 NOTE — Progress Notes (Signed)
Assessment and Plan:  Deterioration in renal function Stage 3a chronic kidney disease Has increased fluid intake, reduced lasix dose No notable concerning sx,  Follow up pending labs If trending down further without explanation consider [place] and/or nephrology referral  -     BASIC METABOLIC PANEL WITH GFR -     Magnesium -     Urinalysis, Routine w reflex microscopic  Chronic systolic heart failure (HCC) Appears euvolemic; weights down despite diuretic dose reduction Continue with current plan pending lab results Emphasized salt restriction, less than 2000mg  a day. Encouraged daily monitoring of the patient's weight, call office if 5 lb weight loss or gain in a day.  Encouraged regular exercise. If any increasing shortness of breath, swelling, or chest pressure go to ER immediately or call office.  -     furosemide (LASIX) 40 MG tablet; Take 1/2 tab (20 mg) daily for BP and fluid.  Essential hypertension Remains well controlled; continue to monitor daily at home Monitor blood pressure at home; call if consistently over 130/80 Continue DASH diet.   Reminder to go to the ER if any CP, SOB, nausea, dizziness, severe HA, changes vision/speech, left arm numbness and tingling and jaw pain.   Further disposition pending results of labs. Discussed med's effects and SE's.   Over 30 minutes of exam, counseling, chart review, and critical decision making was performed.   Future Appointments  Date Time Provider Department Center  03/23/2020  2:00 PM 13/01/2020, MD [id number] None    ------------------------------------------------------------------------------------------------------------------   HPI BP 122/70    Pulse (!) 56    Temp (!) 97.5 F (36.4 C)    Wt 138 lb (62.6 kg)    SpO2 99%    BMI 21.94 kg/m   74 y.o.male, mild dementia with guardian, hx of htn, CHF, CKD III presents for 2 week follow up after noting deteriorating renal functions. His partner Lucky Cowboy whom he lives  with brings him in today and has been helping monitor meds and medical recommendations.   He was advised to increase fluid intake, monitor sodium, reduce lasix 40 > 20 mg   His blood pressure has been controlled at home, today their BP is BP: 122/70  He does workout, walks with caregiver daily in the morning. He denies chest pain, shortness of breath, dizziness.  He has a history of Systolic CHF, EF Liborio Nixon per last ECHO 2017 Denies dyspnea on exertion, orthopnea, paroxysmal nocturnal dyspnea and edema. Positive for none. Wt Readings from Last 3 Encounters:  12/31/19 138 lb (62.6 kg)  12/11/19 139 lb (63 kg)  09/09/19 140 lb 4.8 oz (63.6 kg)   He reports drinking 4-5 bottles daily of water, tolerating well, up from 2-3 bottles per caregiver No alcohol, non-smoker, denies NSAID use Denies hematuria, dysuria, flank pain, hx of renal calculi  Lab Results  Component Value Date   GFRNONAA 35 (L) 12/11/2019   GFRNONAA 44 (L) 09/09/2019   GFRNONAA 52 (L) 06/02/2019   Lab Results  Component Value Date   CREATININE 1.88 (H) 12/11/2019   CREATININE 1.56 (H) 09/09/2019   CREATININE 1.36 (H) 06/02/2019     Past Medical History:  Diagnosis Date   CAD (coronary artery disease), native coronary artery    Coronary calcifications noted on CT scan.    Cardiomyopathy of undetermined type (HCC)    Chronic systolic heart failure (HCC)    Echo 2015 EF 30-35%    CVA (cerebral vascular accident) [facility]) June 2013   Embedded metal  fragments    Metallic Pellet In Heart   Gout    History of ischemic vertebrobasilar artery cerebellar stroke    Hyperlipemia    Hyperlipidemia    Hypertension    Hypertensive heart disease    Illiteracy    Pre-diabetes    Pulmonary embolism (HCC) 01/01/2016   Vitamin D deficiency      Allergies  Allergen Reactions   Lisinopril Other (See Comments)    angioedema    Current Outpatient Medications on File Prior to Visit  Medication Sig    amLODipine (NORVASC) 10 MG tablet Take 1 tablet Daily for BP   carvedilol (COREG) 25 MG tablet Take 1/2 tablet 2 x /day with meal for BP   Cholecalciferol (VITAMIN D3) 50 MCG (2000 UT) capsule Take 2 capsules Daily for Vitamin  D Deficiency   donepezil (ARICEPT) 5 MG tablet Take 1 tablet Daily for Memory   furosemide (LASIX) 40 MG tablet TAKE 1 TABLET EVERY DAY FOR BLOOD PRESSURE AND FLUID   pravastatin (PRAVACHOL) 20 MG tablet Take 1 tablet at Bedtime for Cholesterol   rivaroxaban (XARELTO) 20 MG TABS tablet Take 1 tablet Daily to Prevent Blood Clots   sertraline (ZOLOFT) 100 MG tablet Take 1 tablet Daily for Mood   tamsulosin (FLOMAX) 0.4 MG CAPS capsule Take 1 capsule at Bedtime for Prostate   gabapentin (NEURONTIN) 100 MG capsule Take 1 capsule 3 x /day for Chronic Pain (Patient not taking: Reported on 12/31/2019)   No current facility-administered medications on file prior to visit.    ROS: all negative except above.   Physical Exam:  BP 122/70    Pulse (!) 40    Temp (!) 97.5 F (36.4 C)    Wt 138 lb (62.6 kg)    SpO2 99%    BMI 21.94 kg/m    General Appearance: Well nourished, in no apparent distress. Eyes: PERRLA, EOMs, conjunctiva no swelling or erythema Sinuses: No Frontal/maxillary tenderness ENT/Mouth: Ext aud canals clear, TMs without erythema, bulging. No erythema, swelling, or exudate on post pharynx.  Tonsils not swollen or erythematous. Hearing normal.  Neck: Supple, thyroid normal.  Respiratory: Respiratory effort normal, distant heart sounds, BS equal bilaterally without rales, rhonchi, wheezing or stridor.  Cardio: RRR with 3/6 systolic murmur RSB. Brisk peripheral pulses without edema.  Abdomen: Soft, + BS,  Non tender, no guarding, rebound, hernias, masses. Lymphatics: Non tender without lymphadenopathy.  Musculoskeletal: Full ROM, 4/5 strength, slow gait with cane, orthotic platform shoe to right Skin: Warm, dry without rashes, lesions, ecchymosis.   Neuro: Cranial nerves intact. Normal muscle tone, no cerebellar symptoms. Psych: Awake and oriented X 3, normal affect, Insight and Judgment approproate   Dan Maker, NP 3:37 PM [facility]

## 2019-12-31 ENCOUNTER — Other Ambulatory Visit: Payer: Self-pay

## 2019-12-31 ENCOUNTER — Ambulatory Visit (INDEPENDENT_AMBULATORY_CARE_PROVIDER_SITE_OTHER): Payer: Medicare HMO | Admitting: Adult Health

## 2019-12-31 ENCOUNTER — Encounter: Payer: Self-pay | Admitting: Adult Health

## 2019-12-31 VITALS — BP 122/70 | HR 56 | Temp 97.5°F | Wt 138.0 lb

## 2019-12-31 DIAGNOSIS — I10 Essential (primary) hypertension: Secondary | ICD-10-CM | POA: Diagnosis not present

## 2019-12-31 DIAGNOSIS — I5022 Chronic systolic (congestive) heart failure: Secondary | ICD-10-CM | POA: Diagnosis not present

## 2019-12-31 DIAGNOSIS — N1831 Chronic kidney disease, stage 3a: Secondary | ICD-10-CM

## 2019-12-31 DIAGNOSIS — N289 Disorder of kidney and ureter, unspecified: Secondary | ICD-10-CM | POA: Diagnosis not present

## 2019-12-31 MED ORDER — FUROSEMIDE 40 MG PO TABS: ORAL_TABLET | ORAL | 1 refills | Status: DC

## 2019-12-31 NOTE — Patient Instructions (Signed)
Chronic Kidney Disease, Adult Chronic kidney disease (CKD) happens when the kidneys are damaged over a long period of time. The kidneys are two organs that help with:  Getting rid of waste and extra fluid from the blood.  Making hormones that maintain the amount of fluid in your tissues and blood vessels.  Making sure that the body has the right amount of fluids and chemicals. Most of the time, CKD does not go away, but it can usually be controlled. Steps must be taken to slow down the kidney damage or to stop it from getting worse. If this is not done, the kidneys may stop working. Follow these instructions at home: Medicines  Take over-the-counter and prescription medicines only as told by your doctor. You may need to change the amount of medicines you take.  Do not take any new medicines unless your doctor says it is okay. Many medicines can make your kidney damage worse.  Do not take any vitamin and supplements unless your doctor says it is okay. Many vitamins and supplements can make your kidney damage worse. General instructions  Follow a diet as told by your doctor. You may need to stay away from: ? Alcohol. ? Salty foods. ? Foods that are high in:  Potassium.  Calcium.  Protein.  Do not use any products that contain nicotine or tobacco, such as cigarettes and e-cigarettes. If you need help quitting, ask your doctor.  Keep track of your blood pressure at home. Tell your doctor about any changes.  If you have diabetes, keep track of your blood sugar as told by your doctor.  Try to stay at a healthy weight. If you need help, ask your doctor.  Exercise at least 30 minutes a day, 5 days a week.  Stay up-to-date with your shots (immunizations) as told by your doctor.  Keep all follow-up visits as told by your doctor. This is important. Contact a doctor if:  Your symptoms get worse.  You have new symptoms. Get help right away if:  You have symptoms of end-stage  kidney disease. These may include: ? Headaches. ? Numbness in your hands or feet. ? Easy bruising. ? Having hiccups often. ? Chest pain. ? Shortness of breath. ? Stopping of menstrual periods in women.  You have a fever.  You have very little pee (urine).  You have pain or bleeding when you pee. Summary  Chronic kidney disease (CKD) happens when the kidneys are damaged over a long period of time.  Most of the time, this condition does not go away, but it can usually be controlled. Steps must be taken to slow down the kidney damage or to stop it from getting worse.  Treatment may include a combination of medicines and lifestyle changes. This information is not intended to replace advice given to you by your health care provider. Make sure you discuss any questions you have with your health care provider. Document Revised: 04/13/2017 Document Reviewed: 06/05/2016 Elsevier Patient Education  2020 Elsevier Inc.  

## 2020-01-01 ENCOUNTER — Other Ambulatory Visit: Payer: Self-pay | Admitting: Adult Health

## 2020-01-01 LAB — URINALYSIS, ROUTINE W REFLEX MICROSCOPIC
Bilirubin Urine: NEGATIVE
Glucose, UA: NEGATIVE
Hgb urine dipstick: NEGATIVE
Ketones, ur: NEGATIVE
Leukocytes,Ua: NEGATIVE
Nitrite: NEGATIVE
Protein, ur: NEGATIVE
Specific Gravity, Urine: 1.01 (ref 1.001–1.03)
pH: 5.5 (ref 5.0–8.0)

## 2020-01-01 LAB — MAGNESIUM: Magnesium: 2.1 mg/dL (ref 1.5–2.5)

## 2020-01-01 LAB — BASIC METABOLIC PANEL WITH GFR
BUN/Creatinine Ratio: 16 (calc) (ref 6–22)
BUN: 25 mg/dL (ref 7–25)
CO2: 31 mmol/L (ref 20–32)
Calcium: 9.8 mg/dL (ref 8.6–10.3)
Chloride: 100 mmol/L (ref 98–110)
Creat: 1.56 mg/dL — ABNORMAL HIGH (ref 0.70–1.18)
GFR, Est African American: 51 mL/min/{1.73_m2} — ABNORMAL LOW (ref >=60)
GFR, Est Non African American: 44 mL/min/{1.73_m2} — ABNORMAL LOW (ref >=60)
Glucose, Bld: 115 mg/dL — ABNORMAL HIGH (ref 65–99)
Potassium: 4.2 mmol/L (ref 3.5–5.3)
Sodium: 137 mmol/L (ref 135–146)

## 2020-01-01 MED ORDER — FUROSEMIDE 20 MG PO TABS: ORAL_TABLET | ORAL | 1 refills | Status: AC

## 2020-03-09 ENCOUNTER — Other Ambulatory Visit: Payer: Self-pay | Admitting: Internal Medicine

## 2020-03-09 DIAGNOSIS — E782 Mixed hyperlipidemia: Secondary | ICD-10-CM

## 2020-03-18 ENCOUNTER — Other Ambulatory Visit: Payer: Self-pay | Admitting: Internal Medicine

## 2020-03-18 DIAGNOSIS — N401 Enlarged prostate with lower urinary tract symptoms: Secondary | ICD-10-CM

## 2020-03-22 ENCOUNTER — Encounter: Payer: Self-pay | Admitting: Internal Medicine

## 2020-03-22 DIAGNOSIS — I7121 Aneurysm of the ascending aorta, without rupture: Secondary | ICD-10-CM | POA: Insufficient documentation

## 2020-03-22 DIAGNOSIS — I712 Thoracic aortic aneurysm, without rupture: Secondary | ICD-10-CM | POA: Insufficient documentation
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
## 2020-03-22 NOTE — Patient Instructions (Signed)

## 2020-03-23 ENCOUNTER — Other Ambulatory Visit: Payer: Self-pay

## 2020-03-23 ENCOUNTER — Ambulatory Visit (INDEPENDENT_AMBULATORY_CARE_PROVIDER_SITE_OTHER): Payer: Medicare HMO | Admitting: Internal Medicine

## 2020-03-23 VITALS — BP 106/60 | HR 56 | Temp 97.7°F | Resp 16 | Ht 66.0 in | Wt 131.4 lb

## 2020-03-23 DIAGNOSIS — Z0000 Encounter for general adult medical examination without abnormal findings: Secondary | ICD-10-CM | POA: Diagnosis not present

## 2020-03-23 DIAGNOSIS — E559 Vitamin D deficiency, unspecified: Secondary | ICD-10-CM

## 2020-03-23 DIAGNOSIS — N138 Other obstructive and reflux uropathy: Secondary | ICD-10-CM

## 2020-03-23 DIAGNOSIS — I712 Thoracic aortic aneurysm, without rupture: Secondary | ICD-10-CM

## 2020-03-23 DIAGNOSIS — Z125 Encounter for screening for malignant neoplasm of prostate: Secondary | ICD-10-CM | POA: Diagnosis not present

## 2020-03-23 DIAGNOSIS — Z1211 Encounter for screening for malignant neoplasm of colon: Secondary | ICD-10-CM

## 2020-03-23 DIAGNOSIS — N401 Enlarged prostate with lower urinary tract symptoms: Secondary | ICD-10-CM

## 2020-03-23 DIAGNOSIS — Z79899 Other long term (current) drug therapy: Secondary | ICD-10-CM | POA: Diagnosis not present

## 2020-03-23 DIAGNOSIS — R7303 Prediabetes: Secondary | ICD-10-CM

## 2020-03-23 DIAGNOSIS — M1000 Idiopathic gout, unspecified site: Secondary | ICD-10-CM

## 2020-03-23 DIAGNOSIS — F0390 Unspecified dementia without behavioral disturbance: Secondary | ICD-10-CM

## 2020-03-23 DIAGNOSIS — Z23 Encounter for immunization: Secondary | ICD-10-CM | POA: Diagnosis not present

## 2020-03-23 DIAGNOSIS — Z0001 Encounter for general adult medical examination with abnormal findings: Secondary | ICD-10-CM

## 2020-03-23 DIAGNOSIS — R7309 Other abnormal glucose: Secondary | ICD-10-CM

## 2020-03-23 DIAGNOSIS — E782 Mixed hyperlipidemia: Secondary | ICD-10-CM | POA: Diagnosis not present

## 2020-03-23 DIAGNOSIS — I7121 Aneurysm of the ascending aorta, without rupture: Secondary | ICD-10-CM

## 2020-03-23 DIAGNOSIS — I10 Essential (primary) hypertension: Secondary | ICD-10-CM

## 2020-03-23 DIAGNOSIS — Z136 Encounter for screening for cardiovascular disorders: Secondary | ICD-10-CM

## 2020-03-23 DIAGNOSIS — F03A0 Unspecified dementia, mild, without behavioral disturbance, psychotic disturbance, mood disturbance, and anxiety: Secondary | ICD-10-CM

## 2020-03-23 DIAGNOSIS — Z8249 Family history of ischemic heart disease and other diseases of the circulatory system: Secondary | ICD-10-CM | POA: Diagnosis not present

## 2020-03-23 DIAGNOSIS — D6859 Other primary thrombophilia: Secondary | ICD-10-CM

## 2020-03-24 LAB — CBC WITH DIFFERENTIAL/PLATELET
Absolute Monocytes: 893 cells/uL (ref 200–950)
Basophils Absolute: 34 cells/uL (ref 0–200)
Basophils Relative: 0.4 %
Eosinophils Absolute: 153 cells/uL (ref 15–500)
Eosinophils Relative: 1.8 %
HCT: 40.3 % (ref 38.5–50.0)
Hemoglobin: 13.8 g/dL (ref 13.2–17.1)
Lymphs Abs: 1964 cells/uL (ref 850–3900)
MCH: 32.1 pg (ref 27.0–33.0)
MCHC: 34.2 g/dL (ref 32.0–36.0)
MCV: 93.7 fL (ref 80.0–100.0)
MPV: 10.8 fL (ref 7.5–12.5)
Monocytes Relative: 10.5 %
Neutro Abs: 5457 cells/uL (ref 1500–7800)
Neutrophils Relative %: 64.2 %
Platelets: 145 10*3/uL (ref 140–400)
RBC: 4.3 10*6/uL (ref 4.20–5.80)
RDW: 12.9 % (ref 11.0–15.0)
Total Lymphocyte: 23.1 %
WBC: 8.5 10*3/uL (ref 3.8–10.8)

## 2020-03-24 LAB — MICROALBUMIN / CREATININE URINE RATIO
Creatinine, Urine: 74 mg/dL (ref 20–320)
Microalb Creat Ratio: 7 mcg/mg creat (ref <30)
Microalb, Ur: 0.5 mg/dL

## 2020-03-24 LAB — COMPLETE METABOLIC PANEL WITH GFR
AG Ratio: 1.7 (calc) (ref 1.0–2.5)
ALT: 7 U/L — ABNORMAL LOW (ref 9–46)
AST: 11 U/L (ref 10–35)
Albumin: 4.1 g/dL (ref 3.6–5.1)
Alkaline phosphatase (APISO): 63 U/L (ref 35–144)
BUN/Creatinine Ratio: 19 (calc) (ref 6–22)
BUN: 32 mg/dL — ABNORMAL HIGH (ref 7–25)
CO2: 26 mmol/L (ref 20–32)
Calcium: 9.7 mg/dL (ref 8.6–10.3)
Chloride: 103 mmol/L (ref 98–110)
Creat: 1.72 mg/dL — ABNORMAL HIGH (ref 0.70–1.18)
GFR, Est African American: 45 mL/min/{1.73_m2} — ABNORMAL LOW (ref >=60)
GFR, Est Non African American: 39 mL/min/{1.73_m2} — ABNORMAL LOW (ref >=60)
Globulin: 2.4 g/dL (calc) (ref 1.9–3.7)
Glucose, Bld: 110 mg/dL — ABNORMAL HIGH (ref 65–99)
Potassium: 4.3 mmol/L (ref 3.5–5.3)
Sodium: 140 mmol/L (ref 135–146)
Total Bilirubin: 0.9 mg/dL (ref 0.2–1.2)
Total Protein: 6.5 g/dL (ref 6.1–8.1)

## 2020-03-24 LAB — TSH: TSH: 1.76 mIU/L (ref 0.40–4.50)

## 2020-03-24 LAB — LIPID PANEL
Cholesterol: 121 mg/dL (ref <200)
HDL: 55 mg/dL (ref >=40)
LDL Cholesterol (Calc): 50 mg/dL (calc)
Non-HDL Cholesterol (Calc): 66 mg/dL (calc) (ref <130)
Total CHOL/HDL Ratio: 2.2 (calc) (ref <5.0)
Triglycerides: 78 mg/dL (ref <150)

## 2020-03-24 LAB — URINALYSIS, ROUTINE W REFLEX MICROSCOPIC
Bilirubin Urine: NEGATIVE
Glucose, UA: NEGATIVE
Hgb urine dipstick: NEGATIVE
Ketones, ur: NEGATIVE
Leukocytes,Ua: NEGATIVE
Nitrite: NEGATIVE
Protein, ur: NEGATIVE
Specific Gravity, Urine: 1.012 (ref 1.001–1.03)
pH: <=5 (ref 5.0–8.0)

## 2020-03-24 LAB — PSA: PSA: 0.82 ng/mL (ref <=4.0)

## 2020-03-24 LAB — HEMOGLOBIN A1C
Hgb A1c MFr Bld: 5.6 % of total Hgb (ref <5.7)
Mean Plasma Glucose: 114 (calc)
eAG (mmol/L): 6.3 (calc)

## 2020-03-24 LAB — URIC ACID: Uric Acid, Serum: 6.6 mg/dL (ref 4.0–8.0)

## 2020-03-24 LAB — INSULIN, RANDOM: Insulin: 8.5 u[IU]/mL

## 2020-03-24 LAB — MAGNESIUM: Magnesium: 2.1 mg/dL (ref 1.5–2.5)

## 2020-03-24 LAB — VITAMIN D 25 HYDROXY (VIT D DEFICIENCY, FRACTURES): Vit D, 25-Hydroxy: 93 ng/mL (ref 30–100)

## 2020-03-24 NOTE — Progress Notes (Signed)
========================================================== °========================================================== ° °-    PSA - Low - Great  ==========================================================  -  Kidney functions - Stable in Stage 3b ==========================================================  -  Vit D = 93 - Excellent  ==========================================================  -  All Else - CBC - Electrolytes - Liver - Magnesium & Thyroid    - all  Normal / OK ====================================================

## 2020-04-06 ENCOUNTER — Ambulatory Visit: Payer: Medicare HMO | Attending: Internal Medicine

## 2020-04-06 DIAGNOSIS — Z23 Encounter for immunization: Secondary | ICD-10-CM

## 2020-04-06 NOTE — Progress Notes (Signed)
° °  Covid-19 Vaccination Clinic  Name:  Dennis [name]    MRN: [id number] DOB: March 30, 1946  04/06/2020  [name] was observed post Covid-19 immunization for 15 minutes without incident. He was provided with Vaccine Information Sheet and instruction to access the V-Safe system.   [name] was instructed to call 911 with any severe reactions post vaccine:  Difficulty breathing   Swelling of face and throat   A fast heartbeat   A bad rash all over body   Dizziness and weakness   Immunizations Administered    Name Date Dose VIS Date Route   Pfizer COVID-19 Vaccine 04/06/2020  1:08 PM 0.3 mL 03/03/2020 Intramuscular   Manufacturer: [employer], [employer]   Lot: [id number]   NDC: [address]-2

## 2020-05-02 ENCOUNTER — Encounter (HOSPITAL_COMMUNITY): Payer: Self-pay | Admitting: Emergency Medicine

## 2020-05-02 ENCOUNTER — Other Ambulatory Visit: Payer: Self-pay

## 2020-05-02 ENCOUNTER — Emergency Department (HOSPITAL_COMMUNITY): Payer: Medicare HMO

## 2020-05-02 ENCOUNTER — Inpatient Hospital Stay (HOSPITAL_COMMUNITY)
Admission: EM | Admit: 2020-05-02 | Discharge: 2020-05-06 | DRG: 175 | Disposition: A | Discharge status: Skilled Nursing Facility | Payer: Medicare HMO | Attending: Family Medicine | Admitting: Family Medicine

## 2020-05-02 DIAGNOSIS — M109 Gout, unspecified: Secondary | ICD-10-CM | POA: Diagnosis not present

## 2020-05-02 DIAGNOSIS — F411 Generalized anxiety disorder: Secondary | ICD-10-CM | POA: Diagnosis present

## 2020-05-02 DIAGNOSIS — N400 Enlarged prostate without lower urinary tract symptoms: Secondary | ICD-10-CM | POA: Diagnosis present

## 2020-05-02 DIAGNOSIS — Z888 Allergy status to other drugs, medicaments and biological substances status: Secondary | ICD-10-CM | POA: Diagnosis not present

## 2020-05-02 DIAGNOSIS — Z86718 Personal history of other venous thrombosis and embolism: Secondary | ICD-10-CM | POA: Diagnosis not present

## 2020-05-02 DIAGNOSIS — M6282 Rhabdomyolysis: Secondary | ICD-10-CM | POA: Diagnosis present

## 2020-05-02 DIAGNOSIS — I340 Nonrheumatic mitral (valve) insufficiency: Secondary | ICD-10-CM | POA: Diagnosis not present

## 2020-05-02 DIAGNOSIS — J9601 Acute respiratory failure with hypoxia: Secondary | ICD-10-CM | POA: Diagnosis not present

## 2020-05-02 DIAGNOSIS — I2699 Other pulmonary embolism without acute cor pulmonale: Principal | ICD-10-CM | POA: Diagnosis present

## 2020-05-02 DIAGNOSIS — U071 COVID-19: Secondary | ICD-10-CM | POA: Diagnosis not present

## 2020-05-02 DIAGNOSIS — M2550 Pain in unspecified joint: Secondary | ICD-10-CM | POA: Diagnosis not present

## 2020-05-02 DIAGNOSIS — Z681 Body mass index (BMI) 19 or less, adult: Secondary | ICD-10-CM | POA: Diagnosis not present

## 2020-05-02 DIAGNOSIS — Z7902 Long term (current) use of antithrombotics/antiplatelets: Secondary | ICD-10-CM

## 2020-05-02 DIAGNOSIS — R4182 Altered mental status, unspecified: Secondary | ICD-10-CM | POA: Diagnosis not present

## 2020-05-02 DIAGNOSIS — R609 Edema, unspecified: Secondary | ICD-10-CM | POA: Diagnosis not present

## 2020-05-02 DIAGNOSIS — F0390 Unspecified dementia without behavioral disturbance: Secondary | ICD-10-CM | POA: Diagnosis present

## 2020-05-02 DIAGNOSIS — Z86711 Personal history of pulmonary embolism: Secondary | ICD-10-CM

## 2020-05-02 DIAGNOSIS — S70351A Superficial foreign body, right thigh, initial encounter: Secondary | ICD-10-CM | POA: Diagnosis not present

## 2020-05-02 DIAGNOSIS — R0602 Shortness of breath: Secondary | ICD-10-CM | POA: Diagnosis not present

## 2020-05-02 DIAGNOSIS — I351 Nonrheumatic aortic (valve) insufficiency: Secondary | ICD-10-CM | POA: Diagnosis not present

## 2020-05-02 DIAGNOSIS — I10 Essential (primary) hypertension: Secondary | ICD-10-CM | POA: Diagnosis not present

## 2020-05-02 DIAGNOSIS — Z9181 History of falling: Secondary | ICD-10-CM

## 2020-05-02 DIAGNOSIS — Z96641 Presence of right artificial hip joint: Secondary | ICD-10-CM | POA: Diagnosis present

## 2020-05-02 DIAGNOSIS — Z8249 Family history of ischemic heart disease and other diseases of the circulatory system: Secondary | ICD-10-CM

## 2020-05-02 DIAGNOSIS — Z79899 Other long term (current) drug therapy: Secondary | ICD-10-CM

## 2020-05-02 DIAGNOSIS — E782 Mixed hyperlipidemia: Secondary | ICD-10-CM | POA: Diagnosis present

## 2020-05-02 DIAGNOSIS — F32A Depression, unspecified: Secondary | ICD-10-CM | POA: Diagnosis present

## 2020-05-02 DIAGNOSIS — I110 Hypertensive heart disease with heart failure: Secondary | ICD-10-CM

## 2020-05-02 DIAGNOSIS — I2510 Atherosclerotic heart disease of native coronary artery without angina pectoris: Secondary | ICD-10-CM | POA: Diagnosis present

## 2020-05-02 DIAGNOSIS — E440 Moderate protein-calorie malnutrition: Secondary | ICD-10-CM | POA: Diagnosis present

## 2020-05-02 DIAGNOSIS — R7309 Other abnormal glucose: Secondary | ICD-10-CM | POA: Diagnosis not present

## 2020-05-02 DIAGNOSIS — M25551 Pain in right hip: Secondary | ICD-10-CM | POA: Diagnosis not present

## 2020-05-02 DIAGNOSIS — Z8673 Personal history of transient ischemic attack (TIA), and cerebral infarction without residual deficits: Secondary | ICD-10-CM | POA: Diagnosis not present

## 2020-05-02 DIAGNOSIS — Z7401 Bed confinement status: Secondary | ICD-10-CM | POA: Diagnosis not present

## 2020-05-02 DIAGNOSIS — I130 Hypertensive heart and chronic kidney disease with heart failure and stage 1 through stage 4 chronic kidney disease, or unspecified chronic kidney disease: Secondary | ICD-10-CM | POA: Diagnosis present

## 2020-05-02 DIAGNOSIS — Z550 Illiteracy and low-level literacy: Secondary | ICD-10-CM

## 2020-05-02 DIAGNOSIS — J9811 Atelectasis: Secondary | ICD-10-CM | POA: Diagnosis present

## 2020-05-02 DIAGNOSIS — E7849 Other hyperlipidemia: Secondary | ICD-10-CM | POA: Diagnosis not present

## 2020-05-02 DIAGNOSIS — Z8616 Personal history of COVID-19: Secondary | ICD-10-CM | POA: Diagnosis not present

## 2020-05-02 DIAGNOSIS — Z833 Family history of diabetes mellitus: Secondary | ICD-10-CM

## 2020-05-02 DIAGNOSIS — M1611 Unilateral primary osteoarthritis, right hip: Secondary | ICD-10-CM | POA: Diagnosis not present

## 2020-05-02 DIAGNOSIS — Z20822 Contact with and (suspected) exposure to covid-19: Secondary | ICD-10-CM | POA: Diagnosis not present

## 2020-05-02 DIAGNOSIS — N1831 Chronic kidney disease, stage 3a: Secondary | ICD-10-CM | POA: Diagnosis not present

## 2020-05-02 DIAGNOSIS — I429 Cardiomyopathy, unspecified: Secondary | ICD-10-CM | POA: Diagnosis not present

## 2020-05-02 DIAGNOSIS — R7303 Prediabetes: Secondary | ICD-10-CM | POA: Diagnosis present

## 2020-05-02 DIAGNOSIS — N183 Chronic kidney disease, stage 3 unspecified: Secondary | ICD-10-CM | POA: Diagnosis present

## 2020-05-02 DIAGNOSIS — I119 Hypertensive heart disease without heart failure: Secondary | ICD-10-CM | POA: Diagnosis present

## 2020-05-02 DIAGNOSIS — R531 Weakness: Secondary | ICD-10-CM | POA: Diagnosis not present

## 2020-05-02 DIAGNOSIS — M79662 Pain in left lower leg: Secondary | ICD-10-CM | POA: Diagnosis not present

## 2020-05-02 DIAGNOSIS — I5022 Chronic systolic (congestive) heart failure: Secondary | ICD-10-CM | POA: Diagnosis present

## 2020-05-02 DIAGNOSIS — E559 Vitamin D deficiency, unspecified: Secondary | ICD-10-CM | POA: Diagnosis present

## 2020-05-02 DIAGNOSIS — Z823 Family history of stroke: Secondary | ICD-10-CM

## 2020-05-02 DIAGNOSIS — R2241 Localized swelling, mass and lump, right lower limb: Secondary | ICD-10-CM | POA: Diagnosis not present

## 2020-05-02 DIAGNOSIS — R911 Solitary pulmonary nodule: Secondary | ICD-10-CM | POA: Diagnosis not present

## 2020-05-02 DIAGNOSIS — R079 Chest pain, unspecified: Secondary | ICD-10-CM | POA: Diagnosis not present

## 2020-05-02 DIAGNOSIS — I517 Cardiomegaly: Secondary | ICD-10-CM | POA: Diagnosis not present

## 2020-05-02 LAB — CBC
HCT: 30.5 % — ABNORMAL LOW (ref 39.0–52.0)
Hemoglobin: 10 g/dL — ABNORMAL LOW (ref 13.0–17.0)
MCH: 31.8 pg (ref 26.0–34.0)
MCHC: 32.8 g/dL (ref 30.0–36.0)
MCV: 97.1 fL (ref 80.0–100.0)
Platelets: 141 10*3/uL — ABNORMAL LOW (ref 150–400)
RBC: 3.14 MIL/uL — ABNORMAL LOW (ref 4.22–5.81)
RDW: 13.1 % (ref 11.5–15.5)
WBC: 8 10*3/uL (ref 4.0–10.5)
nRBC: 0 % (ref 0.0–0.2)

## 2020-05-02 LAB — BASIC METABOLIC PANEL
Anion gap: 11 (ref 5–15)
BUN: 31 mg/dL — ABNORMAL HIGH (ref 8–23)
CO2: 25 mmol/L (ref 22–32)
Calcium: 9.4 mg/dL (ref 8.9–10.3)
Chloride: 107 mmol/L (ref 98–111)
Creatinine, Ser: 1.41 mg/dL — ABNORMAL HIGH (ref 0.61–1.24)
GFR, Estimated: 53 mL/min — ABNORMAL LOW (ref >60)
Glucose, Bld: 159 mg/dL — ABNORMAL HIGH (ref 70–99)
Potassium: 4.3 mmol/L (ref 3.5–5.1)
Sodium: 143 mmol/L (ref 135–145)

## 2020-05-02 LAB — URINALYSIS, ROUTINE W REFLEX MICROSCOPIC
Bilirubin Urine: NEGATIVE
Glucose, UA: NEGATIVE mg/dL
Hgb urine dipstick: NEGATIVE
Ketones, ur: NEGATIVE mg/dL
Leukocytes,Ua: NEGATIVE
Nitrite: NEGATIVE
Protein, ur: NEGATIVE mg/dL
Specific Gravity, Urine: 1.015 (ref 1.005–1.030)
pH: 5 (ref 5.0–8.0)

## 2020-05-02 LAB — RESP PANEL BY RT-PCR (FLU A&B, COVID) ARPGX2
Influenza A by PCR: NEGATIVE
Influenza B by PCR: NEGATIVE
SARS Coronavirus 2 by RT PCR: NEGATIVE

## 2020-05-02 LAB — HEPATIC FUNCTION PANEL
ALT: 67 U/L — ABNORMAL HIGH (ref 0–44)
AST: 97 U/L — ABNORMAL HIGH (ref 15–41)
Albumin: 3.2 g/dL — ABNORMAL LOW (ref 3.5–5.0)
Alkaline Phosphatase: 57 U/L (ref 38–126)
Bilirubin, Direct: 0.2 mg/dL (ref 0.0–0.2)
Indirect Bilirubin: 1.1 mg/dL — ABNORMAL HIGH (ref 0.3–0.9)
Total Bilirubin: 1.3 mg/dL — ABNORMAL HIGH (ref 0.3–1.2)
Total Protein: 6.5 g/dL (ref 6.5–8.1)

## 2020-05-02 LAB — CK: Total CK: 2388 U/L — ABNORMAL HIGH (ref 49–397)

## 2020-05-02 LAB — CBG MONITORING, ED: Glucose-Capillary: 124 mg/dL — ABNORMAL HIGH (ref 70–99)

## 2020-05-02 MED ORDER — FENTANYL CITRATE (PF) 100 MCG/2ML IJ SOLN
50.0000 ug | Freq: Once | INTRAMUSCULAR | Status: AC
Start: 2020-05-02 — End: 2020-05-02
  Administered 2020-05-02: 16:00:00 50 ug via INTRAVENOUS
  Filled 2020-05-02: qty 2

## 2020-05-02 MED ORDER — DONEPEZIL HCL 5 MG PO TABS
5.0000 mg | ORAL_TABLET | Freq: Every day | ORAL | Status: DC
  Administered 2020-05-03 – 2020-05-06 (×4): 5 mg via ORAL
  Filled 2020-05-02 (×4): qty 1

## 2020-05-02 MED ORDER — ACETAMINOPHEN 650 MG RE SUPP: 650.0000 mg | Freq: Four times a day (QID) | RECTAL | Status: DC | PRN

## 2020-05-02 MED ORDER — HEPARIN BOLUS VIA INFUSION
2000.0000 [IU] | Freq: Once | INTRAVENOUS | Status: AC
  Administered 2020-05-02: 20:00:00 2000 [IU] via INTRAVENOUS
  Filled 2020-05-02: qty 2000

## 2020-05-02 MED ORDER — VITAMIN D 25 MCG (1000 UNIT) PO TABS
2000.0000 [IU] | ORAL_TABLET | Freq: Two times a day (BID) | ORAL | Status: DC
  Administered 2020-05-03 – 2020-05-06 (×7): 2000 [IU] via ORAL
  Filled 2020-05-02 (×7): qty 2

## 2020-05-02 MED ORDER — HEPARIN (PORCINE) 25000 UT/250ML-% IV SOLN
950.0000 [IU]/h | INTRAVENOUS | Status: DC
  Administered 2020-05-02: 20:00:00 1050 [IU]/h via INTRAVENOUS
  Filled 2020-05-02: qty 250

## 2020-05-02 MED ORDER — TAMSULOSIN HCL 0.4 MG PO CAPS
0.4000 mg | ORAL_CAPSULE | Freq: Every day | ORAL | Status: DC
  Administered 2020-05-02 – 2020-05-05 (×4): 0.4 mg via ORAL
  Filled 2020-05-02 (×4): qty 1

## 2020-05-02 MED ORDER — SERTRALINE HCL 50 MG PO TABS
50.0000 mg | ORAL_TABLET | Freq: Every day | ORAL | Status: DC
  Administered 2020-05-03 – 2020-05-06 (×4): 50 mg via ORAL
  Filled 2020-05-02 (×4): qty 1

## 2020-05-02 MED ORDER — SODIUM CHLORIDE 0.9 % IV BOLUS
1000.0000 mL | Freq: Once | INTRAVENOUS | Status: AC
  Administered 2020-05-02: 17:00:00 1000 mL via INTRAVENOUS

## 2020-05-02 MED ORDER — IOHEXOL 350 MG/ML SOLN
70.0000 mL | Freq: Once | INTRAVENOUS | Status: AC | PRN
  Administered 2020-05-02: 70 mL via INTRAVENOUS

## 2020-05-02 MED ORDER — ONDANSETRON HCL 4 MG/2ML IJ SOLN: 4.0000 mg | Freq: Four times a day (QID) | INTRAMUSCULAR | Status: DC | PRN

## 2020-05-02 MED ORDER — ACETAMINOPHEN 325 MG PO TABS
650.0000 mg | ORAL_TABLET | Freq: Four times a day (QID) | ORAL | Status: DC | PRN
  Administered 2020-05-03: 650 mg via ORAL
  Filled 2020-05-02: qty 2

## 2020-05-02 MED ORDER — PRAVASTATIN SODIUM 10 MG PO TABS
20.0000 mg | ORAL_TABLET | Freq: Every day | ORAL | Status: DC
  Administered 2020-05-02 – 2020-05-06 (×5): 20 mg via ORAL
  Filled 2020-05-02 (×5): qty 2

## 2020-05-02 MED ORDER — ONDANSETRON HCL 4 MG PO TABS: 4.0000 mg | ORAL_TABLET | Freq: Four times a day (QID) | ORAL | Status: DC | PRN

## 2020-05-02 NOTE — ED Notes (Signed)
Admitting at bedside 
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
## 2020-05-02 NOTE — ED Notes (Signed)
Pt is aware that urine sample is needed. Urinal at bedside.

## 2020-05-02 NOTE — ED Provider Notes (Signed)
74 yo male presenting with fall 2 weeks ago.  Here feeling fatigued, hypoxic with SPO2 89%. Hx of PE's on Xarelto.   Right thigh swelling and pain.  Pending xrays, CT PE study.  CT PE with new PE's noted.  No right heart strain.  CT pelvis with no acute traumatic findings, no fx or hematoma noted.  We'll start IV heparin and admit for PE with failed outpt therapy on A/C.  Hospitalist paged for admission.  .Critical Care Performed by: Terald Sleeper, MD Authorized by: Terald Sleeper, MD   Critical care provider statement:    Critical care time (minutes):  35   Critical care was necessary to treat or prevent imminent or life-threatening deterioration of the following conditions:  Circulatory failure   Critical care was time spent personally by me on the following activities:  Discussions with consultants, evaluation of patient's response to treatment, examination of patient, ordering and performing treatments and interventions, ordering and review of laboratory studies, ordering and review of radiographic studies, pulse oximetry, re-evaluation of patient's condition, obtaining history from patient or surrogate and review of old charts Comments:     Heparin for PE      Terald Sleeper, MD 05/02/20 1718

## 2020-05-02 NOTE — ED Notes (Signed)
Caregiver updated °

## 2020-05-02 NOTE — ED Triage Notes (Addendum)
Pt states his R leg gave out yesterday and caused him to fall.  Reports R leg pain.  Also reports generalized weakness that started today.  Denies SOB. Sats low on arrival.  Placed on 2 liters [place] on arrival and sats still in the mid 80s.  Warm blanket placed on pt's hands and O2 increased to 4 liters.

## 2020-05-03 ENCOUNTER — Inpatient Hospital Stay (HOSPITAL_COMMUNITY): Payer: Medicare HMO

## 2020-05-03 DIAGNOSIS — I351 Nonrheumatic aortic (valve) insufficiency: Secondary | ICD-10-CM

## 2020-05-03 DIAGNOSIS — R609 Edema, unspecified: Secondary | ICD-10-CM

## 2020-05-03 DIAGNOSIS — N1831 Chronic kidney disease, stage 3a: Secondary | ICD-10-CM

## 2020-05-03 DIAGNOSIS — I340 Nonrheumatic mitral (valve) insufficiency: Secondary | ICD-10-CM

## 2020-05-03 DIAGNOSIS — I5022 Chronic systolic (congestive) heart failure: Secondary | ICD-10-CM

## 2020-05-03 LAB — BASIC METABOLIC PANEL
Anion gap: 8 (ref 5–15)
BUN: 24 mg/dL — ABNORMAL HIGH (ref 8–23)
CO2: 28 mmol/L (ref 22–32)
Calcium: 9 mg/dL (ref 8.9–10.3)
Chloride: 109 mmol/L (ref 98–111)
Creatinine, Ser: 1.26 mg/dL — ABNORMAL HIGH (ref 0.61–1.24)
GFR, Estimated: >60 mL/min (ref >60)
Glucose, Bld: 116 mg/dL — ABNORMAL HIGH (ref 70–99)
Potassium: 3.6 mmol/L (ref 3.5–5.1)
Sodium: 145 mmol/L (ref 135–145)

## 2020-05-03 LAB — ECHOCARDIOGRAM COMPLETE
Area-P 1/2: 1.82 cm2
Height: 69 in
P 1/2 time: 537 msec
S' Lateral: 5.3 cm
Weight: 2160 oz

## 2020-05-03 LAB — CBC
HCT: 28 % — ABNORMAL LOW (ref 39.0–52.0)
Hemoglobin: 9.3 g/dL — ABNORMAL LOW (ref 13.0–17.0)
MCH: 31.8 pg (ref 26.0–34.0)
MCHC: 33.2 g/dL (ref 30.0–36.0)
MCV: 95.9 fL (ref 80.0–100.0)
Platelets: 122 10*3/uL — ABNORMAL LOW (ref 150–400)
RBC: 2.92 MIL/uL — ABNORMAL LOW (ref 4.22–5.81)
RDW: 13 % (ref 11.5–15.5)
WBC: 8.1 10*3/uL (ref 4.0–10.5)
nRBC: 0 % (ref 0.0–0.2)

## 2020-05-03 LAB — APTT: aPTT: 115 seconds — ABNORMAL HIGH (ref 24–36)

## 2020-05-03 LAB — HEPARIN LEVEL (UNFRACTIONATED): Heparin Unfractionated: 1.58 IU/mL — ABNORMAL HIGH (ref 0.30–0.70)

## 2020-05-03 MED ORDER — APIXABAN 5 MG PO TABS
10.0000 mg | ORAL_TABLET | Freq: Two times a day (BID) | ORAL | Status: DC
  Administered 2020-05-03 – 2020-05-06 (×7): 10 mg via ORAL
  Filled 2020-05-03 (×8): qty 2

## 2020-05-03 MED ORDER — ENSURE ENLIVE PO LIQD
237.0000 mL | Freq: Three times a day (TID) | ORAL | Status: DC
  Administered 2020-05-03 – 2020-05-06 (×8): 237 mL via ORAL

## 2020-05-03 MED ORDER — APIXABAN 5 MG PO TABS: 5.0000 mg | ORAL_TABLET | Freq: Two times a day (BID) | ORAL | Status: DC

## 2020-05-03 MED ORDER — CARVEDILOL 6.25 MG PO TABS
6.2500 mg | ORAL_TABLET | Freq: Two times a day (BID) | ORAL | Status: DC
  Administered 2020-05-03 – 2020-05-06 (×6): 6.25 mg via ORAL
  Filled 2020-05-03 (×6): qty 1

## 2020-05-03 MED ORDER — ADULT MULTIVITAMIN W/MINERALS CH
1.0000 | ORAL_TABLET | Freq: Every day | ORAL | Status: DC
  Administered 2020-05-03 – 2020-05-06 (×4): 1 via ORAL
  Filled 2020-05-03 (×4): qty 1

## 2020-05-03 NOTE — Progress Notes (Signed)
Lower extremity venous has been completed.   Preliminary results in CV Proc.   Blanch Media 05/03/2020 9:26 AM

## 2020-05-03 NOTE — Progress Notes (Signed)
Initial Nutrition Assessment  DOCUMENTATION CODES:   Non-severe (moderate) malnutrition in context of chronic illness  INTERVENTION:    Ensure Enlive po TID, each supplement provides 350 kcal and 20 grams of protein  MVI daily   NUTRITION DIAGNOSIS:   Moderate Malnutrition related to chronic illness (CHF/DVT) as evidenced by moderate fat depletion,severe muscle depletion.  GOAL:   Patient will meet greater than or equal to 90% of their needs  MONITOR:   PO intake,Supplement acceptance,Weight trends,Labs,I & O's,Skin  REASON FOR ASSESSMENT:   Consult Assessment of nutrition requirement/status  ASSESSMENT:   Patient with PMH significant for CAD, CHF, CVA, CKD III, HTN, dyslipidemia, DVT RLE, and PE. Presents this admission with acute segmental and subsegmental R lower pulmonary emboli.  Pt denies loss in appetite PTA. States he typically consumes three meal daily that consist of B-eggs, toast, bacon L- banana, mayo sandwich D- more eggs, and toast. Does not use supplementation. Appetite stable this admission. Last meal completion charted as 100%. Discussed the importance of protein intake for preservation of lean body mass. Pt willing to try Ensure.   Pt endorses a UBW of 140 lb and denies wt loss. Records indicate pt weighed 66.2 kg on 1/18 and 61.2 kg this admission. Suspect dry wt loss but unable to quantify given history of CHF.   Medications: Vitamin D Labs: CBG 116-159  NUTRITION - FOCUSED PHYSICAL EXAM:  Flowsheet Row Most Recent Value  Orbital Region Mild depletion  Upper Arm Region Moderate depletion  Thoracic and Lumbar Region Unable to assess  Buccal Region Moderate depletion  Temple Region Severe depletion  Clavicle Bone Region Severe depletion  Clavicle and Acromion Bone Region Severe depletion  Scapular Bone Region Unable to assess  Dorsal Hand Severe depletion  Patellar Region Moderate depletion  Anterior Thigh Region Severe depletion  Posterior  Calf Region Moderate depletion  Edema (RD Assessment) Mild  Hair Reviewed  Eyes Reviewed  Mouth Reviewed  Skin Reviewed  Nails Reviewed     Diet Order:   Diet Order            Diet Heart Room service appropriate? Yes with Assist; Fluid consistency: Thin  Diet effective now                 EDUCATION NEEDS:   Education needs have been addressed  Skin:  Skin Assessment: Reviewed RN Assessment  Last BM:  12/19  Height:   Ht Readings from Last 1 Encounters:  05/03/20 5\' 9"  (1.753 m)    Weight:   Wt Readings from Last 1 Encounters:  05/03/20 61.2 kg    BMI:  Body mass index is 19.94 kg/m.  Estimated Nutritional Needs:   Kcal:  1850-2050 kcal  Protein:  95-115 grams  Fluid:  >/= 1.8 L/day   05/05/20 RD, LDN Clinical Nutrition Pager listed in AMION

## 2020-05-03 NOTE — Progress Notes (Signed)
Received pt. From ED. CCMD notified with Heparin going at prescribed rate. Pt. Is GCS-15 with VSS. Orders given and carried out. See PCR for vitals.
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
## 2020-05-03 NOTE — Progress Notes (Signed)
Mobility Specialist: Progress Note   05/03/20 1404  Mobility  Activity Ambulated in room  Level of Assistance Minimal assist, patient does 75% or more  Assistive Device Front wheel walker  Distance Ambulated (ft) 28 ft  Mobility Response Tolerated well  Mobility performed by Mobility specialist  $Mobility charge 1 Mobility   Pre-Mobility: 70 HR, 110/74 BP, 100% SpO2 Post-Mobility: 69 HR, 109/68 BP  Pt ambulated to the door and back in room 2x. Pt c/o 10/10 pain in his RLE before and during ambulation. Pt required frequent cues for hand and RW placement during ambulation.   [facility] Dasiah Hooley Mobility Specialist
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
## 2020-05-03 NOTE — Progress Notes (Signed)
Heart Failure Stewardship Pharmacist Progress Note   PCP: Lucky Cowboy, MD PCP-Cardiologist: No primary care provider on file.    HPI:  74 yo M with PMH of CAD, CHF, CVA, DVT/PE on Xarelto, HTN, and illiteracy. He presented to the ED following a fall. CT angio was positive for acute PE and suggestive of right heart failure. Last ECHO from 12/2015 notable for LVEF of 45%, normal RV function. Repeat ECHO done 05/03/20, results pending.  Current HF Medications: Carvedilol 6.25 mg BID  Prior to admission HF Medications: Furosemide 20 mg daily Carvedilol 25 mg BID  Pertinent Lab Values: Serum creatinine 1.26, BUN 24, Potassium 3.6, Sodium 145  Vital Signs: . Weight: 135 lbs (admission weight: 135 lbs) . Blood pressure: 110/60s  . Heart rate: 60s   Medication Assistance / Insurance Benefits Check: Does the patient have prescription insurance?  Yes Type of insurance plan: [employer]  Does the patient qualify for medication assistance through manufacturers or grants?   Pending household income information . Eligible grants and/or patient assistance programs: pending . Medication assistance applications in progress: none  . Medication assistance applications approved: none Approved medication assistance renewals will be completed by: TBD  Outpatient Pharmacy:  Prior to admission outpatient pharmacy: [facility] Is the patient willing to use [facility] TOC pharmacy at discharge? Pending Is the patient willing to transition their outpatient pharmacy to utilize a [facility] outpatient pharmacy?   Pending    Assessment: 1. Chronic systolic CHF (EF 98% in 2017, repeat pending). NYHA class II symptoms. - PTA furosemide on hold - Continue carvedilol 6.25 mg BID   Plan: 1) Medication changes recommended at this time: - None at this time; ECHO pending  2) Patient assistance: - Noted history of illiteracy and admitted following a fall at home.  - [facility] will send ambulatory  referral to [facility] to hopefully enroll in paramedicine program following discharge.   3)  Education  - To be completed prior to discharge  Sharen Hones, PharmD, BCPS Heart Failure Stewardship Pharmacist Phone [phone]

## 2020-05-03 NOTE — Discharge Instructions (Signed)
Information on my medicine - ELIQUIS (apixaban)  This medication education was reviewed with me or my healthcare representative as part of my discharge preparation.    Why was Eliquis prescribed for you? Eliquis was prescribed to treat blood clots that may have been found in the veins of your legs (deep vein thrombosis) or in your lungs (pulmonary embolism) and to reduce the risk of them occurring again.  What do You need to know about Eliquis ? The starting dose is 10 mg (two 5 mg tablets) taken TWICE daily for the FIRST SEVEN (7) DAYS, then on 05/10/2020  the dose is reduced to ONE 5 mg tablet taken TWICE daily.  Eliquis may be taken with or without food.   Try to take the dose about the same time in the morning and in the evening. If you have difficulty swallowing the tablet whole please discuss with your pharmacist how to take the medication safely.  Take Eliquis exactly as prescribed and DO NOT stop taking Eliquis without talking to the doctor who prescribed the medication.  Stopping may increase your risk of developing a new blood clot.  Refill your prescription before you run out.  After discharge, you should have regular check-up appointments with your healthcare provider that is prescribing your Eliquis.    What do you do if you miss a dose? If a dose of ELIQUIS is not taken at the scheduled time, take it as soon as possible on the same day and twice-daily administration should be resumed. The dose should not be doubled to make up for a missed dose.  Important Safety Information A possible side effect of Eliquis is bleeding. You should call your healthcare provider right away if you experience any of the following: ? Bleeding from an injury or your nose that does not stop. ? Unusual colored urine (red or dark brown) or unusual colored stools (red or black). ? Unusual bruising for unknown reasons. ? A serious fall or if you hit your head (even if there is no  bleeding).  Some medicines may interact with Eliquis and might increase your risk of bleeding or clotting while on Eliquis. To help avoid this, consult your healthcare provider or pharmacist prior to using any new prescription or non-prescription medications, including herbals, vitamins, non-steroidal anti-inflammatory drugs (NSAIDs) and supplements.  This website has more information on Eliquis (apixaban): http://www.eliquis.com/eliquis/home

## 2020-05-03 NOTE — Progress Notes (Signed)
  Echocardiogram 2D Echocardiogram has been performed.  Delcie Roch 05/03/2020, 11:46 AM

## 2020-05-03 NOTE — Progress Notes (Signed)
ANTICOAGULATION CONSULT NOTE Pharmacy Consult for Heparin  Indication: pulmonary embolus  Allergies  Allergen Reactions  . Lisinopril Other (See Comments)    angioedema    Patient Measurements: Height: 5\' 9"  (175.3 cm) Weight: 61.2 kg (135 lb) IBW/kg (Calculated) : 70.7 Heparin Dosing Weight: 61.2 kg  Vital Signs: Temp: 97.5 F (36.4 C) (12/20 0251) Temp Source: Oral (12/20 0251) BP: 111/66 (12/20 0251) Pulse Rate: 68 (12/20 0251)  Labs: Recent Labs    05/02/20 1050 05/02/20 1401 05/03/20 0157  HGB 10.0*  --   --   HCT 30.5*  --   --   PLT 141*  --   --   APTT  --   --  115*  HEPARINUNFRC  --   --  1.56*  CREATININE 1.41*  --   --   CKTOTAL  --  2,388*  --     Estimated Creatinine Clearance: 41 mL/min (A) (by C-G formula based on SCr of 1.41 mg/dL (H)).   Assessment: 74 y.o. male with h/o PE, found to have new PE, for heparin  Goal of Therapy:  Heparin level 0.3-0.7 units/ml Heparin level 66-102 units/ml Monitor platelets by anticoagulation protocol: Yes   Plan:  -Decrease Heparin 950 units/hr APTT in 8 hours  Jamelle Noy, 63 PharmD. BCPS  05/03/2020,3:02 AM

## 2020-05-03 NOTE — Progress Notes (Signed)
OT Cancellation Note  Patient Details Name: Dennis [name] MRN: [id number] DOB: November 09, 1945   Cancelled Treatment:    Reason Eval/Treat Not Completed: Patient not medically ready Pt admitted with pulmonary embolism. Started on heparin 12/19 at 1936. Will hold therapy at this time as heparin needs to be initiated 24hrs prior to initiating evaluation. Will return as time allows and pt is appropriate.   [facility] OTR/L Acute Rehabilitation Services Office: [phone]   Rebeca Alert 05/03/2020, 8:39 AM

## 2020-05-03 NOTE — Evaluation (Signed)
Physical Therapy Evaluation Patient Details Name: Dennis [name] MRN: [id number] DOB: August 13, 1945 Today's Date: 05/03/2020   History of Present Illness  Patient is a 74 y/o male who presents s/p falls x2, weakness, RLE swelling and SOB. Found to be hypoxic on arrival. Chest CT- acute PE in RLL. PMH includes depression, dementia, CVA (2013), PE, HTN, CHF, CAD, cardiomyopathy, illiteracy,  Clinical Impression  Patient presents with RLE pain, impaired cognition, impaired balance, decreased activity tolerance and impaired mobility s/p above. Pt not the best historian due to hx of dementia but reports he gets assist with ADLs at baseline and uses RW for ambulation. Per chart, pt with falls. Today, pt requires Mod-Min A for bed mobility, transfers and gait training. Gait limited due to pain through RLE with WB and in dependent position. Noted to have some RLE swelling, which pt reports has improved. Pt continues to be a high fall risk. Would benefit from SNF to maximize independence and mobility prior to return home. If caregiver able to provide 24/7 supervision/assist for all mobility at this level, pt safe to return home with HHPT.     Follow Up Recommendations SNF;Supervision for mobility/OOB    Equipment Recommendations  None recommended by PT    Recommendations for Other Services       Precautions / Restrictions Precautions Precautions: Fall Restrictions Weight Bearing Restrictions: No      Mobility  Bed Mobility Overal bed mobility: Needs Assistance Bed Mobility: Supine to Sit     Supine to sit: Mod assist;HOB elevated     General bed mobility comments: Step by step cues for sequencing; able to reach for rail, assist with LEs, trunk and scooting bottom to EOB. Limited by pain in RLE.    Transfers Overall transfer level: Needs assistance Equipment used: Rolling walker (2 wheeled) Transfers: Sit to/from Stand Sit to Stand: Min assist         General transfer comment:  ASsist to power to standing wtih cues for hand placement as pt pulling up on RW, cues for upright.  Ambulation/Gait Ambulation/Gait assistance: Min assist Gait Distance (Feet): 6 Feet (x2 bouts) Assistive device: Rolling walker (2 wheeled) Gait Pattern/deviations: Step-to pattern;Decreased stance time - right;Trunk flexed Gait velocity: decreased Gait velocity interpretation: <1.31 ft/sec, indicative of household ambulator General Gait Details: Slow, unsteady gait with RW too far anterior, cues for RW proximity; limited ability to place weight through RLE.  Stairs            Wheelchair Mobility    Modified Rankin (Stroke Patients Only)       Balance Overall balance assessment: History of Falls;Needs assistance Sitting-balance support: Feet supported;Bilateral upper extremity supported Sitting balance-Leahy Scale: Fair Sitting balance - Comments: supervision for safety, pain limiting with RLE in dependent position   Standing balance support: During functional activity Standing balance-Leahy Scale: Poor Standing balance comment: Requires UE support and external support.                             Pertinent Vitals/Pain Pain Assessment: Faces Faces Pain Scale: Hurts whole lot Pain Location: RLE with mobility Pain Descriptors / Indicators: Sore;Aching;Guarding;Grimacing;Moaning Pain Intervention(s): Monitored during session;Repositioned;Limited activity within patient's tolerance    Home Living Family/patient expects to be discharged to:: Private residence Living Arrangements: Spouse/significant other Available Help at Discharge: Family;Available 24 hours/day Type of Home: House Home Access: Stairs to enter Entrance Stairs-Rails: Right;Left;Can reach both Entrance Stairs-Number of Steps: 2 Home Layout: One  level Home Equipment: Walker - 2 wheels;Cane - single point;Bedside commode;Shower seat      Prior Function Level of Independence: Needs assistance    Gait / Transfers Assistance Needed: UseS RW vs SPC as needed. Fall history  ADL's / Homemaking Assistance Needed: Has an aide/caregiver help with dressing/bathing.  Comments: Pt not the best historian so info taken from prior admission.     Hand Dominance   Dominant Hand: Right    Extremity/Trunk Assessment   Upper Extremity Assessment Upper Extremity Assessment: Defer to OT evaluation    Lower Extremity Assessment Lower Extremity Assessment: Generalized weakness;Difficult to assess due to impaired cognition       Communication   Communication: No difficulties  Cognition Arousal/Alertness: Awake/alert Behavior During Therapy: Impulsive Overall Cognitive Status: Impaired/Different from baseline Area of Impairment: Orientation;Following commands;Problem solving                 Orientation Level: Disoriented to;Time;Situation     Following Commands: Follows one step commands with increased time     Problem Solving: Difficulty sequencing;Slow processing;Requires verbal cues General Comments: Pt impulsive with mobility; distracted by pain. Needs repetition to follow commands. Repeatedly saying he is going home despite being unable to ambulate. Poor awarenss of deficits.      General Comments General comments (skin integrity, edema, etc.): VSS on RA.    Exercises     Assessment/Plan    PT Assessment Patient needs continued PT services  PT Problem List Decreased strength;Decreased mobility;Decreased safety awareness;Decreased balance;Pain;Decreased knowledge of use of DME;Decreased cognition;Decreased activity tolerance       PT Treatment Interventions Therapeutic activities;Cognitive remediation;DME instruction;Gait training;Therapeutic exercise;Patient/family education;Balance training;Stair training;Functional mobility training    PT Goals (Current goals can be found in the Care Plan section)  Acute Rehab PT Goals Patient Stated Goal: to go home PT Goal  Formulation: With patient Time For Goal Achievement: 05/17/20 Potential to Achieve Goals: Fair    Frequency Min 3X/week   Barriers to discharge        Co-evaluation               AM-PAC PT "6 Clicks" Mobility  Outcome Measure Help needed turning from your back to your side while in a flat bed without using bedrails?: A Little Help needed moving from lying on your back to sitting on the side of a flat bed without using bedrails?: A Lot Help needed moving to and from a bed to a chair (including a wheelchair)?: A Little Help needed standing up from a chair using your arms (e.g., wheelchair or bedside chair)?: A Little Help needed to walk in hospital room?: A Little Help needed climbing 3-5 steps with a railing? : A Lot 6 Click Score: 16    End of Session Equipment Utilized During Treatment: Gait belt Activity Tolerance: Patient limited by pain Patient left: in bed;with call bell/phone within reach;with bed alarm set Nurse Communication: Mobility status PT Visit Diagnosis: Pain;Difficulty in walking, not elsewhere classified (R26.2);Muscle weakness (generalized) (M62.81);Unsteadiness on feet (R26.81) Pain - Right/Left: Right Pain - part of body: Leg    Time: 8242-3536 PT Time Calculation (min) (ACUTE ONLY): 18 min   Charges:   PT Evaluation $PT Eval Moderate Complexity: 1 Mod          Vale Haven, PT, DPT Acute Rehabilitation Services Pager [phone] Office [phone]      Dennis [name] 05/03/2020, 10:33 AM

## 2020-05-04 DIAGNOSIS — E440 Moderate protein-calorie malnutrition: Secondary | ICD-10-CM | POA: Insufficient documentation

## 2020-05-04 LAB — CBC
HCT: 25.6 % — ABNORMAL LOW (ref 39.0–52.0)
Hemoglobin: 8.6 g/dL — ABNORMAL LOW (ref 13.0–17.0)
MCH: 32.1 pg (ref 26.0–34.0)
MCHC: 33.6 g/dL (ref 30.0–36.0)
MCV: 95.5 fL (ref 80.0–100.0)
Platelets: 136 10*3/uL — ABNORMAL LOW (ref 150–400)
RBC: 2.68 MIL/uL — ABNORMAL LOW (ref 4.22–5.81)
RDW: 12.9 % (ref 11.5–15.5)
WBC: 7.8 10*3/uL (ref 4.0–10.5)
nRBC: 0 % (ref 0.0–0.2)

## 2020-05-04 LAB — COMPREHENSIVE METABOLIC PANEL
ALT: 44 U/L (ref 0–44)
AST: 49 U/L — ABNORMAL HIGH (ref 15–41)
Albumin: 2.5 g/dL — ABNORMAL LOW (ref 3.5–5.0)
Alkaline Phosphatase: 49 U/L (ref 38–126)
Anion gap: 5 (ref 5–15)
BUN: 26 mg/dL — ABNORMAL HIGH (ref 8–23)
CO2: 28 mmol/L (ref 22–32)
Calcium: 9.2 mg/dL (ref 8.9–10.3)
Chloride: 106 mmol/L (ref 98–111)
Creatinine, Ser: 1.32 mg/dL — ABNORMAL HIGH (ref 0.61–1.24)
GFR, Estimated: 57 mL/min — ABNORMAL LOW (ref >60)
Glucose, Bld: 120 mg/dL — ABNORMAL HIGH (ref 70–99)
Potassium: 4.3 mmol/L (ref 3.5–5.1)
Sodium: 139 mmol/L (ref 135–145)
Total Bilirubin: 0.9 mg/dL (ref 0.3–1.2)
Total Protein: 4.9 g/dL — ABNORMAL LOW (ref 6.5–8.1)

## 2020-05-04 NOTE — Progress Notes (Signed)
Physical Therapy Treatment Patient Details Name: Dennis [name] MRN: [id number] DOB: Dec 01, 1945 Today's Date: 05/04/2020    History of Present Illness Patient is a 74 y/o male who presents s/p falls x2, weakness, RLE swelling and SOB. Found to be hypoxic on arrival. Chest CT- acute PE in RLL. PMH includes depression, dementia, CVA (2013), PE, HTN, CHF, CAD, cardiomyopathy, illiteracy,    PT Comments    Pt supine in bed on arrival this session.  Pt required mod assistance to mobilize this session.  Pt very unsteady in standing and during gt training.  Pt continues to benefit from snf placement to improve strength and decrease caregiver burden.      Follow Up Recommendations  SNF;Supervision for mobility/OOB     Equipment Recommendations  None recommended by PT    Recommendations for Other Services       Precautions / Restrictions Precautions Precautions: Fall Restrictions Weight Bearing Restrictions: No    Mobility  Bed Mobility Overal bed mobility: Needs Assistance Bed Mobility: Supine to Sit     Supine to sit: Mod assist     General bed mobility comments: Mod assistance to progress hips to edge of bed with use of bed pad.  Transfers Overall transfer level: Needs assistance Equipment used: Rolling walker (2 wheeled) Transfers: Sit to/from Stand Sit to Stand: Mod assist;From elevated surface         General transfer comment: Mod assistance to rise into standing.  Pt with poor eccentric load back to seated surface.  Ambulation/Gait Ambulation/Gait assistance: Mod assist Gait Distance (Feet): 20 Feet Assistive device: Rolling walker (2 wheeled) Gait Pattern/deviations: Step-to pattern;Decreased stance time - right;Trunk flexed;Wide base of support Gait velocity: decreased   General Gait Details: Slow, unsteady gait with RW too far anterior, cues for RW proximity; limited ability to place weight through RLE. assistance to facilitate closer position to RW.  Cues  to decrease BOS.   Stairs             Wheelchair Mobility    Modified Rankin (Stroke Patients Only)       Balance Overall balance assessment: History of Falls;Needs assistance   Sitting balance-Leahy Scale: Fair       Standing balance-Leahy Scale: Poor                              Cognition Arousal/Alertness: Awake/alert Behavior During Therapy: Impulsive Overall Cognitive Status: History of cognitive impairments - at baseline                                 General Comments: Pt with baseline dementia.      Exercises General Exercises - Lower Extremity Long Arc Quad: AROM;Both;10 reps;Seated Heel Slides: AROM;Both;10 reps;AAROM;Supine Hip ABduction/ADduction: AROM;AAROM;Both;10 reps;Supine    General Comments        Pertinent Vitals/Pain Pain Assessment: Faces Faces Pain Scale: Hurts whole lot Pain Location: RLE with mobility Pain Descriptors / Indicators: Sore;Aching;Guarding;Grimacing;Moaning Pain Intervention(s): Monitored during session;Repositioned    Home Living                      Prior Function            PT Goals (current goals can now be found in the care plan section) Acute Rehab PT Goals Patient Stated Goal: to go home Potential to Achieve Goals: Fair Progress towards PT goals:  Progressing toward goals    Frequency    Min 3X/week      PT Plan Current plan remains appropriate    Co-evaluation              AM-PAC PT "6 Clicks" Mobility   Outcome Measure  Help needed turning from your back to your side while in a flat bed without using bedrails?: A Little Help needed moving from lying on your back to sitting on the side of a flat bed without using bedrails?: A Lot Help needed moving to and from a bed to a chair (including a wheelchair)?: A Little Help needed standing up from a chair using your arms (e.g., wheelchair or bedside chair)?: A Little Help needed to walk in hospital room?:  A Little Help needed climbing 3-5 steps with a railing? : A Lot 6 Click Score: 16    End of Session Equipment Utilized During Treatment: Gait belt Activity Tolerance: Patient limited by pain Patient left: in bed;with call bell/phone within reach;with bed alarm set Nurse Communication: Mobility status PT Visit Diagnosis: Pain;Difficulty in walking, not elsewhere classified (R26.2);Muscle weakness (generalized) (M62.81);Unsteadiness on feet (R26.81) Pain - Right/Left: Right Pain - part of body: Leg     Time: 8592-9244 PT Time Calculation (min) (ACUTE ONLY): 18 min  Charges:  $Gait Training: 8-22 mins                     Bonney Leitz , PTA Acute Rehabilitation Services Pager [phone] Office [phone]     Wilmoth Rasnic Artis Delay 05/04/2020, 4:00 PM

## 2020-05-04 NOTE — Progress Notes (Signed)
Mobility Specialist: Progress Note   05/04/20 1756  Mobility  Activity Ambulated in hall  Level of Assistance Moderate assist, patient does 50-74%  Assistive Device Front wheel walker  Distance Ambulated (ft) 40 ft  Mobility Response Tolerated fair  Mobility performed by Mobility specialist  $Mobility charge 1 Mobility   Pre-Mobility: 73 HR, 104/61 BP, 95% SpO2 Post-Mobility: 78 HR, 109/65 BP, 95% SpO2  Pt bowel incontinent upon entering room. Pt expressed he knew of his bowel movement but said he had already been cleaned up. Assisted RN with pericare. Pt c/o BLE weakness during ambulation. Pt back to bed per request after ambulation.   [facility] Dennis [name] Mobility Specialist
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
## 2020-05-04 NOTE — Evaluation (Signed)
Occupational Therapy Evaluation Patient Details Name: Dennis [name] MRN: [id number] DOB: [birth year] Today's Date: 05/04/2020    History of Present Illness Patient is a 74 y/o male who presents s/p falls x2, weakness, RLE swelling and SOB. Found to be hypoxic on arrival. Chest CT- acute PE in RLL. PMH includes depression, dementia, CVA (2013), PE, HTN, CHF, CAD, cardiomyopathy, illiteracy,   Clinical Impression   PTA, pt was living at home alone, with an aide to assist with ADL/IADL and pt reports he was modified independent at RW level. Pt is a poor historian, information gathered from chart. Pt demonstrates cognitive limitations (see cognition section) impacting his safety and independence with engaging in his environment. Pt currently required maxA to progress hips to EOB, he required maxA to powerup into standing. Pt was incontinent of bowels, reports he was aware he had BM but unsure why he didn't notify staff. Pt required totalA for posterior care as he has heavy reliance on BUE support in standing. Pt reports he is alone at times during the day and is not clear on how long his aide is at home with him. Pt is currently unsafe to discharge home without 24/7 physical assistance. Due to decline in current level of function, pt would benefit from acute OT to address established goals to facilitate safe D/C to venue listed below. At this time, recommend SNF follow-up. However, pt declining. Should he return home he will need 24/7 physical assistance. Will continue to follow acutely.     Follow Up Recommendations  SNF;Supervision/Assistance - 24 hour    Equipment Recommendations  3 in 1 bedside commode;Wheelchair (measurements OT)    Recommendations for Other Services       Precautions / Restrictions Precautions Precautions: Fall Restrictions Weight Bearing Restrictions: No      Mobility Bed Mobility Overal bed mobility: Needs Assistance Bed Mobility: Supine to Sit     Supine to  sit: Max assist;HOB elevated     General bed mobility comments: maxA to progress hips to EOB;pt requiring cues for sequencing    Transfers Overall transfer level: Needs assistance Equipment used: Rolling walker (2 wheeled) Transfers: Sit to/from Stand Sit to Stand: Max assist         General transfer comment: pt required maxA to powerup into standing, max vc for safe hand placement. Pt unable to progress hips off recliner/EOB without therapist maxA. despite max vc for safe hand placement, pt continued to attempt to pull up on RW    Balance Overall balance assessment: History of Falls;Needs assistance Sitting-balance support: Feet supported;Bilateral upper extremity supported Sitting balance-Leahy Scale: Fair Sitting balance - Comments: supervision for safety, pain limiting with RLE in dependent position   Standing balance support: During functional activity Standing balance-Leahy Scale: Poor Standing balance comment: heavy reliance on BUE support                           ADL either performed or assessed with clinical judgement   ADL Overall ADL's : Needs assistance/impaired Eating/Feeding: Set up;Sitting   Grooming: Set up;Sitting   Upper Body Bathing: Min guard;Sitting   Lower Body Bathing: Maximal assistance;Sit to/from stand   Upper Body Dressing : Min guard;Sitting   Lower Body Dressing: Maximal assistance;Sit to/from stand Lower Body Dressing Details (indicate cue type and reason): pt unable to access feet, required maxA to stand Toilet Transfer: Moderate assistance;Stand-pivot;RW Toilet Transfer Details (indicate cue type and reason): modA to stand-pivot from EOB to  recliner Toileting- Clothing Manipulation and Hygiene: Maximal assistance Toileting - Clothing Manipulation Details (indicate cue type and reason): totalA for posterior care, pt incontinent of bowels, reports he was aware he had BM pt unsure why he didn't tell staff     Functional  mobility during ADLs: Moderate assistance;Rolling walker General ADL Comments: pt limited by cognition, BLE weakness and RLE pain, generalized weakness, decreased activity tolerance;HR up to 121bpm with stand-pivot     Vision Patient Visual Report: No change from baseline       Perception     Praxis      Pertinent Vitals/Pain Pain Assessment: Faces Faces Pain Scale: Hurts whole lot Pain Location: RLE with mobility Pain Descriptors / Indicators: Sore;Aching;Guarding;Grimacing;Moaning Pain Intervention(s): Monitored during session;Limited activity within patient's tolerance     Hand Dominance Right   Extremity/Trunk Assessment Upper Extremity Assessment Upper Extremity Assessment: Overall WFL for tasks assessed   Lower Extremity Assessment Lower Extremity Assessment: Generalized weakness   Cervical / Trunk Assessment Cervical / Trunk Assessment: Kyphotic   Communication Communication Communication: No difficulties   Cognition Arousal/Alertness: Awake/alert Behavior During Therapy: Impulsive Overall Cognitive Status: Impaired/Different from baseline Area of Impairment: Orientation;Following commands;Problem solving;Attention;Memory;Safety/judgement;Awareness                 Orientation Level: Disoriented to;Time;Situation Current Attention Level: Sustained Memory: Decreased short-term memory Following Commands: Follows one step commands with increased time Safety/Judgement: Decreased awareness of safety;Decreased awareness of deficits Awareness: Emergent Problem Solving: Difficulty sequencing;Slow processing;Requires verbal cues;Requires tactile cues General Comments: pt with decreased awareness of deficits decreased awareness of safety, pt requiring max multimodal cues for safe hand placement, pt with poor memory, poor recall of PLOF, vague with information provided;Pt stated "Ill just get up" when asked what he would do if he fell.   General Comments  HR 100bpm  at rest 121bpm with exertion    Exercises     Shoulder Instructions      Home Living Family/patient expects to be discharged to:: Private residence Living Arrangements: Spouse/significant other Available Help at Discharge: Family;Available 24 hours/day Type of Home: House Home Access: Stairs to enter [employer] of Steps: 2 Entrance Stairs-Rails: Right;Left;Can reach both Home Layout: One level     Bathroom Shower/Tub: Walk-in shower;Tub/shower unit   Bathroom Toilet: Standard     Home Equipment: Environmental consultant - 2 wheels;Cane - single point;Bedside commode;Shower seat   Additional Comments: aide who comes M-F pt vague about about how long aide is there for      Prior Functioning/Environment Level of Independence: Needs assistance  Gait / Transfers Assistance Needed: UseS RW vs SPC as needed. Fall history ADL's / Homemaking Assistance Needed: Has an aide/caregiver help with dressing/bathing.   Comments: Pt not the best historian so info taken from prior admission.        OT Problem List: Decreased strength;Decreased range of motion;Decreased activity tolerance;Impaired balance (sitting and/or standing);Decreased safety awareness;Decreased cognition;Decreased knowledge of use of DME or AE;Cardiopulmonary status limiting activity;Pain      OT Treatment/Interventions: Self-care/ADL training;Therapeutic exercise;Energy conservation;DME and/or AE instruction;Therapeutic activities;Cognitive remediation/compensation;Patient/family education;Balance training    OT Goals(Current goals can be found in the care plan section) Acute Rehab OT Goals Patient Stated Goal: to go home OT Goal Formulation: With patient Time For Goal Achievement: 05/18/20 Potential to Achieve Goals: Good ADL Goals Pt Will Perform Grooming: sitting;with modified independence Pt Will Perform Lower Body Dressing: with modified independence;sit to/from stand Pt Will Transfer to Toilet: ambulating;with  supervision Additional ADL Goal #1:  Pt will demonstrate anticipatory awareness for safe engagement in ADL/IADL and functional mobility.  OT Frequency: Min 2X/week   Barriers to D/C: Decreased caregiver support (pt reports he is alone at times during the day)          Co-evaluation              AM-PAC OT "6 Clicks" Daily Activity     Outcome Measure Help from another person eating meals?: A Little Help from another person taking care of personal grooming?: A Little Help from another person toileting, which includes using toliet, bedpan, or urinal?: A Lot Help from another person bathing (including washing, rinsing, drying)?: A Lot Help from another person to put on and taking off regular upper body clothing?: A Little Help from another person to put on and taking off regular lower body clothing?: A Lot 6 Click Score: 15   End of Session Equipment Utilized During Treatment: Gait belt;Rolling walker Nurse Communication: Mobility status  Activity Tolerance: Patient tolerated treatment well;Patient limited by pain Patient left: with call bell/phone within reach;in chair;with chair alarm set  OT Visit Diagnosis: Unsteadiness on feet (R26.81);Other abnormalities of gait and mobility (R26.89);Muscle weakness (generalized) (M62.81);Pain;Other symptoms and signs involving cognitive function;History of falling (Z91.81)                Time: 2297-9892 OT Time Calculation (min): 33 min Charges:  OT General Charges $OT Visit: 1 Visit OT Evaluation $OT Eval Moderate Complexity: 1 Mod OT Treatments $Self Care/Home Management : 8-22 mins  Rosey Bath OTR/L Acute Rehabilitation Services Office: [phone]   Rebeca Alert 05/04/2020, 10:16 AM

## 2020-05-04 NOTE — NC FL2 (Signed)
[place] MEDICAID FL2 LEVEL OF CARE SCREENING TOOL     IDENTIFICATION  Patient Name: Dennis [name] Birthdate: 02/27/1946 Sex: male Admission Date (Current Location): 05/02/2020  [facility] and [place] Number:  Producer, television/film/video and Address:  [facility], [address], [place], [place] [address]      Provider Number: [id number]  Attending Physician Name and Address:  Zannie Cove, MD  Relative Name and Phone Number:  Matthew Folks [phone]    Current Level of Care: Hospital Recommended Level of Care: Skilled Nursing Facility Prior Approval Number:    Date Approved/Denied:   PASRR Number: [id number]  Discharge Plan: SNF    Current Diagnoses: Patient Active Problem List   Diagnosis Date Noted  . Malnutrition of moderate degree 05/04/2020  . Pulmonary embolism (HCC) 05/02/2020  . Thoracic ascending aortic aneurysm [facility]) by Chest CT scan 01/01/2016 03/22/2020  . CKD (chronic kidney disease) stage 3, GFR 30-59 ml/min (HCC) 10/31/2018  . History of pulmonary embolus (PE) 04/03/2017  . Primary hypercoagulable state (HCC) 01/29/2016  . History of DVT (deep vein thrombosis) 01/18/2016  . BPH (benign prostatic hyperplasia)   . Mild dementia (HCC) 07/16/2015  . Generalized anxiety disorder 01/04/2015  . HTN 07/01/2014  . Pulmonary hypertension (HCC) 04/24/2014  . Noncompliance 04/24/2014  . Chronic systolic heart failure (HCC)   . Medication management 11/18/2013  . Vitamin D deficiency   . Hypertensive heart disease   . Gout   . Abnormal glucose   . Hyperlipidemia, mixed   . History of ischemic vertebrobasilar artery cerebellar stroke     Orientation RESPIRATION BLADDER Height & Weight     Self,Time,Situation,Place  Normal Incontinent,External catheter (External Urinary Catheter) Weight: 135 lb (61.2 kg) Height:  5\' 9"  (175.3 cm)  BEHAVIORAL SYMPTOMS/MOOD NEUROLOGICAL BOWEL NUTRITION STATUS      Incontinent Diet (See Discharge Summary)   AMBULATORY STATUS COMMUNICATION OF NEEDS Skin   Limited Assist Verbally Normal                       Personal Care Assistance Level of Assistance  Bathing,Feeding,Dressing Bathing Assistance: Limited assistance Feeding assistance: Limited assistance Dressing Assistance: Limited assistance     Functional Limitations Info  Sight,Hearing,Speech Sight Info: Adequate Hearing Info: Adequate Speech Info: Adequate    SPECIAL CARE FACTORS FREQUENCY  PT (By licensed PT),OT (By licensed OT)     PT Frequency: 5x min weekly OT Frequency: 5x min weekly            Contractures Contractures Info: Not present    Additional Factors Info  Code Status,Allergies,Psychotropic Code Status Info: FULL Allergies Info: Lisinopril Psychotropic Info: sertraline (ZOLOFT) tablet 50 mg daily         Current Medications (05/04/2020):  This is the current hospital active medication list Current Facility-Administered Medications  Medication Dose Route Frequency Provider Last Rate Last Admin  . acetaminophen (TYLENOL) tablet 650 mg  650 mg Oral Q6H PRN Arrien, 05/06/2020, MD   650 mg at 05/03/20 1325   Or  . acetaminophen (TYLENOL) suppository 650 mg  650 mg Rectal Q6H PRN Arrien, 05/05/20, MD      . apixaban York Ram) tablet 10 mg  10 mg Oral BID Everlene Balls, RPH   10 mg at 05/04/20 0849   Followed by  . [START ON 05/10/2020] apixaban (ELIQUIS) tablet 5 mg  5 mg Oral BID 05/12/2020, RPH      . carvedilol (COREG)  tablet 6.25 mg  6.25 mg Oral BID WC Zannie Cove, MD   6.25 mg at 05/04/20 0849  . cholecalciferol (VITAMIN D3) tablet 2,000 Units  2,000 Units Oral BID Coralie Keens, MD   2,000 Units at 05/04/20 [phone]  . donepezil (ARICEPT) tablet 5 mg  5 mg Oral Daily Arrien, York Ram, MD   5 mg at 05/04/20 0849  . feeding supplement (ENSURE ENLIVE / ENSURE PLUS) liquid 237 mL  237 mL Oral TID BM Zannie Cove, MD   237 mL at 05/04/20 0851  . multivitamin with  minerals tablet 1 tablet  1 tablet Oral Daily Zannie Cove, MD   1 tablet at 05/04/20 0850  . ondansetron (ZOFRAN) tablet 4 mg  4 mg Oral Q6H PRN Arrien, York Ram, MD       Or  . ondansetron [facility]) injection 4 mg  4 mg Intravenous Q6H PRN Arrien, York Ram, MD      . pravastatin (PRAVACHOL) tablet 20 mg  20 mg Oral Daily Arrien, York Ram, MD   20 mg at 05/04/20 0849  . sertraline (ZOLOFT) tablet 50 mg  50 mg Oral Daily Arrien, York Ram, MD   50 mg at 05/04/20 0849  . tamsulosin (FLOMAX) capsule 0.4 mg  0.4 mg Oral QHS Arrien, York Ram, MD   0.4 mg at 05/03/20 2055     Discharge Medications: Please see discharge summary for a list of discharge medications.  Relevant Imaging Results:  Relevant Lab Results:   Additional Information [id number]  Terrial Rhodes, LCSWA

## 2020-05-04 NOTE — Progress Notes (Addendum)
Heart Failure Stewardship Pharmacist Progress Note   PCP: Lucky Cowboy, MD PCP-Cardiologist: No primary care provider on file.    HPI:  74 yo M with PMH of CAD, CHF, CVA, DVT/PE on Xarelto, HTN, and illiteracy. He presented to the ED following a fall. CT angio was positive for acute PE and suggestive of right heart failure. Last ECHO from 12/2015 notable for LVEF of 45%, normal RV function. Repeat ECHO done 05/03/20 and LVEF now reduced to 35-40%, RV ok.  Current HF Medications: Carvedilol 6.25 mg BID  Prior to admission HF Medications: Furosemide 20 mg daily Carvedilol 25 mg BID  Pertinent Lab Values: Serum creatinine 1.32, BUN 26, Potassium 4.3, Sodium 139  Vital Signs: . Weight: 135 lbs (admission weight: 135 lbs) . Blood pressure: 90-110/60s  . Heart rate: 60-70s   Medication Assistance / Insurance Benefits Check: Does the patient have prescription insurance?  Yes Type of insurance plan: [employer] Medicare  Does the patient qualify for medication assistance through manufacturers or grants?   Pending household income information . Eligible grants and/or patient assistance programs: pending . Medication assistance applications in progress: none  . Medication assistance applications approved: none Approved medication assistance renewals will be completed by: TBD  Outpatient Pharmacy:  Prior to admission outpatient pharmacy: [facility] Is the patient willing to use [facility] TOC pharmacy at discharge? Pending Is the patient willing to transition their outpatient pharmacy to utilize a [facility] outpatient pharmacy?   Pending    Assessment: 1. Chronic systolic CHF (EF 13% in 2017, repeat pending). NYHA class II symptoms. - PTA furosemide on hold - Continue carvedilol 6.25 mg BID - Consider starting low dose ARB if BP allows - last BP check too soft 94/61. - Consider starting spironolactone if BP allows - Consider starting SGLT2i prior to discharge (neutral BP effects)    Plan: 1) Medication changes recommended at this time: - Continue current regimen - BP too soft to add ARB. May be able to add SGLT2i  2) Patient assistance: - Noted history of illiteracy and admitted following a fall at home.  - [facility] will send ambulatory referral to [facility] to hopefully enroll in paramedicine program following discharge.  Marcelline Deist copay $140/month - if copay is unaffordable, can enroll in patient assistance through manufacturer  3)  Education  - To be completed prior to discharge  Sharen Hones, PharmD, BCPS Heart Failure Stewardship Pharmacist Phone [phone]

## 2020-05-04 NOTE — TOC Initial Note (Signed)
Transition of Care [facility]) - Initial/Assessment Note    Patient Details  Name: Dennis [name] MRN: [id number] Date of Birth: [birth year]  [facility] CM/SW Contact:    Terrial Rhodes, LCSWA Phone Number: 05/04/2020, 4:00 PM  Clinical Narrative:                  CSW received consult for possible SNF placement at time of discharge. CSW spoke with patients caseworker Dendra regarding PT recommendation of SNF placement at time of discharge. Patient is ward of the [facility] DSS.  Patients caseworker expressed understanding of PT recommendation and is agreeable to SNF placement at time of discharge for patient. Patients caseworker gave CSW permission to fax out initial referral near the [place] area. Patient has received the COVID vaccines. No further questions reported at this time. CSW to continue to follow and assist with discharge planning needs.   Expected Discharge Plan: Skilled Nursing Facility Barriers to Discharge: Continued Medical Work up   Patient Goals and CMS Choice   CMS Medicare.gov Compare Post Acute Care list provided to:: Patient Represenative (must comment) Biochemist, clinical caseworker) Choice offered to / list presented to : NA Scientist, research (medical))  Expected Discharge Plan and Services Expected Discharge Plan: Skilled Nursing Facility       Living arrangements for the past 2 months: Single Family Home                                      Prior Living Arrangements/Services Living arrangements for the past 2 months: Single Family Home Lives with:: Self,Other (Comment) (caregiver Janette) Patient language and need for interpreter reviewed:: Yes Do you feel safe going back to the place where you live?: No   SNF  Need for Family Participation in Patient Care: Yes (Comment) Care giver support system in place?: Yes (comment)   Criminal Activity/Legal Involvement Pertinent to Current Situation/Hospitalization: No - Comment as needed  Activities of  Daily Living Home Assistive Devices/Equipment: Walker (specify type) ADL Screening (condition at time of admission) Patient's cognitive ability adequate to safely complete daily activities?: Yes Is the patient deaf or have difficulty hearing?: No Does the patient have difficulty seeing, even when wearing glasses/contacts?: No Does the patient have difficulty concentrating, remembering, or making decisions?: No Patient able to express need for assistance with ADLs?: Yes Does the patient have difficulty dressing or bathing?: No Independently performs ADLs?: Yes (appropriate for developmental age) Does the patient have difficulty walking or climbing stairs?: Yes Weakness of Legs: Right Weakness of Arms/Hands: None  Permission Sought/Granted Permission sought to share information with : Case Manager,Family Electrical engineer Permission granted to share information with : Yes, Verbal Permission Granted  Share Information with NAME: Dendra and Janette  Permission granted to share info w AGENCY: SNF  Permission granted to share info w Relationship: caseworker, caregiver  Permission granted to share info w Contact Information: dendra [phone], [phone]  Emotional Assessment       Orientation: : Oriented to Self,Oriented to Place,Oriented to  Time,Oriented to Situation Alcohol / Substance Use: Not Applicable Psych Involvement: No (comment)  Admission diagnosis:  Pulmonary embolism (HCC) [I26.99] Acute pulmonary embolism without acute cor pulmonale, unspecified pulmonary embolism type (HCC) [I26.99] Patient Active Problem List   Diagnosis Date Noted  . Malnutrition of moderate degree 05/04/2020  . Pulmonary embolism (HCC) 05/02/2020  . Thoracic ascending aortic aneurysm (HCC) by Chest CT scan  01/01/2016 03/22/2020  . CKD (chronic kidney disease) stage 3, GFR 30-59 ml/min (HCC) 10/31/2018  . History of pulmonary embolus (PE) 04/03/2017  . Primary  hypercoagulable state (HCC) 01/29/2016  . History of DVT (deep vein thrombosis) 01/18/2016  . BPH (benign prostatic hyperplasia)   . Mild dementia (HCC) 07/16/2015  . Generalized anxiety disorder 01/04/2015  . HTN 07/01/2014  . Pulmonary hypertension (HCC) 04/24/2014  . Noncompliance 04/24/2014  . Chronic systolic heart failure (HCC)   . Medication management 11/18/2013  . Vitamin D deficiency   . Hypertensive heart disease   . Gout   . Abnormal glucose   . Hyperlipidemia, mixed   . History of ischemic vertebrobasilar artery cerebellar stroke    PCP:  Lucky Cowboy, MD Pharmacy:   [facility] - [place], [place] - [address] [place] [address] Phone: [phone] Fax: [phone]  [facility] [phone] - [place], [place] - [address] AT [facility] [address] [address] & [address] [place] [address] Phone: [phone] Fax: [phone]     Social Determinants of Health (SDOH) Interventions    Readmission Risk Interventions No flowsheet data found.

## 2020-05-04 NOTE — TOC Benefit Eligibility Note (Signed)
Transition of Care [facility] [place] [facility]) Benefit Eligibility Note    Patient Details  Name: Dennis [name] MRN: [id number] Date of Birth: 1945/08/18   Medication/Dose: Arne Cleveland  5 MG BID  Covered?: Yes  Tier: 3 Drug  Prescription Coverage Preferred Pharmacy: Roseanne Kaufman with Person/Company/Phone Number:: DEE  @ [place] [facility] # [phone]  Co-Pay: $121.84  Prior Approval: No  Deductible: Met       Memory Argue Phone Number: 05/04/2020, 12:40 PM

## 2020-05-05 DIAGNOSIS — I2699 Other pulmonary embolism without acute cor pulmonale: Principal | ICD-10-CM

## 2020-05-05 LAB — BASIC METABOLIC PANEL
Anion gap: 8 (ref 5–15)
BUN: 29 mg/dL — ABNORMAL HIGH (ref 8–23)
CO2: 28 mmol/L (ref 22–32)
Calcium: 8.9 mg/dL (ref 8.9–10.3)
Chloride: 103 mmol/L (ref 98–111)
Creatinine, Ser: 1.52 mg/dL — ABNORMAL HIGH (ref 0.61–1.24)
GFR, Estimated: 48 mL/min — ABNORMAL LOW (ref >60)
Glucose, Bld: 122 mg/dL — ABNORMAL HIGH (ref 70–99)
Potassium: 4.3 mmol/L (ref 3.5–5.1)
Sodium: 139 mmol/L (ref 135–145)

## 2020-05-05 LAB — RESP PANEL BY RT-PCR (FLU A&B, COVID) ARPGX2
Influenza A by PCR: NEGATIVE
Influenza B by PCR: NEGATIVE
SARS Coronavirus 2 by RT PCR: NEGATIVE
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
## 2020-05-05 NOTE — Progress Notes (Addendum)
Mobility Specialist: Progress Note   05/05/20 1653  Mobility  Activity Stood at bedside  Level of Assistance Maximum assist, patient does 25-49%  Assistive Device Front wheel walker  Mobility Response Tolerated poorly  Mobility performed by Mobility specialist  $Mobility charge 1 Mobility   Pre-Mobility: 84 HR Post-Mobility: 76 HR  Pt stood at bedside for 1-2 minutes. Pt's mobility limited due to pain in R leg. Pt c/o "20/10" pain level in his R leg and was maxA to EOB, modA to stand. Pt bowel incontinent and was assisted with pericare.   [facility] Mobility Specialist

## 2020-05-05 NOTE — Plan of Care (Signed)
  Problem: Clinical Measurements: Goal: Will remain free from infection Outcome: Progressing   Problem: Activity: Goal: Risk for activity intolerance will decrease Outcome: Not Progressing   

## 2020-05-05 NOTE — TOC Progression Note (Signed)
Transition of Care [facility]) - Progression Note    Patient Details  Name: AVEL OGAWA MRN: [id number] Date of Birth: May 18, 1945  [facility]) CM/SW Contact  Eduard Roux, [place] Phone Number: 05/05/2020, 4:24 PM  Clinical Narrative:     CSW received call from [facility] has received insurance authorization.  Lac[facility] Axon confirmed they can admit tomorrow.  Antony Blackbird, MSW, LCSW Clinical Social Worker   Expected Discharge Plan: Skilled Nursing Facility Barriers to Discharge: Continued Medical Work up  Expected Discharge Plan and Services Expected Discharge Plan: Skilled Nursing Facility       Living arrangements for the past 2 months: Single Family Home                                       Social Determinants of Health (SDOH) Interventions    Readmission Risk Interventions No flowsheet data found.

## 2020-05-05 NOTE — TOC Progression Note (Signed)
Transition of Care [facility]) - Progression Note    Patient Details  Name: KHARY SCHABEN MRN: [id number] Date of Birth: 07/27/1945  [facility] Care [facility]) CM/SW Contact  Eduard Roux, [place] Phone Number: 05/05/2020, 11:30 AM  Clinical Narrative:     CSW spoke with patient's DSS Guardian Dendra-CSW provided bed offers. Dendra selected [facility] and [facility].  Lacinda Axon confirmed bed offer and will start insurance authorization process. Patient will need another covid test.  CSW will continue to follow and assist with discharge planning.   Antony Blackbird, MSW, LCSW Clinical Social Worker   Expected Discharge Plan: Skilled Nursing Facility Barriers to Discharge: Continued Medical Work up  Expected Discharge Plan and Services Expected Discharge Plan: Skilled Nursing Facility       Living arrangements for the past 2 months: Single Family Home                                       Social Determinants of Health (SDOH) Interventions    Readmission Risk Interventions No flowsheet data found.

## 2020-05-05 NOTE — Progress Notes (Addendum)
Heart Failure Stewardship Pharmacist Progress Note   PCP: Dennis Cowboy, MD PCP-Cardiologist: No primary care provider on file.    HPI:  74 yo M with PMH of CAD, CHF, CVA, DVT/PE on Xarelto, HTN, and illiteracy. He presented to the ED following a fall. CT angio was positive for acute PE and suggestive of right heart failure. Last ECHO from 12/2015 notable for LVEF of 45%, normal RV function. Repeat ECHO done 05/03/20 and LVEF now reduced to 35-40%, RV ok.  Current HF Medications: Carvedilol 6.25 mg BID  Prior to admission HF Medications: Furosemide 20 mg daily Carvedilol 25 mg BID  Pertinent Lab Values: Serum creatinine 1.52, BUN 29, Potassium 4.3, Sodium 139  Vital Signs: . Weight: 135 lbs (admission weight: 135 lbs) . Blood pressure: 90-110/60-70s  . Heart rate: 60-80s   Medication Assistance / Insurance Benefits Check: Does the patient have prescription insurance?  Yes Type of insurance plan: [employer]  Assessment: 1. Chronic systolic CHF (EF 19% in 2017, repeat pending). NYHA class II symptoms. - PTA furosemide on hold - Continue carvedilol 6.25 mg BID - Consider starting low dose ARB if BP allows - BP too soft at this time  - Consider starting spironolactone if BP and SCr allows - Consider starting SGLT2i prior to discharge (neutral BP effects)   Plan: 1) Medication changes recommended at this time: - Continue current regimen - BP too soft to add ARB. May be able to add SGLT2i soon  2) Patient assistance: - Noted history of illiteracy and admitted following a fall at home.  - [facility] will send ambulatory referral to [facility] to hopefully enroll in paramedicine program following discharge.  Marcelline Deist copay $140/month - if copay is unaffordable, can enroll in patient assistance through manufacturer - 12/22 update: new plan for SNF - updated social work   Sharen Hones, PharmD, BCPS Heart Failure Engineer, building services Phone [phone]

## 2020-05-05 NOTE — Progress Notes (Signed)
Occupational Therapy Treatment Patient Details Name: Dennis [name] MRN: [id number] DOB: November 22, 1945 Today's Date: 05/05/2020    History of present illness Patient is a 74 y/o male who presents s/p falls x2, weakness, RLE swelling and SOB. Found to be hypoxic on arrival. Chest CT- acute PE in RLL. PMH includes depression, dementia, CVA (2013), PE, HTN, CHF, CAD, cardiomyopathy, illiteracy,   OT comments  Patient not willing to move today, but had obvious BM.  OT had patient roll side to side for hygiene and pad replacement.  Patient then brought to sit, stood, and side steps to head of bed, to lie higher in bed.  Patient continues to decline mobility due to R leg pain.  Patient states he is going home, he has an aide that "does everything for me."  OT can continue to follow patient, unclear what real progress he will make.    Follow Up Recommendations  SNF;Supervision/Assistance - 24 hour    Equipment Recommendations  3 in 1 bedside commode;Wheelchair (measurements OT)    Recommendations for Other Services      Precautions / Restrictions Precautions Precautions: Fall Restrictions Weight Bearing Restrictions: No       Mobility Bed Mobility Overal bed mobility: Needs Assistance Bed Mobility: Supine to Sit;Sit to Supine     Supine to sit: Mod assist Sit to supine: Mod assist      Transfers     Transfers: Sit to/from Stand Sit to Stand: Mod assist;From elevated surface              Balance   Sitting-balance support: Feet supported;Bilateral upper extremity supported Sitting balance-Leahy Scale: Fair     Standing balance support: During functional activity Standing balance-Leahy Scale: Poor                             ADL either performed or assessed with clinical judgement   ADL Overall ADL's : Needs assistance/impaired                         Toilet Transfer: Moderate assistance;Stand-pivot;RW   Toileting- Clothing Manipulation and  Hygiene: Total assistance                                                                                                   Pertinent Vitals/ Pain       Faces Pain Scale: Hurts whole lot Pain Location: RLE with mobility Pain Descriptors / Indicators: Sore;Aching;Guarding;Grimacing;Moaning Pain Intervention(s): Limited activity within patient's tolerance                                                          Frequency  Min 2X/week        Progress Toward Goals  OT Goals(current goals can now be found in the care plan section)     Acute Rehab OT Goals Patient  Stated Goal: to go home OT Goal Formulation: With patient Time For Goal Achievement: 05/18/20 Potential to Achieve Goals: Fair  Plan Discharge plan remains appropriate    Co-evaluation                 AM-PAC OT "6 Clicks" Daily Activity     Outcome Measure   Help from another person eating meals?: A Little Help from another person taking care of personal grooming?: A Little Help from another person toileting, which includes using toliet, bedpan, or urinal?: A Lot Help from another person bathing (including washing, rinsing, drying)?: A Lot Help from another person to put on and taking off regular upper body clothing?: A Lot Help from another person to put on and taking off regular lower body clothing?: A Lot 6 Click Score: 14    End of Session Equipment Utilized During Treatment: Gait belt;Rolling walker  OT Visit Diagnosis: Unsteadiness on feet (R26.81);Other abnormalities of gait and mobility (R26.89);Muscle weakness (generalized) (M62.81);Pain;Other symptoms and signs involving cognitive function;History of falling (Z91.81) Pain - part of body: Leg   Activity Tolerance Patient limited by pain   Patient Left with call bell/phone within reach;in bed;with bed alarm set   Nurse Communication Mobility status        Time:  2536-6440 OT Time Calculation (min): 13 min  Charges: OT General Charges $OT Visit: 1 Visit OT Treatments $Self Care/Home Management : 8-22 mins  05/05/2020  Rich, OTR/L  Acute Rehabilitation Services  Office:  [phone]    [name] 05/05/2020, 2:43 PM

## 2020-05-06 DIAGNOSIS — I129 Hypertensive chronic kidney disease with stage 1 through stage 4 chronic kidney disease, or unspecified chronic kidney disease: Secondary | ICD-10-CM | POA: Diagnosis not present

## 2020-05-06 DIAGNOSIS — R195 Other fecal abnormalities: Secondary | ICD-10-CM | POA: Diagnosis not present

## 2020-05-06 DIAGNOSIS — Z8673 Personal history of transient ischemic attack (TIA), and cerebral infarction without residual deficits: Secondary | ICD-10-CM | POA: Diagnosis not present

## 2020-05-06 DIAGNOSIS — Z8616 Personal history of COVID-19: Secondary | ICD-10-CM | POA: Diagnosis not present

## 2020-05-06 DIAGNOSIS — M549 Dorsalgia, unspecified: Secondary | ICD-10-CM | POA: Diagnosis not present

## 2020-05-06 DIAGNOSIS — R12 Heartburn: Secondary | ICD-10-CM | POA: Diagnosis not present

## 2020-05-06 DIAGNOSIS — N5089 Other specified disorders of the male genital organs: Secondary | ICD-10-CM | POA: Diagnosis not present

## 2020-05-06 DIAGNOSIS — D6859 Other primary thrombophilia: Secondary | ICD-10-CM | POA: Diagnosis not present

## 2020-05-06 DIAGNOSIS — I110 Hypertensive heart disease with heart failure: Secondary | ICD-10-CM | POA: Diagnosis not present

## 2020-05-06 DIAGNOSIS — U071 COVID-19: Secondary | ICD-10-CM | POA: Diagnosis not present

## 2020-05-06 DIAGNOSIS — M2550 Pain in unspecified joint: Secondary | ICD-10-CM | POA: Diagnosis not present

## 2020-05-06 DIAGNOSIS — N5082 Scrotal pain: Secondary | ICD-10-CM | POA: Diagnosis not present

## 2020-05-06 DIAGNOSIS — I2699 Other pulmonary embolism without acute cor pulmonale: Secondary | ICD-10-CM | POA: Diagnosis not present

## 2020-05-06 DIAGNOSIS — N503 Cyst of epididymis: Secondary | ICD-10-CM | POA: Diagnosis not present

## 2020-05-06 DIAGNOSIS — R7309 Other abnormal glucose: Secondary | ICD-10-CM | POA: Diagnosis not present

## 2020-05-06 DIAGNOSIS — N39498 Other specified urinary incontinence: Secondary | ICD-10-CM | POA: Diagnosis not present

## 2020-05-06 DIAGNOSIS — I5022 Chronic systolic (congestive) heart failure: Secondary | ICD-10-CM | POA: Diagnosis not present

## 2020-05-06 DIAGNOSIS — E7849 Other hyperlipidemia: Secondary | ICD-10-CM | POA: Diagnosis not present

## 2020-05-06 DIAGNOSIS — L720 Epidermal cyst: Secondary | ICD-10-CM | POA: Diagnosis not present

## 2020-05-06 DIAGNOSIS — R262 Difficulty in walking, not elsewhere classified: Secondary | ICD-10-CM | POA: Diagnosis not present

## 2020-05-06 DIAGNOSIS — M76891 Other specified enthesopathies of right lower limb, excluding foot: Secondary | ICD-10-CM | POA: Diagnosis not present

## 2020-05-06 DIAGNOSIS — E440 Moderate protein-calorie malnutrition: Secondary | ICD-10-CM | POA: Diagnosis not present

## 2020-05-06 DIAGNOSIS — Z7401 Bed confinement status: Secondary | ICD-10-CM | POA: Diagnosis not present

## 2020-05-06 DIAGNOSIS — M25559 Pain in unspecified hip: Secondary | ICD-10-CM | POA: Diagnosis not present

## 2020-05-06 DIAGNOSIS — R0989 Other specified symptoms and signs involving the circulatory and respiratory systems: Secondary | ICD-10-CM | POA: Diagnosis not present

## 2020-05-06 DIAGNOSIS — Z9181 History of falling: Secondary | ICD-10-CM | POA: Diagnosis not present

## 2020-05-06 DIAGNOSIS — D649 Anemia, unspecified: Secondary | ICD-10-CM | POA: Diagnosis not present

## 2020-05-06 DIAGNOSIS — M5459 Other low back pain: Secondary | ICD-10-CM | POA: Diagnosis not present

## 2020-05-06 DIAGNOSIS — M109 Gout, unspecified: Secondary | ICD-10-CM | POA: Diagnosis not present

## 2020-05-06 DIAGNOSIS — R4182 Altered mental status, unspecified: Secondary | ICD-10-CM | POA: Diagnosis not present

## 2020-05-06 LAB — CBC WITH DIFFERENTIAL/PLATELET
Abs Immature Granulocytes: 0.07 10*3/uL (ref 0.00–0.07)
Basophils Absolute: 0 10*3/uL (ref 0.0–0.1)
Basophils Relative: 0 %
Eosinophils Absolute: 0.4 10*3/uL (ref 0.0–0.5)
Eosinophils Relative: 4 %
HCT: 25.2 % — ABNORMAL LOW (ref 39.0–52.0)
Hemoglobin: 8.5 g/dL — ABNORMAL LOW (ref 13.0–17.0)
Immature Granulocytes: 1 %
Lymphocytes Relative: 16 %
Lymphs Abs: 1.5 10*3/uL (ref 0.7–4.0)
MCH: 31.8 pg (ref 26.0–34.0)
MCHC: 33.7 g/dL (ref 30.0–36.0)
MCV: 94.4 fL (ref 80.0–100.0)
Monocytes Absolute: 1.2 10*3/uL — ABNORMAL HIGH (ref 0.1–1.0)
Monocytes Relative: 13 %
Neutro Abs: 6.4 10*3/uL (ref 1.7–7.7)
Neutrophils Relative %: 66 %
Platelets: 138 10*3/uL — ABNORMAL LOW (ref 150–400)
RBC: 2.67 MIL/uL — ABNORMAL LOW (ref 4.22–5.81)
RDW: 13.4 % (ref 11.5–15.5)
WBC: 9.6 10*3/uL (ref 4.0–10.5)
nRBC: 0 % (ref 0.0–0.2)

## 2020-05-06 LAB — BASIC METABOLIC PANEL
Anion gap: 9 (ref 5–15)
BUN: 27 mg/dL — ABNORMAL HIGH (ref 8–23)
CO2: 27 mmol/L (ref 22–32)
Calcium: 8.9 mg/dL (ref 8.9–10.3)
Chloride: 103 mmol/L (ref 98–111)
Creatinine, Ser: 1.4 mg/dL — ABNORMAL HIGH (ref 0.61–1.24)
GFR, Estimated: 53 mL/min — ABNORMAL LOW (ref >60)
Glucose, Bld: 108 mg/dL — ABNORMAL HIGH (ref 70–99)
Potassium: 4.5 mmol/L (ref 3.5–5.1)
Sodium: 139 mmol/L (ref 135–145)

## 2020-05-06 MED ORDER — APIXABAN 5 MG PO TABS: 10.0000 mg | ORAL_TABLET | Freq: Two times a day (BID) | ORAL | 0 refills | Status: DC

## 2020-05-06 MED ORDER — APIXABAN 5 MG PO TABS: 5.0000 mg | ORAL_TABLET | Freq: Two times a day (BID) | ORAL | 0 refills | Status: AC

## 2020-05-06 NOTE — Plan of Care (Signed)

## 2020-05-06 NOTE — Discharge Summary (Signed)
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 SARS-CoV-2 by FDA under an Emergency Use Authorization (EUA). This EUA will remain  in effect (meaning this test can be used) for the duration of the COVID-19 declaration under Section 564(b)(1) of the Act, 21 U.S.C.section 360bbb-3(b)(1), unless the authorization is terminated  or revoked sooner.       Influenza A by PCR NEGATIVE NEGATIVE Final  Influenza B by PCR NEGATIVE NEGATIVE Final    Comment: (NOTE) The Xpert Xpress SARS-CoV-2/FLU/RSV plus assay is intended as an aid in the diagnosis of influenza from Nasopharyngeal swab specimens and should not be used as a sole basis for treatment. Nasal washings and aspirates are unacceptable for Xpert Xpress SARS-CoV-2/FLU/RSV testing.  Fact Sheet for Patients: BloggerCourse.com  Fact Sheet for Healthcare Providers: SeriousBroker.it  This test is not yet approved or cleared by the [place] FDA and has been authorized for detection and/or diagnosis of SARS-CoV-2 by FDA under an Emergency Use Authorization (EUA). This EUA will remain in effect (meaning this test can be used) for the duration of the COVID-19 declaration under Section 564(b)(1) of the Act, 21 U.S.C. section 360bbb-3(b)(1), unless the authorization is terminated or revoked.  Performed at [facility], [address], [place], [place] [address]      Labs: BNP (last 3 results) No results for input(s): BNP in the last 8760 hours. Basic Metabolic Panel: Recent Labs  Lab 05/02/20 1050 05/03/20 0500 05/04/20 0059 05/05/20 0145 05/06/20 0206  NA 143 145 139 139 139  K 4.3 3.6 4.3 4.3 4.5  CL 107 109 106 103 103  CO2 25 28 28 28 27   GLUCOSE 159* 116* 120* 122* 108*  BUN 31* 24* 26* 29* 27*  CREATININE 1.41* 1.26* 1.32* 1.52* 1.40*  CALCIUM 9.4 9.0 9.2 8.9 8.9   Liver Function Tests: Recent Labs  Lab 05/02/20 1401 05/04/20 0059  AST 97* 49*  ALT 67* 44  ALKPHOS 57 49  BILITOT 1.3* 0.9  PROT 6.5 4.9*  ALBUMIN 3.2* 2.5*   No results for input(s): LIPASE, AMYLASE in the last 168 hours. No results for input(s): AMMONIA in the last 168 hours. CBC: Recent Labs  Lab 05/02/20 1050 05/03/20 0500 05/04/20 0059 05/06/20 0206  WBC 8.0 8.1 7.8 9.6  NEUTROABS  --   --   --  6.4  HGB 10.0* 9.3* 8.6* 8.5*  HCT 30.5* 28.0* 25.6* 25.2*  MCV 97.1 95.9 95.5 94.4  PLT 141* 122* 136* 138*   Cardiac Enzymes: Recent Labs  Lab 05/02/20 1401  CKTOTAL 2,388*   BNP: Invalid input(s): POCBNP CBG: Recent Labs  Lab 05/02/20 1405  GLUCAP 124*   D-Dimer No results for input(s): DDIMER in the last 72 hours. Hgb A1c No results for input(s): HGBA1C in the last 72 hours. Lipid Profile No results for input(s): CHOL, HDL, LDLCALC, TRIG, CHOLHDL, LDLDIRECT in the last 72 hours. Thyroid function studies No results for input(s): TSH, T4TOTAL, T3FREE, THYROIDAB in the last 72 hours.  Invalid  input(s): FREET3 Anemia work up No results for input(s): VITAMINB12, FOLATE, FERRITIN, TIBC, IRON, RETICCTPCT in the last 72 hours. Urinalysis    Component Value Date/Time   COLORURINE YELLOW 05/02/2020 1603   APPEARANCEUR CLEAR 05/02/2020 1603   LABSPEC 1.015 05/02/2020 1603   PHURINE 5.0 05/02/2020 1603   GLUCOSEU NEGATIVE 05/02/2020 1603   HGBUR NEGATIVE 05/02/2020 1603   BILIRUBINUR NEGATIVE 05/02/2020 1603   KETONESUR NEGATIVE 05/02/2020 1603   PROTEINUR NEGATIVE 05/02/2020 1603   NITRITE NEGATIVE 05/02/2020 1603   LEUKOCYTESUR NEGATIVE 05/02/2020 1603   Sepsis Labs Invalid input(s): PROCALCITONIN,  WBC,  LACTICIDVEN Microbiology Recent Results (from the past 240 hour(s))  Resp Panel by RT-PCR (Flu A&B, Covid) Nasopharyngeal Swab     Status: None   Collection Time: 05/02/20  2:01 PM   Specimen: Nasopharyngeal Swab; Nasopharyngeal(NP) swabs in vial transport medium  Result Value Ref Range  Status   SARS Coronavirus 2 by RT PCR NEGATIVE NEGATIVE Final    Comment: (NOTE) SARS-CoV-2 target nucleic acids are NOT DETECTED.  The SARS-CoV-2 RNA is generally detectable in upper respiratory specimens during the acute phase of infection. The lowest concentration of SARS-CoV-2 viral copies this assay can detect is 138 copies/mL. A negative result does not preclude SARS-Cov-2 infection and should not be used as the sole basis for treatment or other patient management decisions. A negative result may occur with  improper specimen collection/handling, submission of specimen other than nasopharyngeal swab, presence of viral mutation(s) within the areas targeted by this assay, and inadequate number of viral copies(<138 copies/mL). A negative result must be combined with clinical observations, patient history, and epidemiological information. The expected result is Negative.  Fact Sheet for Patients:  BloggerCourse.com  Fact Sheet for Healthcare Providers:   SeriousBroker.it  This test is no t yet approved or cleared by the [place] FDA and  has been authorized for detection and/or diagnosis of SARS-CoV-2 by FDA under an Emergency Use Authorization (EUA). This EUA will remain  in effect (meaning this test can be used) for the duration of the COVID-19 declaration under Section 564(b)(1) of the Act, 21 U.S.C.section 360bbb-3(b)(1), unless the authorization is terminated  or revoked sooner.       Influenza A by PCR NEGATIVE NEGATIVE Final   Influenza B by PCR NEGATIVE NEGATIVE Final    Comment: (NOTE) The Xpert Xpress SARS-CoV-2/FLU/RSV plus assay is intended as an aid in the diagnosis of influenza from Nasopharyngeal swab specimens and should not be used as a sole basis for treatment. Nasal washings and aspirates are unacceptable for Xpert Xpress SARS-CoV-2/FLU/RSV testing.  Fact Sheet for Patients: BloggerCourse.com  Fact Sheet for Healthcare Providers: SeriousBroker.it  This test is not yet approved or cleared by the [place] FDA and has been authorized for detection and/or diagnosis of SARS-CoV-2 by FDA under an Emergency Use Authorization (EUA). This EUA will remain in effect (meaning this test can be used) for the duration of the COVID-19 declaration under Section 564(b)(1) of the Act, 21 U.S.C. section 360bbb-3(b)(1), unless the authorization is terminated or revoked.  Performed at [facility], [address] [place], [place] [address]   Resp Panel by RT-PCR (Flu A&B, Covid) Nasopharyngeal Swab     Status: None   Collection Time: 05/05/20  2:00 PM   Specimen: Nasopharyngeal Swab; Nasopharyngeal(NP) swabs in vial transport medium  Result Value Ref Range Status   SARS Coronavirus 2 by RT PCR NEGATIVE NEGATIVE Final    Comment: (NOTE) SARS-CoV-2 target nucleic acids are NOT DETECTED.  The SARS-CoV-2 RNA is generally detectable in  upper respiratory specimens during the acute phase of infection. The lowest concentration of SARS-CoV-2 viral copies this assay can detect is 138 copies/mL. A negative result does not preclude SARS-Cov-2 infection and should not be used as the sole basis for treatment or other patient management decisions. A negative result may occur with  improper specimen collection/handling, submission of specimen other than nasopharyngeal swab, presence of viral mutation(s) within the areas targeted by this assay, and inadequate number of viral copies(<138 copies/mL). A negative result must be combined with clinical observations, patient history, and epidemiological information. The expected result is Negative.  Fact Sheet for Patients:  BloggerCourse.com  Fact Sheet for Healthcare Providers:  SeriousBroker.it  This test is no t yet approved or cleared by the [place] FDA and  has been authorized for detection and/or diagnosis of  SARS-CoV-2 by FDA under an Emergency Use Authorization (EUA). This EUA will remain  in effect (meaning this test can be used) for the duration of the COVID-19 declaration under Section 564(b)(1) of the Act, 21 U.S.C.section 360bbb-3(b)(1), unless the authorization is terminated  or revoked sooner.       Influenza A by PCR NEGATIVE NEGATIVE Final   Influenza B by PCR NEGATIVE NEGATIVE Final    Comment: (NOTE) The Xpert Xpress SARS-CoV-2/FLU/RSV plus assay is intended as an aid in the diagnosis of influenza from Nasopharyngeal swab specimens and should not be used as a sole basis for treatment. Nasal washings and aspirates are unacceptable for Xpert Xpress SARS-CoV-2/FLU/RSV testing.  Fact Sheet for Patients: BloggerCourse.com  Fact Sheet for Healthcare Providers: SeriousBroker.it  This test is not yet approved or cleared by the [place] FDA and has been  authorized for detection and/or diagnosis of SARS-CoV-2 by FDA under an Emergency Use Authorization (EUA). This EUA will remain in effect (meaning this test can be used) for the duration of the COVID-19 declaration under Section 564(b)(1) of the Act, 21 U.S.C. section 360bbb-3(b)(1), unless the authorization is terminated or revoked.  Performed at [facility], [address] [place], [place] [address]      Time coordinating discharge: Over 30 minutes  SIGNED:   Hughie Closs, MD  [facility] 05/06/2020, 9:46 AM  If 7PM-7AM, please contact night-coverage [email or web address]

## 2020-05-06 NOTE — TOC Transition Note (Signed)
Transition of Care [facility]) - CM/SW Discharge Note   Patient Details  Name: Dennis [name] MRN: [id number] Date of Birth: [birth year]  [facility]) CM/SW Contact:  Eduard Roux, LCSWA Phone Number: 05/06/2020, 12:33 PM   Clinical Narrative:     Patient will DC to: [facility]  DC Date: 05/06/2020 Family Notified: DSS Gaurdian-Dendra(left voice message) Transport [id number]   Per MD patient is ready for discharge. RN, patient, and facility notified of DC. Discharge Summary sent to facility. RN given number for report([phone]). Ambulance transport requested for patient.   Clinical Social Worker signing off.  Antony Blackbird, MSW, LCSW Clinical Social Worker    Final next level of care: Skilled Nursing Facility Barriers to Discharge: Barriers Resolved   Patient Goals and CMS Choice   CMS Medicare.gov Compare Post Acute Care list provided to:: Patient Represenative (must comment) (Dendra caseworker) Choice offered to / list presented to : NA (Dendra caseworker)  Discharge Placement              Patient chooses bed at: [facility] Patient to be transferred to facility by: [facility] Name of family member notified: Dendra- DSS Guradian-left voice message Patient and family notified of of transfer: 05/06/20  Discharge Plan and Services                                     Social Determinants of Health (SDOH) Interventions     Readmission Risk Interventions No flowsheet data found.

## 2020-05-06 NOTE — Care Management Important Message (Signed)
Important Message  Patient Details  Name: Dennis [name] MRN: [id number] Date of Birth: Jul 06, 1945   Medicare Important Message Given:  Yes     Renie Ora 05/06/2020, 11:55 AM

## 2020-05-10 ENCOUNTER — Telehealth: Payer: Self-pay | Admitting: *Deleted

## 2020-05-10 ENCOUNTER — Other Ambulatory Visit: Payer: Self-pay

## 2020-05-10 DIAGNOSIS — D6859 Other primary thrombophilia: Secondary | ICD-10-CM | POA: Diagnosis not present

## 2020-05-10 DIAGNOSIS — I2699 Other pulmonary embolism without acute cor pulmonale: Secondary | ICD-10-CM | POA: Diagnosis not present

## 2020-05-10 DIAGNOSIS — R262 Difficulty in walking, not elsewhere classified: Secondary | ICD-10-CM | POA: Diagnosis not present

## 2020-05-10 DIAGNOSIS — M76891 Other specified enthesopathies of right lower limb, excluding foot: Secondary | ICD-10-CM | POA: Diagnosis not present

## 2020-05-10 NOTE — Telephone Encounter (Signed)
Patient was admitted to [facility] center, not [facility].

## 2020-05-10 NOTE — Telephone Encounter (Signed)
Called patient on 05/10/2020 , 11:57 AM in an attempt to reach the patient for a hospital follow up. Spoke with the patient's caregiver. The patient was discharged to [facility].  Admit date: 05/02/20 Discharge: 05/06/20   He does not have any questions or concerns about medications from the hospital admission. The patient's medications were reviewed over the phone, they were counseled to bring in all current medications to the hospital follow up visit.   I advised the patient to call if any questions or concerns arise about the hospital admission or medications    Home health was not started in the hospital.  All questions were answered and a follow up appointment was made. Advised caregiver to call and schedule a follow up  appointment when the patient is released to come home from rehab.  Prior to Admission medications   Medication Sig Start Date End Date Taking? Authorizing Provider  apixaban (ELIQUIS) 5 MG TABS tablet Take 2 tablets (10 mg total) by mouth 2 (two) times daily for 3 days. 05/06/20 05/09/20  Hughie Closs, MD  apixaban (ELIQUIS) 5 MG TABS tablet Take 1 tablet (5 mg total) by mouth 2 (two) times daily. 05/10/20 06/09/20  Hughie Closs, MD  carvedilol (COREG) 25 MG tablet Take 1/2 tablet 2 x /day with meal for BP Patient taking differently: Take 25 mg by mouth 2 (two) times daily with a meal. 05/29/19   Lucky Cowboy, MD  Cholecalciferol (VITAMIN D3) 50 MCG (2000 UT) capsule Take 2 capsules Daily for Vitamin  D Deficiency Patient taking differently: Take 2,000 Units by mouth 2 (two) times daily. 05/29/19   Lucky Cowboy, MD  donepezil (ARICEPT) 5 MG tablet Take 1 tablet Daily for Memory Patient taking differently: Take 5 mg by mouth daily. 08/21/19   Lucky Cowboy, MD  furosemide (LASIX) 20 MG tablet Take 1 tab (20 mg) daily for BP and fluid. 01/01/20   Judd Gaudier, NP  pravastatin (PRAVACHOL) 20 MG tablet TAKE 1 TABLET AT BEDTIME FOR CHOLESTEROL Patient  taking differently: Take 20 mg by mouth daily. 03/09/20   Lucky Cowboy, MD  sertraline (ZOLOFT) 50 MG tablet Take 50 mg by mouth daily. 03/09/20   [provider]  tamsulosin (FLOMAX) 0.4 MG CAPS capsule TAKE 1 CAPSULE (0.4 MG TOTAL) AT BEDTIME FOR PROSTATE Patient taking differently: Take 0.4 mg by mouth at bedtime. 03/18/20   Lucky Cowboy, MD

## 2020-05-10 NOTE — Patient Outreach (Signed)
[facility]) Care Management  05/10/2020  JAIME GRIZZELL [birth year] [id number]     Transition of Care Referral  Referral Date:05/10/2020 Referral Source: [employer] Discharge Report Date of Discharge: 05/06/2020 Facility: [facility] Insurance: [facility]    Referral received. Upon chart review noted that patient discharged to SNF.     Plan: RN CM will close case at this time.    Antionette Fairy, RN,BSN,CCM [facility] Care Management Telephonic Care Management Coordinator Direct Phone: [phone] Toll Free: [phone] Fax: [phone]

## 2020-05-13 ENCOUNTER — Encounter: Payer: Self-pay | Admitting: General Practice

## 2020-05-16 ENCOUNTER — Other Ambulatory Visit: Payer: Self-pay | Admitting: Internal Medicine

## 2020-05-17 DIAGNOSIS — R0989 Other specified symptoms and signs involving the circulatory and respiratory systems: Secondary | ICD-10-CM | POA: Diagnosis not present

## 2020-05-17 DIAGNOSIS — R195 Other fecal abnormalities: Secondary | ICD-10-CM | POA: Diagnosis not present

## 2020-05-17 DIAGNOSIS — D649 Anemia, unspecified: Secondary | ICD-10-CM | POA: Diagnosis not present

## 2020-05-17 DIAGNOSIS — M25559 Pain in unspecified hip: Secondary | ICD-10-CM | POA: Diagnosis not present

## 2020-05-21 DIAGNOSIS — I2699 Other pulmonary embolism without acute cor pulmonale: Secondary | ICD-10-CM | POA: Diagnosis not present

## 2020-05-21 DIAGNOSIS — M25559 Pain in unspecified hip: Secondary | ICD-10-CM | POA: Diagnosis not present

## 2020-05-21 DIAGNOSIS — R12 Heartburn: Secondary | ICD-10-CM | POA: Diagnosis not present

## 2020-05-21 DIAGNOSIS — R262 Difficulty in walking, not elsewhere classified: Secondary | ICD-10-CM | POA: Diagnosis not present

## 2020-06-07 DIAGNOSIS — N5089 Other specified disorders of the male genital organs: Secondary | ICD-10-CM | POA: Diagnosis not present

## 2020-06-07 DIAGNOSIS — I129 Hypertensive chronic kidney disease with stage 1 through stage 4 chronic kidney disease, or unspecified chronic kidney disease: Secondary | ICD-10-CM | POA: Diagnosis not present

## 2020-06-11 DIAGNOSIS — M549 Dorsalgia, unspecified: Secondary | ICD-10-CM | POA: Diagnosis not present

## 2020-06-11 DIAGNOSIS — N503 Cyst of epididymis: Secondary | ICD-10-CM | POA: Diagnosis not present

## 2020-06-11 DIAGNOSIS — U071 COVID-19: Secondary | ICD-10-CM | POA: Diagnosis not present

## 2020-06-11 DIAGNOSIS — R262 Difficulty in walking, not elsewhere classified: Secondary | ICD-10-CM | POA: Diagnosis not present

## 2020-06-16 DIAGNOSIS — N5089 Other specified disorders of the male genital organs: Secondary | ICD-10-CM | POA: Diagnosis not present

## 2020-06-16 DIAGNOSIS — L720 Epidermal cyst: Secondary | ICD-10-CM | POA: Diagnosis not present

## 2020-06-23 ENCOUNTER — Ambulatory Visit: Payer: Medicare HMO | Admitting: Adult Health Nurse Practitioner

## 2020-06-25 DIAGNOSIS — M5459 Other low back pain: Secondary | ICD-10-CM | POA: Diagnosis not present

## 2020-06-25 DIAGNOSIS — N39498 Other specified urinary incontinence: Secondary | ICD-10-CM | POA: Diagnosis not present

## 2020-06-25 DIAGNOSIS — N503 Cyst of epididymis: Secondary | ICD-10-CM | POA: Diagnosis not present

## 2020-06-29 ENCOUNTER — Other Ambulatory Visit: Payer: Self-pay

## 2020-06-29 NOTE — Patient Outreach (Signed)
[facility]) Care Management  06/29/2020  Dennis [name] 26-Nov-1945 [id number]     Transition of Care Referral  Referral Date: 06/29/2020 Referral Source: [facility] Discharge Report Date of Discharge: 06/26/2020 Facility: [facility] Insurance: [facility]   Referral received. Per Patient Ping platform patient showing as not discharged,     Plan: RN CM will close referral at this time.   Antionette Fairy, RN,BSN,CCM [facility] Care Management Telephonic Care Management Coordinator Direct Phone: [phone] Toll Free: [phone] Fax: [phone]

## 2020-07-07 DIAGNOSIS — F0390 Unspecified dementia without behavioral disturbance: Secondary | ICD-10-CM | POA: Diagnosis not present

## 2020-07-07 DIAGNOSIS — I2510 Atherosclerotic heart disease of native coronary artery without angina pectoris: Secondary | ICD-10-CM | POA: Diagnosis not present

## 2020-07-07 DIAGNOSIS — I5022 Chronic systolic (congestive) heart failure: Secondary | ICD-10-CM | POA: Diagnosis not present

## 2020-07-07 DIAGNOSIS — I714 Abdominal aortic aneurysm, without rupture: Secondary | ICD-10-CM | POA: Diagnosis not present

## 2020-07-07 DIAGNOSIS — R262 Difficulty in walking, not elsewhere classified: Secondary | ICD-10-CM | POA: Diagnosis not present

## 2020-07-07 DIAGNOSIS — M1388 Other specified arthritis, other site: Secondary | ICD-10-CM | POA: Diagnosis not present

## 2020-07-07 DIAGNOSIS — I150 Renovascular hypertension: Secondary | ICD-10-CM | POA: Diagnosis not present

## 2020-07-07 DIAGNOSIS — M6281 Muscle weakness (generalized): Secondary | ICD-10-CM | POA: Diagnosis not present

## 2020-07-07 DIAGNOSIS — E440 Moderate protein-calorie malnutrition: Secondary | ICD-10-CM | POA: Diagnosis not present

## 2020-07-07 DIAGNOSIS — I5041 Acute combined systolic (congestive) and diastolic (congestive) heart failure: Secondary | ICD-10-CM | POA: Diagnosis not present

## 2020-07-07 DIAGNOSIS — I2699 Other pulmonary embolism without acute cor pulmonale: Secondary | ICD-10-CM | POA: Diagnosis not present

## 2020-07-07 DIAGNOSIS — D6859 Other primary thrombophilia: Secondary | ICD-10-CM | POA: Diagnosis not present

## 2020-07-08 DIAGNOSIS — M6281 Muscle weakness (generalized): Secondary | ICD-10-CM | POA: Diagnosis not present

## 2020-07-08 DIAGNOSIS — I2699 Other pulmonary embolism without acute cor pulmonale: Secondary | ICD-10-CM | POA: Diagnosis not present

## 2020-07-08 DIAGNOSIS — I5022 Chronic systolic (congestive) heart failure: Secondary | ICD-10-CM | POA: Diagnosis not present

## 2020-07-09 DIAGNOSIS — M6281 Muscle weakness (generalized): Secondary | ICD-10-CM | POA: Diagnosis not present

## 2020-07-09 DIAGNOSIS — I2699 Other pulmonary embolism without acute cor pulmonale: Secondary | ICD-10-CM | POA: Diagnosis not present

## 2020-07-09 DIAGNOSIS — I5022 Chronic systolic (congestive) heart failure: Secondary | ICD-10-CM | POA: Diagnosis not present

## 2020-07-12 DIAGNOSIS — I2699 Other pulmonary embolism without acute cor pulmonale: Secondary | ICD-10-CM | POA: Diagnosis not present

## 2020-07-12 DIAGNOSIS — M6281 Muscle weakness (generalized): Secondary | ICD-10-CM | POA: Diagnosis not present

## 2020-07-12 DIAGNOSIS — I5022 Chronic systolic (congestive) heart failure: Secondary | ICD-10-CM | POA: Diagnosis not present

## 2020-07-13 DIAGNOSIS — M6281 Muscle weakness (generalized): Secondary | ICD-10-CM | POA: Diagnosis not present

## 2020-07-13 DIAGNOSIS — I5022 Chronic systolic (congestive) heart failure: Secondary | ICD-10-CM | POA: Diagnosis not present

## 2020-07-13 DIAGNOSIS — I2699 Other pulmonary embolism without acute cor pulmonale: Secondary | ICD-10-CM | POA: Diagnosis not present

## 2020-07-14 DIAGNOSIS — I2699 Other pulmonary embolism without acute cor pulmonale: Secondary | ICD-10-CM | POA: Diagnosis not present

## 2020-07-14 DIAGNOSIS — M6281 Muscle weakness (generalized): Secondary | ICD-10-CM | POA: Diagnosis not present

## 2020-07-14 DIAGNOSIS — I5022 Chronic systolic (congestive) heart failure: Secondary | ICD-10-CM | POA: Diagnosis not present

## 2020-07-15 DIAGNOSIS — I5022 Chronic systolic (congestive) heart failure: Secondary | ICD-10-CM | POA: Diagnosis not present

## 2020-07-15 DIAGNOSIS — I2699 Other pulmonary embolism without acute cor pulmonale: Secondary | ICD-10-CM | POA: Diagnosis not present

## 2020-07-15 DIAGNOSIS — M6281 Muscle weakness (generalized): Secondary | ICD-10-CM | POA: Diagnosis not present

## 2020-07-16 DIAGNOSIS — I2699 Other pulmonary embolism without acute cor pulmonale: Secondary | ICD-10-CM | POA: Diagnosis not present

## 2020-07-16 DIAGNOSIS — M6281 Muscle weakness (generalized): Secondary | ICD-10-CM | POA: Diagnosis not present

## 2020-07-16 DIAGNOSIS — I5022 Chronic systolic (congestive) heart failure: Secondary | ICD-10-CM | POA: Diagnosis not present

## 2020-07-19 DIAGNOSIS — M5136 Other intervertebral disc degeneration, lumbar region: Secondary | ICD-10-CM | POA: Diagnosis not present

## 2020-07-19 DIAGNOSIS — M549 Dorsalgia, unspecified: Secondary | ICD-10-CM | POA: Diagnosis not present

## 2020-07-19 DIAGNOSIS — M545 Low back pain, unspecified: Secondary | ICD-10-CM | POA: Diagnosis not present

## 2020-07-19 DIAGNOSIS — M6281 Muscle weakness (generalized): Secondary | ICD-10-CM | POA: Diagnosis not present

## 2020-07-19 DIAGNOSIS — I5022 Chronic systolic (congestive) heart failure: Secondary | ICD-10-CM | POA: Diagnosis not present

## 2020-07-19 DIAGNOSIS — I2699 Other pulmonary embolism without acute cor pulmonale: Secondary | ICD-10-CM | POA: Diagnosis not present

## 2020-07-21 DIAGNOSIS — I2699 Other pulmonary embolism without acute cor pulmonale: Secondary | ICD-10-CM | POA: Diagnosis not present

## 2020-07-21 DIAGNOSIS — R197 Diarrhea, unspecified: Secondary | ICD-10-CM | POA: Diagnosis not present

## 2020-07-21 DIAGNOSIS — M5136 Other intervertebral disc degeneration, lumbar region: Secondary | ICD-10-CM | POA: Diagnosis not present

## 2020-07-21 DIAGNOSIS — M419 Scoliosis, unspecified: Secondary | ICD-10-CM | POA: Diagnosis not present

## 2020-07-21 DIAGNOSIS — I5022 Chronic systolic (congestive) heart failure: Secondary | ICD-10-CM | POA: Diagnosis not present

## 2020-07-21 DIAGNOSIS — M6281 Muscle weakness (generalized): Secondary | ICD-10-CM | POA: Diagnosis not present

## 2020-07-22 DIAGNOSIS — I5022 Chronic systolic (congestive) heart failure: Secondary | ICD-10-CM | POA: Diagnosis not present

## 2020-07-22 DIAGNOSIS — I2699 Other pulmonary embolism without acute cor pulmonale: Secondary | ICD-10-CM | POA: Diagnosis not present

## 2020-07-22 DIAGNOSIS — M6281 Muscle weakness (generalized): Secondary | ICD-10-CM | POA: Diagnosis not present

## 2020-07-23 DIAGNOSIS — I2699 Other pulmonary embolism without acute cor pulmonale: Secondary | ICD-10-CM | POA: Diagnosis not present

## 2020-07-23 DIAGNOSIS — I10 Essential (primary) hypertension: Secondary | ICD-10-CM | POA: Diagnosis not present

## 2020-07-23 DIAGNOSIS — M6281 Muscle weakness (generalized): Secondary | ICD-10-CM | POA: Diagnosis not present

## 2020-07-23 DIAGNOSIS — I5022 Chronic systolic (congestive) heart failure: Secondary | ICD-10-CM | POA: Diagnosis not present

## 2020-07-26 DIAGNOSIS — I5022 Chronic systolic (congestive) heart failure: Secondary | ICD-10-CM | POA: Diagnosis not present

## 2020-07-26 DIAGNOSIS — I2699 Other pulmonary embolism without acute cor pulmonale: Secondary | ICD-10-CM | POA: Diagnosis not present

## 2020-07-26 DIAGNOSIS — M6281 Muscle weakness (generalized): Secondary | ICD-10-CM | POA: Diagnosis not present

## 2020-07-27 DIAGNOSIS — I2699 Other pulmonary embolism without acute cor pulmonale: Secondary | ICD-10-CM | POA: Diagnosis not present

## 2020-07-27 DIAGNOSIS — I5022 Chronic systolic (congestive) heart failure: Secondary | ICD-10-CM | POA: Diagnosis not present

## 2020-07-27 DIAGNOSIS — M6281 Muscle weakness (generalized): Secondary | ICD-10-CM | POA: Diagnosis not present

## 2020-07-28 DIAGNOSIS — I5022 Chronic systolic (congestive) heart failure: Secondary | ICD-10-CM | POA: Diagnosis not present

## 2020-07-28 DIAGNOSIS — M6281 Muscle weakness (generalized): Secondary | ICD-10-CM | POA: Diagnosis not present

## 2020-07-28 DIAGNOSIS — I2699 Other pulmonary embolism without acute cor pulmonale: Secondary | ICD-10-CM | POA: Diagnosis not present

## 2020-07-29 DIAGNOSIS — I5022 Chronic systolic (congestive) heart failure: Secondary | ICD-10-CM | POA: Diagnosis not present

## 2020-07-29 DIAGNOSIS — M6281 Muscle weakness (generalized): Secondary | ICD-10-CM | POA: Diagnosis not present

## 2020-07-29 DIAGNOSIS — I2699 Other pulmonary embolism without acute cor pulmonale: Secondary | ICD-10-CM | POA: Diagnosis not present

## 2020-07-30 DIAGNOSIS — M6281 Muscle weakness (generalized): Secondary | ICD-10-CM | POA: Diagnosis not present

## 2020-07-30 DIAGNOSIS — I5022 Chronic systolic (congestive) heart failure: Secondary | ICD-10-CM | POA: Diagnosis not present

## 2020-07-30 DIAGNOSIS — I2699 Other pulmonary embolism without acute cor pulmonale: Secondary | ICD-10-CM | POA: Diagnosis not present

## 2020-08-02 DIAGNOSIS — M419 Scoliosis, unspecified: Secondary | ICD-10-CM | POA: Diagnosis not present

## 2020-08-02 DIAGNOSIS — M6281 Muscle weakness (generalized): Secondary | ICD-10-CM | POA: Diagnosis not present

## 2020-08-02 DIAGNOSIS — I5041 Acute combined systolic (congestive) and diastolic (congestive) heart failure: Secondary | ICD-10-CM | POA: Diagnosis not present

## 2020-08-02 DIAGNOSIS — R262 Difficulty in walking, not elsewhere classified: Secondary | ICD-10-CM | POA: Diagnosis not present

## 2020-08-02 DIAGNOSIS — I2699 Other pulmonary embolism without acute cor pulmonale: Secondary | ICD-10-CM | POA: Diagnosis not present

## 2020-08-02 DIAGNOSIS — F0150 Vascular dementia without behavioral disturbance: Secondary | ICD-10-CM | POA: Diagnosis not present

## 2020-08-02 DIAGNOSIS — I2510 Atherosclerotic heart disease of native coronary artery without angina pectoris: Secondary | ICD-10-CM | POA: Diagnosis not present

## 2020-08-02 DIAGNOSIS — M5136 Other intervertebral disc degeneration, lumbar region: Secondary | ICD-10-CM | POA: Diagnosis not present

## 2020-08-02 DIAGNOSIS — I5022 Chronic systolic (congestive) heart failure: Secondary | ICD-10-CM | POA: Diagnosis not present

## 2020-08-04 DIAGNOSIS — I2699 Other pulmonary embolism without acute cor pulmonale: Secondary | ICD-10-CM | POA: Diagnosis not present

## 2020-08-04 DIAGNOSIS — I5022 Chronic systolic (congestive) heart failure: Secondary | ICD-10-CM | POA: Diagnosis not present

## 2020-08-04 DIAGNOSIS — M6281 Muscle weakness (generalized): Secondary | ICD-10-CM | POA: Diagnosis not present

## 2020-08-05 DIAGNOSIS — I2699 Other pulmonary embolism without acute cor pulmonale: Secondary | ICD-10-CM | POA: Diagnosis not present

## 2020-08-05 DIAGNOSIS — I5022 Chronic systolic (congestive) heart failure: Secondary | ICD-10-CM | POA: Diagnosis not present

## 2020-08-05 DIAGNOSIS — M6281 Muscle weakness (generalized): Secondary | ICD-10-CM | POA: Diagnosis not present

## 2020-08-06 DIAGNOSIS — M6281 Muscle weakness (generalized): Secondary | ICD-10-CM | POA: Diagnosis not present

## 2020-08-06 DIAGNOSIS — I2699 Other pulmonary embolism without acute cor pulmonale: Secondary | ICD-10-CM | POA: Diagnosis not present

## 2020-08-06 DIAGNOSIS — I5022 Chronic systolic (congestive) heart failure: Secondary | ICD-10-CM | POA: Diagnosis not present

## 2020-08-07 DIAGNOSIS — I2699 Other pulmonary embolism without acute cor pulmonale: Secondary | ICD-10-CM | POA: Diagnosis not present

## 2020-08-07 DIAGNOSIS — M6281 Muscle weakness (generalized): Secondary | ICD-10-CM | POA: Diagnosis not present

## 2020-08-07 DIAGNOSIS — I5022 Chronic systolic (congestive) heart failure: Secondary | ICD-10-CM | POA: Diagnosis not present

## 2020-08-09 DIAGNOSIS — M5136 Other intervertebral disc degeneration, lumbar region: Secondary | ICD-10-CM | POA: Diagnosis not present

## 2020-08-09 DIAGNOSIS — I5022 Chronic systolic (congestive) heart failure: Secondary | ICD-10-CM | POA: Diagnosis not present

## 2020-08-09 DIAGNOSIS — I2699 Other pulmonary embolism without acute cor pulmonale: Secondary | ICD-10-CM | POA: Diagnosis not present

## 2020-08-09 DIAGNOSIS — M6281 Muscle weakness (generalized): Secondary | ICD-10-CM | POA: Diagnosis not present

## 2020-08-09 DIAGNOSIS — M419 Scoliosis, unspecified: Secondary | ICD-10-CM | POA: Diagnosis not present

## 2020-08-10 DIAGNOSIS — I5022 Chronic systolic (congestive) heart failure: Secondary | ICD-10-CM | POA: Diagnosis not present

## 2020-08-10 DIAGNOSIS — M6281 Muscle weakness (generalized): Secondary | ICD-10-CM | POA: Diagnosis not present

## 2020-08-10 DIAGNOSIS — I2699 Other pulmonary embolism without acute cor pulmonale: Secondary | ICD-10-CM | POA: Diagnosis not present

## 2020-08-11 DIAGNOSIS — I2699 Other pulmonary embolism without acute cor pulmonale: Secondary | ICD-10-CM | POA: Diagnosis not present

## 2020-08-11 DIAGNOSIS — M6281 Muscle weakness (generalized): Secondary | ICD-10-CM | POA: Diagnosis not present

## 2020-08-11 DIAGNOSIS — I5022 Chronic systolic (congestive) heart failure: Secondary | ICD-10-CM | POA: Diagnosis not present

## 2020-08-12 DIAGNOSIS — M6281 Muscle weakness (generalized): Secondary | ICD-10-CM | POA: Diagnosis not present

## 2020-08-12 DIAGNOSIS — I2699 Other pulmonary embolism without acute cor pulmonale: Secondary | ICD-10-CM | POA: Diagnosis not present

## 2020-08-12 DIAGNOSIS — I5022 Chronic systolic (congestive) heart failure: Secondary | ICD-10-CM | POA: Diagnosis not present

## 2020-08-13 DIAGNOSIS — I5022 Chronic systolic (congestive) heart failure: Secondary | ICD-10-CM | POA: Diagnosis not present

## 2020-08-13 DIAGNOSIS — I2699 Other pulmonary embolism without acute cor pulmonale: Secondary | ICD-10-CM | POA: Diagnosis not present

## 2020-08-13 DIAGNOSIS — M6281 Muscle weakness (generalized): Secondary | ICD-10-CM | POA: Diagnosis not present

## 2020-08-16 DIAGNOSIS — I5022 Chronic systolic (congestive) heart failure: Secondary | ICD-10-CM | POA: Diagnosis not present

## 2020-08-16 DIAGNOSIS — M6281 Muscle weakness (generalized): Secondary | ICD-10-CM | POA: Diagnosis not present

## 2020-08-16 DIAGNOSIS — I2699 Other pulmonary embolism without acute cor pulmonale: Secondary | ICD-10-CM | POA: Diagnosis not present

## 2020-08-17 DIAGNOSIS — M6281 Muscle weakness (generalized): Secondary | ICD-10-CM | POA: Diagnosis not present

## 2020-08-17 DIAGNOSIS — R4189 Other symptoms and signs involving cognitive functions and awareness: Secondary | ICD-10-CM | POA: Diagnosis not present

## 2020-08-17 DIAGNOSIS — I2699 Other pulmonary embolism without acute cor pulmonale: Secondary | ICD-10-CM | POA: Diagnosis not present

## 2020-08-17 DIAGNOSIS — M21612 Bunion of left foot: Secondary | ICD-10-CM | POA: Diagnosis not present

## 2020-08-17 DIAGNOSIS — I5022 Chronic systolic (congestive) heart failure: Secondary | ICD-10-CM | POA: Diagnosis not present

## 2020-08-18 DIAGNOSIS — M6281 Muscle weakness (generalized): Secondary | ICD-10-CM | POA: Diagnosis not present

## 2020-08-18 DIAGNOSIS — I2699 Other pulmonary embolism without acute cor pulmonale: Secondary | ICD-10-CM | POA: Diagnosis not present

## 2020-08-18 DIAGNOSIS — I5022 Chronic systolic (congestive) heart failure: Secondary | ICD-10-CM | POA: Diagnosis not present

## 2020-08-19 DIAGNOSIS — I5022 Chronic systolic (congestive) heart failure: Secondary | ICD-10-CM | POA: Diagnosis not present

## 2020-08-19 DIAGNOSIS — I2699 Other pulmonary embolism without acute cor pulmonale: Secondary | ICD-10-CM | POA: Diagnosis not present

## 2020-08-19 DIAGNOSIS — M6281 Muscle weakness (generalized): Secondary | ICD-10-CM | POA: Diagnosis not present

## 2020-08-20 DIAGNOSIS — I2699 Other pulmonary embolism without acute cor pulmonale: Secondary | ICD-10-CM | POA: Diagnosis not present

## 2020-08-20 DIAGNOSIS — M6281 Muscle weakness (generalized): Secondary | ICD-10-CM | POA: Diagnosis not present

## 2020-08-20 DIAGNOSIS — I5022 Chronic systolic (congestive) heart failure: Secondary | ICD-10-CM | POA: Diagnosis not present

## 2020-08-23 DIAGNOSIS — I2699 Other pulmonary embolism without acute cor pulmonale: Secondary | ICD-10-CM | POA: Diagnosis not present

## 2020-08-23 DIAGNOSIS — M6281 Muscle weakness (generalized): Secondary | ICD-10-CM | POA: Diagnosis not present

## 2020-08-23 DIAGNOSIS — I5022 Chronic systolic (congestive) heart failure: Secondary | ICD-10-CM | POA: Diagnosis not present

## 2020-08-24 DIAGNOSIS — M6281 Muscle weakness (generalized): Secondary | ICD-10-CM | POA: Diagnosis not present

## 2020-08-24 DIAGNOSIS — I5022 Chronic systolic (congestive) heart failure: Secondary | ICD-10-CM | POA: Diagnosis not present

## 2020-08-24 DIAGNOSIS — I2699 Other pulmonary embolism without acute cor pulmonale: Secondary | ICD-10-CM | POA: Diagnosis not present

## 2020-08-25 DIAGNOSIS — M62830 Muscle spasm of back: Secondary | ICD-10-CM | POA: Diagnosis not present

## 2020-08-25 DIAGNOSIS — M5136 Other intervertebral disc degeneration, lumbar region: Secondary | ICD-10-CM | POA: Diagnosis not present

## 2020-08-25 DIAGNOSIS — M6281 Muscle weakness (generalized): Secondary | ICD-10-CM | POA: Diagnosis not present

## 2020-08-25 DIAGNOSIS — I5022 Chronic systolic (congestive) heart failure: Secondary | ICD-10-CM | POA: Diagnosis not present

## 2020-08-25 DIAGNOSIS — I2699 Other pulmonary embolism without acute cor pulmonale: Secondary | ICD-10-CM | POA: Diagnosis not present

## 2020-08-26 DIAGNOSIS — M6281 Muscle weakness (generalized): Secondary | ICD-10-CM | POA: Diagnosis not present

## 2020-08-26 DIAGNOSIS — I5022 Chronic systolic (congestive) heart failure: Secondary | ICD-10-CM | POA: Diagnosis not present

## 2020-08-26 DIAGNOSIS — I2699 Other pulmonary embolism without acute cor pulmonale: Secondary | ICD-10-CM | POA: Diagnosis not present

## 2020-08-27 DIAGNOSIS — I2699 Other pulmonary embolism without acute cor pulmonale: Secondary | ICD-10-CM | POA: Diagnosis not present

## 2020-08-27 DIAGNOSIS — M6281 Muscle weakness (generalized): Secondary | ICD-10-CM | POA: Diagnosis not present

## 2020-08-27 DIAGNOSIS — I5022 Chronic systolic (congestive) heart failure: Secondary | ICD-10-CM | POA: Diagnosis not present

## 2020-08-30 DIAGNOSIS — I5022 Chronic systolic (congestive) heart failure: Secondary | ICD-10-CM | POA: Diagnosis not present

## 2020-08-30 DIAGNOSIS — I2699 Other pulmonary embolism without acute cor pulmonale: Secondary | ICD-10-CM | POA: Diagnosis not present

## 2020-08-30 DIAGNOSIS — M6281 Muscle weakness (generalized): Secondary | ICD-10-CM | POA: Diagnosis not present

## 2020-09-06 DIAGNOSIS — I10 Essential (primary) hypertension: Secondary | ICD-10-CM | POA: Diagnosis not present

## 2020-09-08 DIAGNOSIS — I5022 Chronic systolic (congestive) heart failure: Secondary | ICD-10-CM | POA: Diagnosis not present

## 2020-09-08 DIAGNOSIS — I499 Cardiac arrhythmia, unspecified: Secondary | ICD-10-CM | POA: Diagnosis not present

## 2020-09-08 DIAGNOSIS — N481 Balanitis: Secondary | ICD-10-CM | POA: Diagnosis not present

## 2020-09-08 DIAGNOSIS — I129 Hypertensive chronic kidney disease with stage 1 through stage 4 chronic kidney disease, or unspecified chronic kidney disease: Secondary | ICD-10-CM | POA: Diagnosis not present

## 2020-09-08 DIAGNOSIS — R9431 Abnormal electrocardiogram [ECG] [EKG]: Secondary | ICD-10-CM | POA: Diagnosis not present

## 2020-09-10 DIAGNOSIS — I498 Other specified cardiac arrhythmias: Secondary | ICD-10-CM | POA: Diagnosis not present

## 2020-09-23 ENCOUNTER — Ambulatory Visit: Payer: Medicare HMO | Admitting: Internal Medicine

## 2020-09-28 ENCOUNTER — Telehealth: Payer: Self-pay

## 2020-09-28 NOTE — Telephone Encounter (Signed)
NOTES ON FILE FROM [facility] [phone]  SENT REFERRAL TO SCHEDULING

## 2020-09-29 DIAGNOSIS — M1990 Unspecified osteoarthritis, unspecified site: Secondary | ICD-10-CM | POA: Diagnosis not present

## 2020-09-29 DIAGNOSIS — I5022 Chronic systolic (congestive) heart failure: Secondary | ICD-10-CM | POA: Diagnosis not present

## 2020-09-29 DIAGNOSIS — D6859 Other primary thrombophilia: Secondary | ICD-10-CM | POA: Diagnosis not present

## 2020-09-29 DIAGNOSIS — I110 Hypertensive heart disease with heart failure: Secondary | ICD-10-CM | POA: Diagnosis not present

## 2020-11-03 DIAGNOSIS — M6281 Muscle weakness (generalized): Secondary | ICD-10-CM | POA: Diagnosis not present

## 2020-11-03 DIAGNOSIS — I5022 Chronic systolic (congestive) heart failure: Secondary | ICD-10-CM | POA: Diagnosis not present

## 2020-11-03 DIAGNOSIS — I2699 Other pulmonary embolism without acute cor pulmonale: Secondary | ICD-10-CM | POA: Diagnosis not present

## 2020-11-04 DIAGNOSIS — I5022 Chronic systolic (congestive) heart failure: Secondary | ICD-10-CM | POA: Diagnosis not present

## 2020-11-04 DIAGNOSIS — I2699 Other pulmonary embolism without acute cor pulmonale: Secondary | ICD-10-CM | POA: Diagnosis not present

## 2020-11-04 DIAGNOSIS — M6281 Muscle weakness (generalized): Secondary | ICD-10-CM | POA: Diagnosis not present

## 2020-11-05 DIAGNOSIS — M6281 Muscle weakness (generalized): Secondary | ICD-10-CM | POA: Diagnosis not present

## 2020-11-05 DIAGNOSIS — I2699 Other pulmonary embolism without acute cor pulmonale: Secondary | ICD-10-CM | POA: Diagnosis not present

## 2020-11-05 DIAGNOSIS — I5022 Chronic systolic (congestive) heart failure: Secondary | ICD-10-CM | POA: Diagnosis not present

## 2020-11-08 DIAGNOSIS — M6281 Muscle weakness (generalized): Secondary | ICD-10-CM | POA: Diagnosis not present

## 2020-11-08 DIAGNOSIS — I5022 Chronic systolic (congestive) heart failure: Secondary | ICD-10-CM | POA: Diagnosis not present

## 2020-11-08 DIAGNOSIS — I2699 Other pulmonary embolism without acute cor pulmonale: Secondary | ICD-10-CM | POA: Diagnosis not present

## 2020-11-09 DIAGNOSIS — M6281 Muscle weakness (generalized): Secondary | ICD-10-CM | POA: Diagnosis not present

## 2020-11-09 DIAGNOSIS — I2699 Other pulmonary embolism without acute cor pulmonale: Secondary | ICD-10-CM | POA: Diagnosis not present

## 2020-11-09 DIAGNOSIS — I5022 Chronic systolic (congestive) heart failure: Secondary | ICD-10-CM | POA: Diagnosis not present

## 2020-11-10 DIAGNOSIS — I2699 Other pulmonary embolism without acute cor pulmonale: Secondary | ICD-10-CM | POA: Diagnosis not present

## 2020-11-10 DIAGNOSIS — I5022 Chronic systolic (congestive) heart failure: Secondary | ICD-10-CM | POA: Diagnosis not present

## 2020-11-10 DIAGNOSIS — M6281 Muscle weakness (generalized): Secondary | ICD-10-CM | POA: Diagnosis not present

## 2020-11-11 DIAGNOSIS — I5022 Chronic systolic (congestive) heart failure: Secondary | ICD-10-CM | POA: Diagnosis not present

## 2020-11-11 DIAGNOSIS — I2699 Other pulmonary embolism without acute cor pulmonale: Secondary | ICD-10-CM | POA: Diagnosis not present

## 2020-11-11 DIAGNOSIS — M6281 Muscle weakness (generalized): Secondary | ICD-10-CM | POA: Diagnosis not present

## 2020-11-12 DIAGNOSIS — M5459 Other low back pain: Secondary | ICD-10-CM | POA: Diagnosis not present

## 2020-11-12 DIAGNOSIS — M62830 Muscle spasm of back: Secondary | ICD-10-CM | POA: Diagnosis not present

## 2020-11-12 DIAGNOSIS — M6281 Muscle weakness (generalized): Secondary | ICD-10-CM | POA: Diagnosis not present

## 2020-11-12 DIAGNOSIS — F0390 Unspecified dementia without behavioral disturbance: Secondary | ICD-10-CM | POA: Diagnosis not present

## 2020-11-12 DIAGNOSIS — M5136 Other intervertebral disc degeneration, lumbar region: Secondary | ICD-10-CM | POA: Diagnosis not present

## 2020-11-15 DIAGNOSIS — M5459 Other low back pain: Secondary | ICD-10-CM | POA: Diagnosis not present

## 2020-11-15 DIAGNOSIS — M5136 Other intervertebral disc degeneration, lumbar region: Secondary | ICD-10-CM | POA: Diagnosis not present

## 2020-11-15 DIAGNOSIS — M6281 Muscle weakness (generalized): Secondary | ICD-10-CM | POA: Diagnosis not present

## 2020-11-15 DIAGNOSIS — F0390 Unspecified dementia without behavioral disturbance: Secondary | ICD-10-CM | POA: Diagnosis not present

## 2020-11-15 DIAGNOSIS — M62830 Muscle spasm of back: Secondary | ICD-10-CM | POA: Diagnosis not present

## 2020-11-16 DIAGNOSIS — M6281 Muscle weakness (generalized): Secondary | ICD-10-CM | POA: Diagnosis not present

## 2020-11-16 DIAGNOSIS — M62830 Muscle spasm of back: Secondary | ICD-10-CM | POA: Diagnosis not present

## 2020-11-16 DIAGNOSIS — F0390 Unspecified dementia without behavioral disturbance: Secondary | ICD-10-CM | POA: Diagnosis not present

## 2020-11-16 DIAGNOSIS — M5459 Other low back pain: Secondary | ICD-10-CM | POA: Diagnosis not present

## 2020-11-16 DIAGNOSIS — M5136 Other intervertebral disc degeneration, lumbar region: Secondary | ICD-10-CM | POA: Diagnosis not present

## 2020-11-17 DIAGNOSIS — M5459 Other low back pain: Secondary | ICD-10-CM | POA: Diagnosis not present

## 2020-11-17 DIAGNOSIS — F0390 Unspecified dementia without behavioral disturbance: Secondary | ICD-10-CM | POA: Diagnosis not present

## 2020-11-17 DIAGNOSIS — M62830 Muscle spasm of back: Secondary | ICD-10-CM | POA: Diagnosis not present

## 2020-11-17 DIAGNOSIS — M6281 Muscle weakness (generalized): Secondary | ICD-10-CM | POA: Diagnosis not present

## 2020-11-17 DIAGNOSIS — M5136 Other intervertebral disc degeneration, lumbar region: Secondary | ICD-10-CM | POA: Diagnosis not present

## 2020-11-18 DIAGNOSIS — M5459 Other low back pain: Secondary | ICD-10-CM | POA: Diagnosis not present

## 2020-11-18 DIAGNOSIS — M62830 Muscle spasm of back: Secondary | ICD-10-CM | POA: Diagnosis not present

## 2020-11-18 DIAGNOSIS — M5136 Other intervertebral disc degeneration, lumbar region: Secondary | ICD-10-CM | POA: Diagnosis not present

## 2020-11-18 DIAGNOSIS — M6281 Muscle weakness (generalized): Secondary | ICD-10-CM | POA: Diagnosis not present

## 2020-11-18 DIAGNOSIS — F0390 Unspecified dementia without behavioral disturbance: Secondary | ICD-10-CM | POA: Diagnosis not present

## 2020-11-19 DIAGNOSIS — M62830 Muscle spasm of back: Secondary | ICD-10-CM | POA: Diagnosis not present

## 2020-11-19 DIAGNOSIS — M5136 Other intervertebral disc degeneration, lumbar region: Secondary | ICD-10-CM | POA: Diagnosis not present

## 2020-11-19 DIAGNOSIS — M6281 Muscle weakness (generalized): Secondary | ICD-10-CM | POA: Diagnosis not present

## 2020-11-19 DIAGNOSIS — M5459 Other low back pain: Secondary | ICD-10-CM | POA: Diagnosis not present

## 2020-11-19 DIAGNOSIS — F0390 Unspecified dementia without behavioral disturbance: Secondary | ICD-10-CM | POA: Diagnosis not present

## 2020-11-22 DIAGNOSIS — M6281 Muscle weakness (generalized): Secondary | ICD-10-CM | POA: Diagnosis not present

## 2020-11-22 DIAGNOSIS — M5459 Other low back pain: Secondary | ICD-10-CM | POA: Diagnosis not present

## 2020-11-22 DIAGNOSIS — F0390 Unspecified dementia without behavioral disturbance: Secondary | ICD-10-CM | POA: Diagnosis not present

## 2020-11-22 DIAGNOSIS — M62830 Muscle spasm of back: Secondary | ICD-10-CM | POA: Diagnosis not present

## 2020-11-22 DIAGNOSIS — M5136 Other intervertebral disc degeneration, lumbar region: Secondary | ICD-10-CM | POA: Diagnosis not present

## 2020-11-23 DIAGNOSIS — M5136 Other intervertebral disc degeneration, lumbar region: Secondary | ICD-10-CM | POA: Diagnosis not present

## 2020-11-23 DIAGNOSIS — M5459 Other low back pain: Secondary | ICD-10-CM | POA: Diagnosis not present

## 2020-11-23 DIAGNOSIS — M6281 Muscle weakness (generalized): Secondary | ICD-10-CM | POA: Diagnosis not present

## 2020-11-23 DIAGNOSIS — M62830 Muscle spasm of back: Secondary | ICD-10-CM | POA: Diagnosis not present

## 2020-11-23 DIAGNOSIS — F0390 Unspecified dementia without behavioral disturbance: Secondary | ICD-10-CM | POA: Diagnosis not present

## 2020-11-24 DIAGNOSIS — F0390 Unspecified dementia without behavioral disturbance: Secondary | ICD-10-CM | POA: Diagnosis not present

## 2020-11-24 DIAGNOSIS — M5136 Other intervertebral disc degeneration, lumbar region: Secondary | ICD-10-CM | POA: Diagnosis not present

## 2020-11-24 DIAGNOSIS — M6281 Muscle weakness (generalized): Secondary | ICD-10-CM | POA: Diagnosis not present

## 2020-11-24 DIAGNOSIS — M62830 Muscle spasm of back: Secondary | ICD-10-CM | POA: Diagnosis not present

## 2020-11-24 DIAGNOSIS — M5459 Other low back pain: Secondary | ICD-10-CM | POA: Diagnosis not present

## 2020-11-25 DIAGNOSIS — M6281 Muscle weakness (generalized): Secondary | ICD-10-CM | POA: Diagnosis not present

## 2020-11-25 DIAGNOSIS — F0390 Unspecified dementia without behavioral disturbance: Secondary | ICD-10-CM | POA: Diagnosis not present

## 2020-11-25 DIAGNOSIS — M5459 Other low back pain: Secondary | ICD-10-CM | POA: Diagnosis not present

## 2020-11-25 DIAGNOSIS — M62830 Muscle spasm of back: Secondary | ICD-10-CM | POA: Diagnosis not present

## 2020-11-25 DIAGNOSIS — M5136 Other intervertebral disc degeneration, lumbar region: Secondary | ICD-10-CM | POA: Diagnosis not present

## 2020-11-26 DIAGNOSIS — M62830 Muscle spasm of back: Secondary | ICD-10-CM | POA: Diagnosis not present

## 2020-11-26 DIAGNOSIS — M6281 Muscle weakness (generalized): Secondary | ICD-10-CM | POA: Diagnosis not present

## 2020-11-26 DIAGNOSIS — F0390 Unspecified dementia without behavioral disturbance: Secondary | ICD-10-CM | POA: Diagnosis not present

## 2020-11-26 DIAGNOSIS — M5136 Other intervertebral disc degeneration, lumbar region: Secondary | ICD-10-CM | POA: Diagnosis not present

## 2020-11-26 DIAGNOSIS — M5459 Other low back pain: Secondary | ICD-10-CM | POA: Diagnosis not present

## 2020-11-29 DIAGNOSIS — M6281 Muscle weakness (generalized): Secondary | ICD-10-CM | POA: Diagnosis not present

## 2020-11-29 DIAGNOSIS — M5459 Other low back pain: Secondary | ICD-10-CM | POA: Diagnosis not present

## 2020-11-29 DIAGNOSIS — M62830 Muscle spasm of back: Secondary | ICD-10-CM | POA: Diagnosis not present

## 2020-11-29 DIAGNOSIS — F0390 Unspecified dementia without behavioral disturbance: Secondary | ICD-10-CM | POA: Diagnosis not present

## 2020-11-29 DIAGNOSIS — M5136 Other intervertebral disc degeneration, lumbar region: Secondary | ICD-10-CM | POA: Diagnosis not present

## 2020-11-30 DIAGNOSIS — F0390 Unspecified dementia without behavioral disturbance: Secondary | ICD-10-CM | POA: Diagnosis not present

## 2020-11-30 DIAGNOSIS — M5136 Other intervertebral disc degeneration, lumbar region: Secondary | ICD-10-CM | POA: Diagnosis not present

## 2020-11-30 DIAGNOSIS — M5459 Other low back pain: Secondary | ICD-10-CM | POA: Diagnosis not present

## 2020-11-30 DIAGNOSIS — M6281 Muscle weakness (generalized): Secondary | ICD-10-CM | POA: Diagnosis not present

## 2020-11-30 DIAGNOSIS — M62830 Muscle spasm of back: Secondary | ICD-10-CM | POA: Diagnosis not present

## 2020-12-01 DIAGNOSIS — M5136 Other intervertebral disc degeneration, lumbar region: Secondary | ICD-10-CM | POA: Diagnosis not present

## 2020-12-01 DIAGNOSIS — F0390 Unspecified dementia without behavioral disturbance: Secondary | ICD-10-CM | POA: Diagnosis not present

## 2020-12-01 DIAGNOSIS — M5459 Other low back pain: Secondary | ICD-10-CM | POA: Diagnosis not present

## 2020-12-01 DIAGNOSIS — M6281 Muscle weakness (generalized): Secondary | ICD-10-CM | POA: Diagnosis not present

## 2020-12-01 DIAGNOSIS — M62830 Muscle spasm of back: Secondary | ICD-10-CM | POA: Diagnosis not present

## 2020-12-02 DIAGNOSIS — M6281 Muscle weakness (generalized): Secondary | ICD-10-CM | POA: Diagnosis not present

## 2020-12-02 DIAGNOSIS — M5459 Other low back pain: Secondary | ICD-10-CM | POA: Diagnosis not present

## 2020-12-02 DIAGNOSIS — M5136 Other intervertebral disc degeneration, lumbar region: Secondary | ICD-10-CM | POA: Diagnosis not present

## 2020-12-02 DIAGNOSIS — F0390 Unspecified dementia without behavioral disturbance: Secondary | ICD-10-CM | POA: Diagnosis not present

## 2020-12-02 DIAGNOSIS — M62830 Muscle spasm of back: Secondary | ICD-10-CM | POA: Diagnosis not present

## 2020-12-03 DIAGNOSIS — M62830 Muscle spasm of back: Secondary | ICD-10-CM | POA: Diagnosis not present

## 2020-12-03 DIAGNOSIS — M5459 Other low back pain: Secondary | ICD-10-CM | POA: Diagnosis not present

## 2020-12-03 DIAGNOSIS — M6281 Muscle weakness (generalized): Secondary | ICD-10-CM | POA: Diagnosis not present

## 2020-12-03 DIAGNOSIS — M5136 Other intervertebral disc degeneration, lumbar region: Secondary | ICD-10-CM | POA: Diagnosis not present

## 2020-12-03 DIAGNOSIS — F0390 Unspecified dementia without behavioral disturbance: Secondary | ICD-10-CM | POA: Diagnosis not present

## 2020-12-06 DIAGNOSIS — F325 Major depressive disorder, single episode, in full remission: Secondary | ICD-10-CM | POA: Diagnosis not present

## 2020-12-06 DIAGNOSIS — M25562 Pain in left knee: Secondary | ICD-10-CM | POA: Diagnosis not present

## 2020-12-06 DIAGNOSIS — F0150 Vascular dementia without behavioral disturbance: Secondary | ICD-10-CM | POA: Diagnosis not present

## 2020-12-06 DIAGNOSIS — I2699 Other pulmonary embolism without acute cor pulmonale: Secondary | ICD-10-CM | POA: Diagnosis not present

## 2020-12-06 DIAGNOSIS — M5459 Other low back pain: Secondary | ICD-10-CM | POA: Diagnosis not present

## 2020-12-06 DIAGNOSIS — M5136 Other intervertebral disc degeneration, lumbar region: Secondary | ICD-10-CM | POA: Diagnosis not present

## 2020-12-06 DIAGNOSIS — I5040 Unspecified combined systolic (congestive) and diastolic (congestive) heart failure: Secondary | ICD-10-CM | POA: Diagnosis not present

## 2020-12-06 DIAGNOSIS — M62830 Muscle spasm of back: Secondary | ICD-10-CM | POA: Diagnosis not present

## 2020-12-06 DIAGNOSIS — F0390 Unspecified dementia without behavioral disturbance: Secondary | ICD-10-CM | POA: Diagnosis not present

## 2020-12-06 DIAGNOSIS — I2510 Atherosclerotic heart disease of native coronary artery without angina pectoris: Secondary | ICD-10-CM | POA: Diagnosis not present

## 2020-12-06 DIAGNOSIS — M6281 Muscle weakness (generalized): Secondary | ICD-10-CM | POA: Diagnosis not present

## 2020-12-07 DIAGNOSIS — M5459 Other low back pain: Secondary | ICD-10-CM | POA: Diagnosis not present

## 2020-12-07 DIAGNOSIS — M5136 Other intervertebral disc degeneration, lumbar region: Secondary | ICD-10-CM | POA: Diagnosis not present

## 2020-12-07 DIAGNOSIS — M6281 Muscle weakness (generalized): Secondary | ICD-10-CM | POA: Diagnosis not present

## 2020-12-07 DIAGNOSIS — F0390 Unspecified dementia without behavioral disturbance: Secondary | ICD-10-CM | POA: Diagnosis not present

## 2020-12-07 DIAGNOSIS — M62830 Muscle spasm of back: Secondary | ICD-10-CM | POA: Diagnosis not present

## 2020-12-08 DIAGNOSIS — M5459 Other low back pain: Secondary | ICD-10-CM | POA: Diagnosis not present

## 2020-12-08 DIAGNOSIS — M6281 Muscle weakness (generalized): Secondary | ICD-10-CM | POA: Diagnosis not present

## 2020-12-08 DIAGNOSIS — F0390 Unspecified dementia without behavioral disturbance: Secondary | ICD-10-CM | POA: Diagnosis not present

## 2020-12-08 DIAGNOSIS — M62830 Muscle spasm of back: Secondary | ICD-10-CM | POA: Diagnosis not present

## 2020-12-08 DIAGNOSIS — M5136 Other intervertebral disc degeneration, lumbar region: Secondary | ICD-10-CM | POA: Diagnosis not present

## 2020-12-17 ENCOUNTER — Ambulatory Visit: Payer: Medicare HMO | Admitting: Cardiovascular Disease

## 2020-12-27 DIAGNOSIS — M1990 Unspecified osteoarthritis, unspecified site: Secondary | ICD-10-CM | POA: Diagnosis not present

## 2020-12-27 DIAGNOSIS — Z9189 Other specified personal risk factors, not elsewhere classified: Secondary | ICD-10-CM | POA: Diagnosis not present

## 2020-12-27 DIAGNOSIS — M1712 Unilateral primary osteoarthritis, left knee: Secondary | ICD-10-CM | POA: Diagnosis not present

## 2021-01-07 DIAGNOSIS — I739 Peripheral vascular disease, unspecified: Secondary | ICD-10-CM | POA: Diagnosis not present

## 2021-01-07 DIAGNOSIS — M2011 Hallux valgus (acquired), right foot: Secondary | ICD-10-CM | POA: Diagnosis not present

## 2021-01-07 DIAGNOSIS — M2012 Hallux valgus (acquired), left foot: Secondary | ICD-10-CM | POA: Diagnosis not present

## 2021-01-07 DIAGNOSIS — B351 Tinea unguium: Secondary | ICD-10-CM | POA: Diagnosis not present

## 2021-01-21 ENCOUNTER — Encounter: Payer: Self-pay | Admitting: Cardiovascular Disease

## 2021-01-21 ENCOUNTER — Ambulatory Visit (INDEPENDENT_AMBULATORY_CARE_PROVIDER_SITE_OTHER): Payer: Medicare HMO | Admitting: Cardiovascular Disease

## 2021-01-21 ENCOUNTER — Other Ambulatory Visit: Payer: Self-pay

## 2021-01-21 VITALS — BP 106/61 | HR 84 | Ht 66.0 in

## 2021-01-21 DIAGNOSIS — N1831 Chronic kidney disease, stage 3a: Secondary | ICD-10-CM | POA: Diagnosis not present

## 2021-01-21 DIAGNOSIS — I2510 Atherosclerotic heart disease of native coronary artery without angina pectoris: Secondary | ICD-10-CM | POA: Diagnosis not present

## 2021-01-21 DIAGNOSIS — I5042 Chronic combined systolic (congestive) and diastolic (congestive) heart failure: Secondary | ICD-10-CM

## 2021-01-21 DIAGNOSIS — Z8673 Personal history of transient ischemic attack (TIA), and cerebral infarction without residual deficits: Secondary | ICD-10-CM | POA: Diagnosis not present

## 2021-01-21 DIAGNOSIS — Z86711 Personal history of pulmonary embolism: Secondary | ICD-10-CM | POA: Diagnosis not present

## 2021-01-21 DIAGNOSIS — I493 Ventricular premature depolarization: Secondary | ICD-10-CM
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
## 2021-01-21 NOTE — Patient Instructions (Signed)
Medication Instructions:  No changes *If you need a refill on your cardiac medications before your next appointment, please call your pharmacy*   Lab Work: None ordered If you have labs (blood work) drawn today and your tests are completely normal, you will receive your results only by: MyChart Message (if you have MyChart) OR A paper copy in the mail If you have any lab test that is abnormal or we need to change your treatment, we will call you to review the results.   Testing/Procedures: None ordered   Follow-Up: At [facility], you and your health needs are our priority.  As part of our continuing mission to provide you with exceptional heart care, we have created designated Provider Care Teams.  These Care Teams include your primary Cardiologist (physician) and Advanced Practice Providers (APPs -  Physician Assistants and Nurse Practitioners) who all work together to provide you with the care you need, when you need it.  We recommend signing up for the patient portal called "MyChart".  Sign up information is provided on this After Visit Summary.  MyChart is used to connect with patients for Virtual Visits (Telemedicine).  Patients are able to view lab/test results, encounter notes, upcoming appointments, etc.  Non-urgent messages can be sent to your provider as well.   To learn more about what you can do with MyChart, go to [email or web address].    Your next appointment:   12 month(s)  The format for your next appointment:   In Person  Provider:   You may see Dr. Croitoru or one of the following Advanced Practice Providers on your designated Care Team:   Hao Meng, PA-C Angela Duke, PA-C or  Krista Kroeger, PA-C    

## 2021-01-31 DIAGNOSIS — I714 Abdominal aortic aneurysm, without rupture: Secondary | ICD-10-CM | POA: Diagnosis not present

## 2021-01-31 DIAGNOSIS — I110 Hypertensive heart disease with heart failure: Secondary | ICD-10-CM | POA: Diagnosis not present

## 2021-01-31 DIAGNOSIS — I2510 Atherosclerotic heart disease of native coronary artery without angina pectoris: Secondary | ICD-10-CM | POA: Diagnosis not present

## 2021-01-31 DIAGNOSIS — D6859 Other primary thrombophilia: Secondary | ICD-10-CM | POA: Diagnosis not present

## 2021-01-31 DIAGNOSIS — I129 Hypertensive chronic kidney disease with stage 1 through stage 4 chronic kidney disease, or unspecified chronic kidney disease: Secondary | ICD-10-CM | POA: Diagnosis not present

## 2021-01-31 DIAGNOSIS — I679 Cerebrovascular disease, unspecified: Secondary | ICD-10-CM | POA: Diagnosis not present

## 2021-01-31 DIAGNOSIS — M1990 Unspecified osteoarthritis, unspecified site: Secondary | ICD-10-CM | POA: Diagnosis not present

## 2021-01-31 DIAGNOSIS — I5022 Chronic systolic (congestive) heart failure: Secondary | ICD-10-CM | POA: Diagnosis not present

## 2021-01-31 DIAGNOSIS — R262 Difficulty in walking, not elsewhere classified: Secondary | ICD-10-CM | POA: Diagnosis not present

## 2021-02-01 DIAGNOSIS — M5459 Other low back pain: Secondary | ICD-10-CM | POA: Diagnosis not present

## 2021-02-01 DIAGNOSIS — F0390 Unspecified dementia without behavioral disturbance: Secondary | ICD-10-CM | POA: Diagnosis not present

## 2021-02-01 DIAGNOSIS — M5136 Other intervertebral disc degeneration, lumbar region: Secondary | ICD-10-CM | POA: Diagnosis not present

## 2021-02-01 DIAGNOSIS — M62830 Muscle spasm of back: Secondary | ICD-10-CM | POA: Diagnosis not present

## 2021-02-01 DIAGNOSIS — M6281 Muscle weakness (generalized): Secondary | ICD-10-CM | POA: Diagnosis not present

## 2021-02-02 DIAGNOSIS — F0390 Unspecified dementia without behavioral disturbance: Secondary | ICD-10-CM | POA: Diagnosis not present

## 2021-02-02 DIAGNOSIS — M6281 Muscle weakness (generalized): Secondary | ICD-10-CM | POA: Diagnosis not present

## 2021-02-02 DIAGNOSIS — M5136 Other intervertebral disc degeneration, lumbar region: Secondary | ICD-10-CM | POA: Diagnosis not present

## 2021-02-02 DIAGNOSIS — D513 Other dietary vitamin B12 deficiency anemia: Secondary | ICD-10-CM | POA: Diagnosis not present

## 2021-02-02 DIAGNOSIS — I10 Essential (primary) hypertension: Secondary | ICD-10-CM | POA: Diagnosis not present

## 2021-02-02 DIAGNOSIS — E569 Vitamin deficiency, unspecified: Secondary | ICD-10-CM | POA: Diagnosis not present

## 2021-02-02 DIAGNOSIS — I499 Cardiac arrhythmia, unspecified: Secondary | ICD-10-CM | POA: Diagnosis not present

## 2021-02-02 DIAGNOSIS — N5082 Scrotal pain: Secondary | ICD-10-CM | POA: Diagnosis not present

## 2021-02-02 DIAGNOSIS — I2510 Atherosclerotic heart disease of native coronary artery without angina pectoris: Secondary | ICD-10-CM | POA: Diagnosis not present

## 2021-02-02 DIAGNOSIS — M5459 Other low back pain: Secondary | ICD-10-CM | POA: Diagnosis not present

## 2021-02-02 DIAGNOSIS — E559 Vitamin D deficiency, unspecified: Secondary | ICD-10-CM | POA: Diagnosis not present

## 2021-02-02 DIAGNOSIS — M62830 Muscle spasm of back: Secondary | ICD-10-CM | POA: Diagnosis not present

## 2021-02-02 DIAGNOSIS — I5040 Unspecified combined systolic (congestive) and diastolic (congestive) heart failure: Secondary | ICD-10-CM | POA: Diagnosis not present

## 2021-02-03 DIAGNOSIS — M6281 Muscle weakness (generalized): Secondary | ICD-10-CM | POA: Diagnosis not present

## 2021-02-03 DIAGNOSIS — M62830 Muscle spasm of back: Secondary | ICD-10-CM | POA: Diagnosis not present

## 2021-02-03 DIAGNOSIS — F0390 Unspecified dementia without behavioral disturbance: Secondary | ICD-10-CM | POA: Diagnosis not present

## 2021-02-03 DIAGNOSIS — M5459 Other low back pain: Secondary | ICD-10-CM | POA: Diagnosis not present

## 2021-02-03 DIAGNOSIS — M5136 Other intervertebral disc degeneration, lumbar region: Secondary | ICD-10-CM | POA: Diagnosis not present

## 2021-02-04 DIAGNOSIS — D696 Thrombocytopenia, unspecified: Secondary | ICD-10-CM | POA: Diagnosis not present

## 2021-02-04 DIAGNOSIS — I2699 Other pulmonary embolism without acute cor pulmonale: Secondary | ICD-10-CM | POA: Diagnosis not present

## 2021-02-04 DIAGNOSIS — N183 Chronic kidney disease, stage 3 unspecified: Secondary | ICD-10-CM | POA: Diagnosis not present

## 2021-02-04 DIAGNOSIS — M5459 Other low back pain: Secondary | ICD-10-CM | POA: Diagnosis not present

## 2021-02-04 DIAGNOSIS — M5136 Other intervertebral disc degeneration, lumbar region: Secondary | ICD-10-CM | POA: Diagnosis not present

## 2021-02-04 DIAGNOSIS — F0390 Unspecified dementia without behavioral disturbance: Secondary | ICD-10-CM | POA: Diagnosis not present

## 2021-02-04 DIAGNOSIS — M6281 Muscle weakness (generalized): Secondary | ICD-10-CM | POA: Diagnosis not present

## 2021-02-04 DIAGNOSIS — M62830 Muscle spasm of back: Secondary | ICD-10-CM | POA: Diagnosis not present

## 2021-02-07 DIAGNOSIS — M62830 Muscle spasm of back: Secondary | ICD-10-CM | POA: Diagnosis not present

## 2021-02-07 DIAGNOSIS — F0390 Unspecified dementia without behavioral disturbance: Secondary | ICD-10-CM | POA: Diagnosis not present

## 2021-02-07 DIAGNOSIS — M6281 Muscle weakness (generalized): Secondary | ICD-10-CM | POA: Diagnosis not present

## 2021-02-07 DIAGNOSIS — M5459 Other low back pain: Secondary | ICD-10-CM | POA: Diagnosis not present

## 2021-02-07 DIAGNOSIS — D696 Thrombocytopenia, unspecified: Secondary | ICD-10-CM | POA: Diagnosis not present

## 2021-02-07 DIAGNOSIS — M5136 Other intervertebral disc degeneration, lumbar region: Secondary | ICD-10-CM | POA: Diagnosis not present

## 2021-02-08 DIAGNOSIS — M5459 Other low back pain: Secondary | ICD-10-CM | POA: Diagnosis not present

## 2021-02-08 DIAGNOSIS — M6281 Muscle weakness (generalized): Secondary | ICD-10-CM | POA: Diagnosis not present

## 2021-02-08 DIAGNOSIS — F0390 Unspecified dementia without behavioral disturbance: Secondary | ICD-10-CM | POA: Diagnosis not present

## 2021-02-08 DIAGNOSIS — M5136 Other intervertebral disc degeneration, lumbar region: Secondary | ICD-10-CM | POA: Diagnosis not present

## 2021-02-08 DIAGNOSIS — M62830 Muscle spasm of back: Secondary | ICD-10-CM | POA: Diagnosis not present

## 2021-02-09 DIAGNOSIS — F0390 Unspecified dementia without behavioral disturbance: Secondary | ICD-10-CM | POA: Diagnosis not present

## 2021-02-09 DIAGNOSIS — M62830 Muscle spasm of back: Secondary | ICD-10-CM | POA: Diagnosis not present

## 2021-02-09 DIAGNOSIS — M5136 Other intervertebral disc degeneration, lumbar region: Secondary | ICD-10-CM | POA: Diagnosis not present

## 2021-02-09 DIAGNOSIS — N183 Chronic kidney disease, stage 3 unspecified: Secondary | ICD-10-CM | POA: Diagnosis not present

## 2021-02-09 DIAGNOSIS — D696 Thrombocytopenia, unspecified: Secondary | ICD-10-CM | POA: Diagnosis not present

## 2021-02-09 DIAGNOSIS — M5459 Other low back pain: Secondary | ICD-10-CM | POA: Diagnosis not present

## 2021-02-09 DIAGNOSIS — M6281 Muscle weakness (generalized): Secondary | ICD-10-CM | POA: Diagnosis not present

## 2021-02-10 DIAGNOSIS — M5136 Other intervertebral disc degeneration, lumbar region: Secondary | ICD-10-CM | POA: Diagnosis not present

## 2021-02-10 DIAGNOSIS — M62830 Muscle spasm of back: Secondary | ICD-10-CM | POA: Diagnosis not present

## 2021-02-10 DIAGNOSIS — F0390 Unspecified dementia without behavioral disturbance: Secondary | ICD-10-CM | POA: Diagnosis not present

## 2021-02-10 DIAGNOSIS — M6281 Muscle weakness (generalized): Secondary | ICD-10-CM | POA: Diagnosis not present

## 2021-02-10 DIAGNOSIS — M5459 Other low back pain: Secondary | ICD-10-CM | POA: Diagnosis not present

## 2021-02-11 DIAGNOSIS — F0390 Unspecified dementia without behavioral disturbance: Secondary | ICD-10-CM | POA: Diagnosis not present

## 2021-02-11 DIAGNOSIS — M5136 Other intervertebral disc degeneration, lumbar region: Secondary | ICD-10-CM | POA: Diagnosis not present

## 2021-02-11 DIAGNOSIS — M62830 Muscle spasm of back: Secondary | ICD-10-CM | POA: Diagnosis not present

## 2021-02-11 DIAGNOSIS — M5459 Other low back pain: Secondary | ICD-10-CM | POA: Diagnosis not present

## 2021-02-11 DIAGNOSIS — M6281 Muscle weakness (generalized): Secondary | ICD-10-CM | POA: Diagnosis not present

## 2021-02-14 DIAGNOSIS — F0390 Unspecified dementia without behavioral disturbance: Secondary | ICD-10-CM | POA: Diagnosis not present

## 2021-02-14 DIAGNOSIS — R41841 Cognitive communication deficit: Secondary | ICD-10-CM | POA: Diagnosis not present

## 2021-02-15 DIAGNOSIS — F0390 Unspecified dementia without behavioral disturbance: Secondary | ICD-10-CM | POA: Diagnosis not present

## 2021-02-15 DIAGNOSIS — R41841 Cognitive communication deficit: Secondary | ICD-10-CM | POA: Diagnosis not present

## 2021-02-16 DIAGNOSIS — F0390 Unspecified dementia without behavioral disturbance: Secondary | ICD-10-CM | POA: Diagnosis not present

## 2021-02-16 DIAGNOSIS — R41841 Cognitive communication deficit: Secondary | ICD-10-CM | POA: Diagnosis not present

## 2021-02-17 DIAGNOSIS — R41841 Cognitive communication deficit: Secondary | ICD-10-CM | POA: Diagnosis not present

## 2021-02-17 DIAGNOSIS — F0390 Unspecified dementia without behavioral disturbance: Secondary | ICD-10-CM | POA: Diagnosis not present

## 2021-02-18 DIAGNOSIS — R41841 Cognitive communication deficit: Secondary | ICD-10-CM | POA: Diagnosis not present

## 2021-02-18 DIAGNOSIS — F0390 Unspecified dementia without behavioral disturbance: Secondary | ICD-10-CM | POA: Diagnosis not present

## 2021-02-21 DIAGNOSIS — F0390 Unspecified dementia without behavioral disturbance: Secondary | ICD-10-CM | POA: Diagnosis not present

## 2021-02-21 DIAGNOSIS — R41841 Cognitive communication deficit: Secondary | ICD-10-CM | POA: Diagnosis not present

## 2021-02-22 DIAGNOSIS — R41841 Cognitive communication deficit: Secondary | ICD-10-CM | POA: Diagnosis not present

## 2021-02-22 DIAGNOSIS — F0390 Unspecified dementia without behavioral disturbance: Secondary | ICD-10-CM | POA: Diagnosis not present

## 2021-02-23 DIAGNOSIS — F0390 Unspecified dementia without behavioral disturbance: Secondary | ICD-10-CM | POA: Diagnosis not present

## 2021-02-23 DIAGNOSIS — R41841 Cognitive communication deficit: Secondary | ICD-10-CM | POA: Diagnosis not present

## 2021-02-24 DIAGNOSIS — R41841 Cognitive communication deficit: Secondary | ICD-10-CM | POA: Diagnosis not present

## 2021-02-24 DIAGNOSIS — F0390 Unspecified dementia without behavioral disturbance: Secondary | ICD-10-CM | POA: Diagnosis not present

## 2021-02-25 DIAGNOSIS — R41841 Cognitive communication deficit: Secondary | ICD-10-CM | POA: Diagnosis not present

## 2021-02-25 DIAGNOSIS — F0390 Unspecified dementia without behavioral disturbance: Secondary | ICD-10-CM | POA: Diagnosis not present

## 2021-02-28 DIAGNOSIS — F0390 Unspecified dementia without behavioral disturbance: Secondary | ICD-10-CM | POA: Diagnosis not present

## 2021-02-28 DIAGNOSIS — R41841 Cognitive communication deficit: Secondary | ICD-10-CM | POA: Diagnosis not present

## 2021-03-01 DIAGNOSIS — R41841 Cognitive communication deficit: Secondary | ICD-10-CM | POA: Diagnosis not present

## 2021-03-01 DIAGNOSIS — F0390 Unspecified dementia without behavioral disturbance: Secondary | ICD-10-CM | POA: Diagnosis not present

## 2021-03-02 DIAGNOSIS — F0390 Unspecified dementia without behavioral disturbance: Secondary | ICD-10-CM | POA: Diagnosis not present

## 2021-03-02 DIAGNOSIS — R41841 Cognitive communication deficit: Secondary | ICD-10-CM | POA: Diagnosis not present

## 2021-03-28 DIAGNOSIS — Z23 Encounter for immunization: Secondary | ICD-10-CM | POA: Diagnosis not present

## 2021-03-28 DIAGNOSIS — F0390 Unspecified dementia without behavioral disturbance: Secondary | ICD-10-CM | POA: Diagnosis not present

## 2021-03-28 DIAGNOSIS — R41841 Cognitive communication deficit: Secondary | ICD-10-CM | POA: Diagnosis not present

## 2021-04-05 ENCOUNTER — Encounter: Payer: Medicare HMO | Admitting: Internal Medicine

## 2021-04-28 DIAGNOSIS — M6281 Muscle weakness (generalized): Secondary | ICD-10-CM | POA: Diagnosis not present

## 2021-04-29 DIAGNOSIS — M6281 Muscle weakness (generalized): Secondary | ICD-10-CM | POA: Diagnosis not present

## 2021-04-30 DIAGNOSIS — M6281 Muscle weakness (generalized): Secondary | ICD-10-CM | POA: Diagnosis not present

## 2021-05-02 DIAGNOSIS — M6281 Muscle weakness (generalized): Secondary | ICD-10-CM | POA: Diagnosis not present

## 2021-05-03 DIAGNOSIS — M6281 Muscle weakness (generalized): Secondary | ICD-10-CM | POA: Diagnosis not present

## 2021-05-04 DIAGNOSIS — M6281 Muscle weakness (generalized): Secondary | ICD-10-CM | POA: Diagnosis not present

## 2021-05-05 DIAGNOSIS — M6281 Muscle weakness (generalized): Secondary | ICD-10-CM | POA: Diagnosis not present

## 2021-05-07 DIAGNOSIS — M6281 Muscle weakness (generalized): Secondary | ICD-10-CM | POA: Diagnosis not present

## 2021-05-10 DIAGNOSIS — M6281 Muscle weakness (generalized): Secondary | ICD-10-CM | POA: Diagnosis not present

## 2021-05-11 DIAGNOSIS — M6281 Muscle weakness (generalized): Secondary | ICD-10-CM | POA: Diagnosis not present

## 2021-05-12 DIAGNOSIS — M6281 Muscle weakness (generalized): Secondary | ICD-10-CM | POA: Diagnosis not present

## 2021-05-13 DIAGNOSIS — M6281 Muscle weakness (generalized): Secondary | ICD-10-CM | POA: Diagnosis not present

## 2021-05-14 DIAGNOSIS — M6281 Muscle weakness (generalized): Secondary | ICD-10-CM | POA: Diagnosis not present

## 2021-05-16 DIAGNOSIS — M6281 Muscle weakness (generalized): Secondary | ICD-10-CM | POA: Diagnosis not present

## 2021-05-16 DIAGNOSIS — I2699 Other pulmonary embolism without acute cor pulmonale: Secondary | ICD-10-CM | POA: Diagnosis not present

## 2021-05-17 DIAGNOSIS — I2699 Other pulmonary embolism without acute cor pulmonale: Secondary | ICD-10-CM | POA: Diagnosis not present

## 2021-05-17 DIAGNOSIS — M6281 Muscle weakness (generalized): Secondary | ICD-10-CM | POA: Diagnosis not present

## 2021-05-19 DIAGNOSIS — I2699 Other pulmonary embolism without acute cor pulmonale: Secondary | ICD-10-CM | POA: Diagnosis not present

## 2021-05-19 DIAGNOSIS — M6281 Muscle weakness (generalized): Secondary | ICD-10-CM | POA: Diagnosis not present

## 2021-05-20 DIAGNOSIS — M6281 Muscle weakness (generalized): Secondary | ICD-10-CM | POA: Diagnosis not present

## 2021-05-20 DIAGNOSIS — I2699 Other pulmonary embolism without acute cor pulmonale: Secondary | ICD-10-CM | POA: Diagnosis not present

## 2021-05-21 DIAGNOSIS — M6281 Muscle weakness (generalized): Secondary | ICD-10-CM | POA: Diagnosis not present

## 2021-05-21 DIAGNOSIS — I2699 Other pulmonary embolism without acute cor pulmonale: Secondary | ICD-10-CM | POA: Diagnosis not present

## 2021-05-23 DIAGNOSIS — I712 Thoracic aortic aneurysm, without rupture, unspecified: Secondary | ICD-10-CM | POA: Diagnosis not present

## 2021-05-23 DIAGNOSIS — M1712 Unilateral primary osteoarthritis, left knee: Secondary | ICD-10-CM | POA: Diagnosis not present

## 2021-05-23 DIAGNOSIS — D6859 Other primary thrombophilia: Secondary | ICD-10-CM | POA: Diagnosis not present

## 2021-05-23 DIAGNOSIS — M5136 Other intervertebral disc degeneration, lumbar region: Secondary | ICD-10-CM | POA: Diagnosis not present

## 2021-05-23 DIAGNOSIS — Z86711 Personal history of pulmonary embolism: Secondary | ICD-10-CM | POA: Diagnosis not present

## 2021-05-23 DIAGNOSIS — I5042 Chronic combined systolic (congestive) and diastolic (congestive) heart failure: Secondary | ICD-10-CM | POA: Diagnosis not present

## 2021-05-23 DIAGNOSIS — I2510 Atherosclerotic heart disease of native coronary artery without angina pectoris: Secondary | ICD-10-CM | POA: Diagnosis not present

## 2021-05-23 DIAGNOSIS — N1831 Chronic kidney disease, stage 3a: Secondary | ICD-10-CM | POA: Diagnosis not present

## 2021-05-23 DIAGNOSIS — I130 Hypertensive heart and chronic kidney disease with heart failure and stage 1 through stage 4 chronic kidney disease, or unspecified chronic kidney disease: Secondary | ICD-10-CM | POA: Diagnosis not present

## 2021-05-24 DIAGNOSIS — I2699 Other pulmonary embolism without acute cor pulmonale: Secondary | ICD-10-CM | POA: Diagnosis not present

## 2021-05-24 DIAGNOSIS — M6281 Muscle weakness (generalized): Secondary | ICD-10-CM | POA: Diagnosis not present

## 2021-05-25 DIAGNOSIS — I2699 Other pulmonary embolism without acute cor pulmonale: Secondary | ICD-10-CM | POA: Diagnosis not present

## 2021-05-25 DIAGNOSIS — M6281 Muscle weakness (generalized): Secondary | ICD-10-CM | POA: Diagnosis not present

## 2021-05-26 DIAGNOSIS — I2699 Other pulmonary embolism without acute cor pulmonale: Secondary | ICD-10-CM | POA: Diagnosis not present

## 2021-05-26 DIAGNOSIS — M6281 Muscle weakness (generalized): Secondary | ICD-10-CM | POA: Diagnosis not present

## 2021-05-26 DIAGNOSIS — F3342 Major depressive disorder, recurrent, in full remission: Secondary | ICD-10-CM | POA: Diagnosis not present

## 2021-05-27 DIAGNOSIS — I2699 Other pulmonary embolism without acute cor pulmonale: Secondary | ICD-10-CM | POA: Diagnosis not present

## 2021-05-27 DIAGNOSIS — M6281 Muscle weakness (generalized): Secondary | ICD-10-CM | POA: Diagnosis not present

## 2021-05-28 DIAGNOSIS — I2699 Other pulmonary embolism without acute cor pulmonale: Secondary | ICD-10-CM | POA: Diagnosis not present

## 2021-05-28 DIAGNOSIS — M6281 Muscle weakness (generalized): Secondary | ICD-10-CM | POA: Diagnosis not present

## 2021-05-31 DIAGNOSIS — M6281 Muscle weakness (generalized): Secondary | ICD-10-CM | POA: Diagnosis not present

## 2021-05-31 DIAGNOSIS — I2699 Other pulmonary embolism without acute cor pulmonale: Secondary | ICD-10-CM | POA: Diagnosis not present

## 2021-06-01 DIAGNOSIS — I2699 Other pulmonary embolism without acute cor pulmonale: Secondary | ICD-10-CM | POA: Diagnosis not present

## 2021-06-01 DIAGNOSIS — M6281 Muscle weakness (generalized): Secondary | ICD-10-CM | POA: Diagnosis not present

## 2021-06-02 DIAGNOSIS — M6281 Muscle weakness (generalized): Secondary | ICD-10-CM | POA: Diagnosis not present

## 2021-06-02 DIAGNOSIS — I2699 Other pulmonary embolism without acute cor pulmonale: Secondary | ICD-10-CM | POA: Diagnosis not present

## 2021-06-03 DIAGNOSIS — I2699 Other pulmonary embolism without acute cor pulmonale: Secondary | ICD-10-CM | POA: Diagnosis not present

## 2021-06-03 DIAGNOSIS — M6281 Muscle weakness (generalized): Secondary | ICD-10-CM | POA: Diagnosis not present

## 2021-06-04 DIAGNOSIS — I2699 Other pulmonary embolism without acute cor pulmonale: Secondary | ICD-10-CM | POA: Diagnosis not present

## 2021-06-04 DIAGNOSIS — M6281 Muscle weakness (generalized): Secondary | ICD-10-CM | POA: Diagnosis not present

## 2021-06-06 DIAGNOSIS — E440 Moderate protein-calorie malnutrition: Secondary | ICD-10-CM | POA: Diagnosis not present

## 2021-06-06 DIAGNOSIS — I129 Hypertensive chronic kidney disease with stage 1 through stage 4 chronic kidney disease, or unspecified chronic kidney disease: Secondary | ICD-10-CM | POA: Diagnosis not present

## 2021-06-06 DIAGNOSIS — M1990 Unspecified osteoarthritis, unspecified site: Secondary | ICD-10-CM | POA: Diagnosis not present

## 2021-06-06 DIAGNOSIS — D6859 Other primary thrombophilia: Secondary | ICD-10-CM | POA: Diagnosis not present

## 2021-06-06 DIAGNOSIS — I714 Abdominal aortic aneurysm, without rupture, unspecified: Secondary | ICD-10-CM | POA: Diagnosis not present

## 2021-06-06 DIAGNOSIS — I679 Cerebrovascular disease, unspecified: Secondary | ICD-10-CM | POA: Diagnosis not present

## 2021-06-06 DIAGNOSIS — I110 Hypertensive heart disease with heart failure: Secondary | ICD-10-CM | POA: Diagnosis not present

## 2021-06-06 DIAGNOSIS — I2510 Atherosclerotic heart disease of native coronary artery without angina pectoris: Secondary | ICD-10-CM | POA: Diagnosis not present

## 2021-06-06 DIAGNOSIS — Z7409 Other reduced mobility: Secondary | ICD-10-CM | POA: Diagnosis not present

## 2021-06-07 DIAGNOSIS — M6281 Muscle weakness (generalized): Secondary | ICD-10-CM | POA: Diagnosis not present

## 2021-06-07 DIAGNOSIS — I2699 Other pulmonary embolism without acute cor pulmonale: Secondary | ICD-10-CM | POA: Diagnosis not present

## 2021-06-08 DIAGNOSIS — M6281 Muscle weakness (generalized): Secondary | ICD-10-CM | POA: Diagnosis not present

## 2021-06-08 DIAGNOSIS — I2699 Other pulmonary embolism without acute cor pulmonale: Secondary | ICD-10-CM | POA: Diagnosis not present

## 2021-06-09 DIAGNOSIS — M6281 Muscle weakness (generalized): Secondary | ICD-10-CM | POA: Diagnosis not present

## 2021-06-09 DIAGNOSIS — F3342 Major depressive disorder, recurrent, in full remission: Secondary | ICD-10-CM | POA: Diagnosis not present

## 2021-06-09 DIAGNOSIS — I2699 Other pulmonary embolism without acute cor pulmonale: Secondary | ICD-10-CM | POA: Diagnosis not present

## 2021-06-10 DIAGNOSIS — I2699 Other pulmonary embolism without acute cor pulmonale: Secondary | ICD-10-CM | POA: Diagnosis not present

## 2021-06-10 DIAGNOSIS — M6281 Muscle weakness (generalized): Secondary | ICD-10-CM | POA: Diagnosis not present

## 2021-06-11 DIAGNOSIS — I2699 Other pulmonary embolism without acute cor pulmonale: Secondary | ICD-10-CM | POA: Diagnosis not present

## 2021-06-11 DIAGNOSIS — M6281 Muscle weakness (generalized): Secondary | ICD-10-CM | POA: Diagnosis not present

## 2021-06-14 DIAGNOSIS — I2699 Other pulmonary embolism without acute cor pulmonale: Secondary | ICD-10-CM | POA: Diagnosis not present

## 2021-06-14 DIAGNOSIS — M6281 Muscle weakness (generalized): Secondary | ICD-10-CM | POA: Diagnosis not present

## 2021-06-15 DIAGNOSIS — I2699 Other pulmonary embolism without acute cor pulmonale: Secondary | ICD-10-CM | POA: Diagnosis not present

## 2021-06-15 DIAGNOSIS — M62830 Muscle spasm of back: Secondary | ICD-10-CM | POA: Diagnosis not present

## 2021-06-15 DIAGNOSIS — R293 Abnormal posture: Secondary | ICD-10-CM | POA: Diagnosis not present

## 2021-06-15 DIAGNOSIS — M6281 Muscle weakness (generalized): Secondary | ICD-10-CM | POA: Diagnosis not present

## 2021-06-15 DIAGNOSIS — M5136 Other intervertebral disc degeneration, lumbar region: Secondary | ICD-10-CM | POA: Diagnosis not present

## 2021-06-15 DIAGNOSIS — M545 Low back pain, unspecified: Secondary | ICD-10-CM | POA: Diagnosis not present

## 2021-06-15 DIAGNOSIS — M109 Gout, unspecified: Secondary | ICD-10-CM | POA: Diagnosis not present

## 2021-06-16 DIAGNOSIS — M109 Gout, unspecified: Secondary | ICD-10-CM | POA: Diagnosis not present

## 2021-06-16 DIAGNOSIS — M545 Low back pain, unspecified: Secondary | ICD-10-CM | POA: Diagnosis not present

## 2021-06-16 DIAGNOSIS — I2699 Other pulmonary embolism without acute cor pulmonale: Secondary | ICD-10-CM | POA: Diagnosis not present

## 2021-06-16 DIAGNOSIS — M5136 Other intervertebral disc degeneration, lumbar region: Secondary | ICD-10-CM | POA: Diagnosis not present

## 2021-06-16 DIAGNOSIS — M62830 Muscle spasm of back: Secondary | ICD-10-CM | POA: Diagnosis not present

## 2021-06-16 DIAGNOSIS — M6281 Muscle weakness (generalized): Secondary | ICD-10-CM | POA: Diagnosis not present

## 2021-06-16 DIAGNOSIS — R293 Abnormal posture: Secondary | ICD-10-CM | POA: Diagnosis not present

## 2021-06-17 DIAGNOSIS — M5136 Other intervertebral disc degeneration, lumbar region: Secondary | ICD-10-CM | POA: Diagnosis not present

## 2021-06-17 DIAGNOSIS — M545 Low back pain, unspecified: Secondary | ICD-10-CM | POA: Diagnosis not present

## 2021-06-17 DIAGNOSIS — R293 Abnormal posture: Secondary | ICD-10-CM | POA: Diagnosis not present

## 2021-06-17 DIAGNOSIS — M109 Gout, unspecified: Secondary | ICD-10-CM | POA: Diagnosis not present

## 2021-06-17 DIAGNOSIS — M6281 Muscle weakness (generalized): Secondary | ICD-10-CM | POA: Diagnosis not present

## 2021-06-17 DIAGNOSIS — I2699 Other pulmonary embolism without acute cor pulmonale: Secondary | ICD-10-CM | POA: Diagnosis not present

## 2021-06-17 DIAGNOSIS — M62830 Muscle spasm of back: Secondary | ICD-10-CM | POA: Diagnosis not present

## 2021-06-18 DIAGNOSIS — M5136 Other intervertebral disc degeneration, lumbar region: Secondary | ICD-10-CM | POA: Diagnosis not present

## 2021-06-18 DIAGNOSIS — M545 Low back pain, unspecified: Secondary | ICD-10-CM | POA: Diagnosis not present

## 2021-06-18 DIAGNOSIS — M109 Gout, unspecified: Secondary | ICD-10-CM | POA: Diagnosis not present

## 2021-06-18 DIAGNOSIS — M6281 Muscle weakness (generalized): Secondary | ICD-10-CM | POA: Diagnosis not present

## 2021-06-18 DIAGNOSIS — R293 Abnormal posture: Secondary | ICD-10-CM | POA: Diagnosis not present

## 2021-06-18 DIAGNOSIS — I2699 Other pulmonary embolism without acute cor pulmonale: Secondary | ICD-10-CM | POA: Diagnosis not present

## 2021-06-18 DIAGNOSIS — M62830 Muscle spasm of back: Secondary | ICD-10-CM | POA: Diagnosis not present

## 2021-06-20 DIAGNOSIS — M109 Gout, unspecified: Secondary | ICD-10-CM | POA: Diagnosis not present

## 2021-06-20 DIAGNOSIS — M545 Low back pain, unspecified: Secondary | ICD-10-CM | POA: Diagnosis not present

## 2021-06-20 DIAGNOSIS — M5136 Other intervertebral disc degeneration, lumbar region: Secondary | ICD-10-CM | POA: Diagnosis not present

## 2021-06-20 DIAGNOSIS — M6281 Muscle weakness (generalized): Secondary | ICD-10-CM | POA: Diagnosis not present

## 2021-06-20 DIAGNOSIS — R293 Abnormal posture: Secondary | ICD-10-CM | POA: Diagnosis not present

## 2021-06-20 DIAGNOSIS — I2699 Other pulmonary embolism without acute cor pulmonale: Secondary | ICD-10-CM | POA: Diagnosis not present

## 2021-06-20 DIAGNOSIS — M62830 Muscle spasm of back: Secondary | ICD-10-CM | POA: Diagnosis not present

## 2021-06-23 ENCOUNTER — Ambulatory Visit: Payer: Medicare HMO | Admitting: Nurse Practitioner

## 2021-06-23 DIAGNOSIS — F3342 Major depressive disorder, recurrent, in full remission: Secondary | ICD-10-CM | POA: Diagnosis not present

## 2021-06-29 DIAGNOSIS — D649 Anemia, unspecified: Secondary | ICD-10-CM | POA: Diagnosis not present

## 2021-06-29 DIAGNOSIS — I10 Essential (primary) hypertension: Secondary | ICD-10-CM | POA: Diagnosis not present

## 2021-06-30 DIAGNOSIS — L602 Onychogryphosis: Secondary | ICD-10-CM | POA: Diagnosis not present

## 2021-06-30 DIAGNOSIS — I739 Peripheral vascular disease, unspecified: Secondary | ICD-10-CM | POA: Diagnosis not present

## 2021-07-06 DIAGNOSIS — M6281 Muscle weakness (generalized): Secondary | ICD-10-CM | POA: Diagnosis not present

## 2021-07-06 DIAGNOSIS — R293 Abnormal posture: Secondary | ICD-10-CM | POA: Diagnosis not present

## 2021-07-06 DIAGNOSIS — M109 Gout, unspecified: Secondary | ICD-10-CM | POA: Diagnosis not present

## 2021-07-06 DIAGNOSIS — M62830 Muscle spasm of back: Secondary | ICD-10-CM | POA: Diagnosis not present

## 2021-07-06 DIAGNOSIS — I2699 Other pulmonary embolism without acute cor pulmonale: Secondary | ICD-10-CM | POA: Diagnosis not present

## 2021-07-06 DIAGNOSIS — M545 Low back pain, unspecified: Secondary | ICD-10-CM | POA: Diagnosis not present

## 2021-07-06 DIAGNOSIS — M5136 Other intervertebral disc degeneration, lumbar region: Secondary | ICD-10-CM | POA: Diagnosis not present

## 2021-07-07 DIAGNOSIS — M62830 Muscle spasm of back: Secondary | ICD-10-CM | POA: Diagnosis not present

## 2021-07-07 DIAGNOSIS — M6281 Muscle weakness (generalized): Secondary | ICD-10-CM | POA: Diagnosis not present

## 2021-07-07 DIAGNOSIS — M545 Low back pain, unspecified: Secondary | ICD-10-CM | POA: Diagnosis not present

## 2021-07-07 DIAGNOSIS — R293 Abnormal posture: Secondary | ICD-10-CM | POA: Diagnosis not present

## 2021-07-07 DIAGNOSIS — I2699 Other pulmonary embolism without acute cor pulmonale: Secondary | ICD-10-CM | POA: Diagnosis not present

## 2021-07-07 DIAGNOSIS — M5136 Other intervertebral disc degeneration, lumbar region: Secondary | ICD-10-CM | POA: Diagnosis not present

## 2021-07-07 DIAGNOSIS — M109 Gout, unspecified: Secondary | ICD-10-CM | POA: Diagnosis not present

## 2021-07-08 DIAGNOSIS — M545 Low back pain, unspecified: Secondary | ICD-10-CM | POA: Diagnosis not present

## 2021-07-08 DIAGNOSIS — R293 Abnormal posture: Secondary | ICD-10-CM | POA: Diagnosis not present

## 2021-07-08 DIAGNOSIS — M62830 Muscle spasm of back: Secondary | ICD-10-CM | POA: Diagnosis not present

## 2021-07-08 DIAGNOSIS — M5136 Other intervertebral disc degeneration, lumbar region: Secondary | ICD-10-CM | POA: Diagnosis not present

## 2021-07-08 DIAGNOSIS — I2699 Other pulmonary embolism without acute cor pulmonale: Secondary | ICD-10-CM | POA: Diagnosis not present

## 2021-07-08 DIAGNOSIS — M109 Gout, unspecified: Secondary | ICD-10-CM | POA: Diagnosis not present

## 2021-07-08 DIAGNOSIS — M6281 Muscle weakness (generalized): Secondary | ICD-10-CM | POA: Diagnosis not present

## 2021-07-11 DIAGNOSIS — M109 Gout, unspecified: Secondary | ICD-10-CM | POA: Diagnosis not present

## 2021-07-11 DIAGNOSIS — R293 Abnormal posture: Secondary | ICD-10-CM | POA: Diagnosis not present

## 2021-07-11 DIAGNOSIS — M62830 Muscle spasm of back: Secondary | ICD-10-CM | POA: Diagnosis not present

## 2021-07-11 DIAGNOSIS — M545 Low back pain, unspecified: Secondary | ICD-10-CM | POA: Diagnosis not present

## 2021-07-11 DIAGNOSIS — M5136 Other intervertebral disc degeneration, lumbar region: Secondary | ICD-10-CM | POA: Diagnosis not present

## 2021-07-11 DIAGNOSIS — I2699 Other pulmonary embolism without acute cor pulmonale: Secondary | ICD-10-CM | POA: Diagnosis not present

## 2021-07-11 DIAGNOSIS — M6281 Muscle weakness (generalized): Secondary | ICD-10-CM | POA: Diagnosis not present

## 2021-07-12 DIAGNOSIS — I2699 Other pulmonary embolism without acute cor pulmonale: Secondary | ICD-10-CM | POA: Diagnosis not present

## 2021-07-12 DIAGNOSIS — M545 Low back pain, unspecified: Secondary | ICD-10-CM | POA: Diagnosis not present

## 2021-07-12 DIAGNOSIS — R293 Abnormal posture: Secondary | ICD-10-CM | POA: Diagnosis not present

## 2021-07-12 DIAGNOSIS — M62830 Muscle spasm of back: Secondary | ICD-10-CM | POA: Diagnosis not present

## 2021-07-12 DIAGNOSIS — M5136 Other intervertebral disc degeneration, lumbar region: Secondary | ICD-10-CM | POA: Diagnosis not present

## 2021-07-12 DIAGNOSIS — M109 Gout, unspecified: Secondary | ICD-10-CM | POA: Diagnosis not present

## 2021-07-12 DIAGNOSIS — M6281 Muscle weakness (generalized): Secondary | ICD-10-CM | POA: Diagnosis not present

## 2021-07-13 DIAGNOSIS — M545 Low back pain, unspecified: Secondary | ICD-10-CM | POA: Diagnosis not present

## 2021-07-13 DIAGNOSIS — M62830 Muscle spasm of back: Secondary | ICD-10-CM | POA: Diagnosis not present

## 2021-07-13 DIAGNOSIS — I2699 Other pulmonary embolism without acute cor pulmonale: Secondary | ICD-10-CM | POA: Diagnosis not present

## 2021-07-13 DIAGNOSIS — M5136 Other intervertebral disc degeneration, lumbar region: Secondary | ICD-10-CM | POA: Diagnosis not present

## 2021-07-13 DIAGNOSIS — R293 Abnormal posture: Secondary | ICD-10-CM | POA: Diagnosis not present

## 2021-07-13 DIAGNOSIS — M6281 Muscle weakness (generalized): Secondary | ICD-10-CM | POA: Diagnosis not present

## 2021-07-13 DIAGNOSIS — M109 Gout, unspecified: Secondary | ICD-10-CM | POA: Diagnosis not present

## 2021-07-14 DIAGNOSIS — M545 Low back pain, unspecified: Secondary | ICD-10-CM | POA: Diagnosis not present

## 2021-07-14 DIAGNOSIS — M109 Gout, unspecified: Secondary | ICD-10-CM | POA: Diagnosis not present

## 2021-07-14 DIAGNOSIS — R293 Abnormal posture: Secondary | ICD-10-CM | POA: Diagnosis not present

## 2021-07-14 DIAGNOSIS — I2699 Other pulmonary embolism without acute cor pulmonale: Secondary | ICD-10-CM | POA: Diagnosis not present

## 2021-07-14 DIAGNOSIS — M6281 Muscle weakness (generalized): Secondary | ICD-10-CM | POA: Diagnosis not present

## 2021-07-14 DIAGNOSIS — M5136 Other intervertebral disc degeneration, lumbar region: Secondary | ICD-10-CM | POA: Diagnosis not present

## 2021-07-14 DIAGNOSIS — M62830 Muscle spasm of back: Secondary | ICD-10-CM | POA: Diagnosis not present

## 2021-07-15 DIAGNOSIS — M62830 Muscle spasm of back: Secondary | ICD-10-CM | POA: Diagnosis not present

## 2021-07-15 DIAGNOSIS — M6281 Muscle weakness (generalized): Secondary | ICD-10-CM | POA: Diagnosis not present

## 2021-07-15 DIAGNOSIS — M545 Low back pain, unspecified: Secondary | ICD-10-CM | POA: Diagnosis not present

## 2021-07-15 DIAGNOSIS — I2699 Other pulmonary embolism without acute cor pulmonale: Secondary | ICD-10-CM | POA: Diagnosis not present

## 2021-07-15 DIAGNOSIS — M5136 Other intervertebral disc degeneration, lumbar region: Secondary | ICD-10-CM | POA: Diagnosis not present

## 2021-07-15 DIAGNOSIS — M109 Gout, unspecified: Secondary | ICD-10-CM | POA: Diagnosis not present

## 2021-07-15 DIAGNOSIS — R293 Abnormal posture: Secondary | ICD-10-CM | POA: Diagnosis not present

## 2021-07-18 DIAGNOSIS — M5136 Other intervertebral disc degeneration, lumbar region: Secondary | ICD-10-CM | POA: Diagnosis not present

## 2021-07-18 DIAGNOSIS — M6281 Muscle weakness (generalized): Secondary | ICD-10-CM | POA: Diagnosis not present

## 2021-07-18 DIAGNOSIS — M109 Gout, unspecified: Secondary | ICD-10-CM | POA: Diagnosis not present

## 2021-07-18 DIAGNOSIS — I2699 Other pulmonary embolism without acute cor pulmonale: Secondary | ICD-10-CM | POA: Diagnosis not present

## 2021-07-18 DIAGNOSIS — M545 Low back pain, unspecified: Secondary | ICD-10-CM | POA: Diagnosis not present

## 2021-07-18 DIAGNOSIS — M62830 Muscle spasm of back: Secondary | ICD-10-CM | POA: Diagnosis not present

## 2021-07-18 DIAGNOSIS — R293 Abnormal posture: Secondary | ICD-10-CM | POA: Diagnosis not present

## 2021-07-19 DIAGNOSIS — M109 Gout, unspecified: Secondary | ICD-10-CM | POA: Diagnosis not present

## 2021-07-19 DIAGNOSIS — R293 Abnormal posture: Secondary | ICD-10-CM | POA: Diagnosis not present

## 2021-07-19 DIAGNOSIS — M6281 Muscle weakness (generalized): Secondary | ICD-10-CM | POA: Diagnosis not present

## 2021-07-19 DIAGNOSIS — M62830 Muscle spasm of back: Secondary | ICD-10-CM | POA: Diagnosis not present

## 2021-07-19 DIAGNOSIS — I2699 Other pulmonary embolism without acute cor pulmonale: Secondary | ICD-10-CM | POA: Diagnosis not present

## 2021-07-19 DIAGNOSIS — M545 Low back pain, unspecified: Secondary | ICD-10-CM | POA: Diagnosis not present

## 2021-07-19 DIAGNOSIS — M5136 Other intervertebral disc degeneration, lumbar region: Secondary | ICD-10-CM | POA: Diagnosis not present

## 2021-07-20 DIAGNOSIS — M62830 Muscle spasm of back: Secondary | ICD-10-CM | POA: Diagnosis not present

## 2021-07-20 DIAGNOSIS — M5136 Other intervertebral disc degeneration, lumbar region: Secondary | ICD-10-CM | POA: Diagnosis not present

## 2021-07-20 DIAGNOSIS — R293 Abnormal posture: Secondary | ICD-10-CM | POA: Diagnosis not present

## 2021-07-20 DIAGNOSIS — M6281 Muscle weakness (generalized): Secondary | ICD-10-CM | POA: Diagnosis not present

## 2021-07-20 DIAGNOSIS — M545 Low back pain, unspecified: Secondary | ICD-10-CM | POA: Diagnosis not present

## 2021-07-20 DIAGNOSIS — M109 Gout, unspecified: Secondary | ICD-10-CM | POA: Diagnosis not present

## 2021-07-20 DIAGNOSIS — I2699 Other pulmonary embolism without acute cor pulmonale: Secondary | ICD-10-CM | POA: Diagnosis not present

## 2021-07-21 DIAGNOSIS — I2699 Other pulmonary embolism without acute cor pulmonale: Secondary | ICD-10-CM | POA: Diagnosis not present

## 2021-07-21 DIAGNOSIS — M6281 Muscle weakness (generalized): Secondary | ICD-10-CM | POA: Diagnosis not present

## 2021-07-21 DIAGNOSIS — M62830 Muscle spasm of back: Secondary | ICD-10-CM | POA: Diagnosis not present

## 2021-07-21 DIAGNOSIS — R293 Abnormal posture: Secondary | ICD-10-CM | POA: Diagnosis not present

## 2021-07-21 DIAGNOSIS — M545 Low back pain, unspecified: Secondary | ICD-10-CM | POA: Diagnosis not present

## 2021-07-21 DIAGNOSIS — M109 Gout, unspecified: Secondary | ICD-10-CM | POA: Diagnosis not present

## 2021-07-21 DIAGNOSIS — M5136 Other intervertebral disc degeneration, lumbar region: Secondary | ICD-10-CM | POA: Diagnosis not present

## 2021-07-22 DIAGNOSIS — I2699 Other pulmonary embolism without acute cor pulmonale: Secondary | ICD-10-CM | POA: Diagnosis not present

## 2021-07-22 DIAGNOSIS — M62830 Muscle spasm of back: Secondary | ICD-10-CM | POA: Diagnosis not present

## 2021-07-22 DIAGNOSIS — M5136 Other intervertebral disc degeneration, lumbar region: Secondary | ICD-10-CM | POA: Diagnosis not present

## 2021-07-22 DIAGNOSIS — M6281 Muscle weakness (generalized): Secondary | ICD-10-CM | POA: Diagnosis not present

## 2021-07-22 DIAGNOSIS — R293 Abnormal posture: Secondary | ICD-10-CM | POA: Diagnosis not present

## 2021-07-22 DIAGNOSIS — M109 Gout, unspecified: Secondary | ICD-10-CM | POA: Diagnosis not present

## 2021-07-22 DIAGNOSIS — M545 Low back pain, unspecified: Secondary | ICD-10-CM | POA: Diagnosis not present

## 2021-07-25 DIAGNOSIS — R293 Abnormal posture: Secondary | ICD-10-CM | POA: Diagnosis not present

## 2021-07-25 DIAGNOSIS — M109 Gout, unspecified: Secondary | ICD-10-CM | POA: Diagnosis not present

## 2021-07-25 DIAGNOSIS — I2699 Other pulmonary embolism without acute cor pulmonale: Secondary | ICD-10-CM | POA: Diagnosis not present

## 2021-07-25 DIAGNOSIS — M5136 Other intervertebral disc degeneration, lumbar region: Secondary | ICD-10-CM | POA: Diagnosis not present

## 2021-07-25 DIAGNOSIS — M62830 Muscle spasm of back: Secondary | ICD-10-CM | POA: Diagnosis not present

## 2021-07-25 DIAGNOSIS — M6281 Muscle weakness (generalized): Secondary | ICD-10-CM | POA: Diagnosis not present

## 2021-07-25 DIAGNOSIS — M545 Low back pain, unspecified: Secondary | ICD-10-CM | POA: Diagnosis not present

## 2021-07-26 DIAGNOSIS — M109 Gout, unspecified: Secondary | ICD-10-CM | POA: Diagnosis not present

## 2021-07-26 DIAGNOSIS — F3342 Major depressive disorder, recurrent, in full remission: Secondary | ICD-10-CM | POA: Diagnosis not present

## 2021-07-26 DIAGNOSIS — M545 Low back pain, unspecified: Secondary | ICD-10-CM | POA: Diagnosis not present

## 2021-07-26 DIAGNOSIS — M62830 Muscle spasm of back: Secondary | ICD-10-CM | POA: Diagnosis not present

## 2021-07-26 DIAGNOSIS — M5136 Other intervertebral disc degeneration, lumbar region: Secondary | ICD-10-CM | POA: Diagnosis not present

## 2021-07-26 DIAGNOSIS — R293 Abnormal posture: Secondary | ICD-10-CM | POA: Diagnosis not present

## 2021-07-26 DIAGNOSIS — M6281 Muscle weakness (generalized): Secondary | ICD-10-CM | POA: Diagnosis not present

## 2021-07-26 DIAGNOSIS — I2699 Other pulmonary embolism without acute cor pulmonale: Secondary | ICD-10-CM | POA: Diagnosis not present

## 2021-07-27 DIAGNOSIS — M6281 Muscle weakness (generalized): Secondary | ICD-10-CM | POA: Diagnosis not present

## 2021-07-27 DIAGNOSIS — Z789 Other specified health status: Secondary | ICD-10-CM | POA: Diagnosis not present

## 2021-07-27 DIAGNOSIS — D696 Thrombocytopenia, unspecified: Secondary | ICD-10-CM | POA: Diagnosis not present

## 2021-07-27 DIAGNOSIS — M62830 Muscle spasm of back: Secondary | ICD-10-CM | POA: Diagnosis not present

## 2021-07-27 DIAGNOSIS — R293 Abnormal posture: Secondary | ICD-10-CM | POA: Diagnosis not present

## 2021-07-27 DIAGNOSIS — M545 Low back pain, unspecified: Secondary | ICD-10-CM | POA: Diagnosis not present

## 2021-07-27 DIAGNOSIS — M109 Gout, unspecified: Secondary | ICD-10-CM | POA: Diagnosis not present

## 2021-07-27 DIAGNOSIS — I2699 Other pulmonary embolism without acute cor pulmonale: Secondary | ICD-10-CM | POA: Diagnosis not present

## 2021-07-27 DIAGNOSIS — L301 Dyshidrosis [pompholyx]: Secondary | ICD-10-CM | POA: Diagnosis not present

## 2021-07-27 DIAGNOSIS — M5136 Other intervertebral disc degeneration, lumbar region: Secondary | ICD-10-CM | POA: Diagnosis not present

## 2021-07-28 DIAGNOSIS — M5136 Other intervertebral disc degeneration, lumbar region: Secondary | ICD-10-CM | POA: Diagnosis not present

## 2021-07-28 DIAGNOSIS — M6281 Muscle weakness (generalized): Secondary | ICD-10-CM | POA: Diagnosis not present

## 2021-07-28 DIAGNOSIS — M109 Gout, unspecified: Secondary | ICD-10-CM | POA: Diagnosis not present

## 2021-07-28 DIAGNOSIS — R293 Abnormal posture: Secondary | ICD-10-CM | POA: Diagnosis not present

## 2021-07-28 DIAGNOSIS — M545 Low back pain, unspecified: Secondary | ICD-10-CM | POA: Diagnosis not present

## 2021-07-28 DIAGNOSIS — I2699 Other pulmonary embolism without acute cor pulmonale: Secondary | ICD-10-CM | POA: Diagnosis not present

## 2021-07-28 DIAGNOSIS — M62830 Muscle spasm of back: Secondary | ICD-10-CM | POA: Diagnosis not present

## 2021-07-29 DIAGNOSIS — M5136 Other intervertebral disc degeneration, lumbar region: Secondary | ICD-10-CM | POA: Diagnosis not present

## 2021-07-29 DIAGNOSIS — R293 Abnormal posture: Secondary | ICD-10-CM | POA: Diagnosis not present

## 2021-07-29 DIAGNOSIS — M62830 Muscle spasm of back: Secondary | ICD-10-CM | POA: Diagnosis not present

## 2021-07-29 DIAGNOSIS — M545 Low back pain, unspecified: Secondary | ICD-10-CM | POA: Diagnosis not present

## 2021-07-29 DIAGNOSIS — M109 Gout, unspecified: Secondary | ICD-10-CM | POA: Diagnosis not present

## 2021-07-29 DIAGNOSIS — I2699 Other pulmonary embolism without acute cor pulmonale: Secondary | ICD-10-CM | POA: Diagnosis not present

## 2021-07-29 DIAGNOSIS — M6281 Muscle weakness (generalized): Secondary | ICD-10-CM | POA: Diagnosis not present

## 2021-08-01 DIAGNOSIS — M109 Gout, unspecified: Secondary | ICD-10-CM | POA: Diagnosis not present

## 2021-08-01 DIAGNOSIS — I2699 Other pulmonary embolism without acute cor pulmonale: Secondary | ICD-10-CM | POA: Diagnosis not present

## 2021-08-01 DIAGNOSIS — M6281 Muscle weakness (generalized): Secondary | ICD-10-CM | POA: Diagnosis not present

## 2021-08-01 DIAGNOSIS — R293 Abnormal posture: Secondary | ICD-10-CM | POA: Diagnosis not present

## 2021-08-01 DIAGNOSIS — M545 Low back pain, unspecified: Secondary | ICD-10-CM | POA: Diagnosis not present

## 2021-08-01 DIAGNOSIS — M62830 Muscle spasm of back: Secondary | ICD-10-CM | POA: Diagnosis not present

## 2021-08-01 DIAGNOSIS — M5136 Other intervertebral disc degeneration, lumbar region: Secondary | ICD-10-CM | POA: Diagnosis not present

## 2021-08-02 DIAGNOSIS — M6281 Muscle weakness (generalized): Secondary | ICD-10-CM | POA: Diagnosis not present

## 2021-08-02 DIAGNOSIS — M5136 Other intervertebral disc degeneration, lumbar region: Secondary | ICD-10-CM | POA: Diagnosis not present

## 2021-08-02 DIAGNOSIS — H43813 Vitreous degeneration, bilateral: Secondary | ICD-10-CM | POA: Diagnosis not present

## 2021-08-02 DIAGNOSIS — M545 Low back pain, unspecified: Secondary | ICD-10-CM | POA: Diagnosis not present

## 2021-08-02 DIAGNOSIS — H2513 Age-related nuclear cataract, bilateral: Secondary | ICD-10-CM | POA: Diagnosis not present

## 2021-08-02 DIAGNOSIS — M62830 Muscle spasm of back: Secondary | ICD-10-CM | POA: Diagnosis not present

## 2021-08-02 DIAGNOSIS — I2699 Other pulmonary embolism without acute cor pulmonale: Secondary | ICD-10-CM | POA: Diagnosis not present

## 2021-08-02 DIAGNOSIS — H524 Presbyopia: Secondary | ICD-10-CM | POA: Diagnosis not present

## 2021-08-02 DIAGNOSIS — M109 Gout, unspecified: Secondary | ICD-10-CM | POA: Diagnosis not present

## 2021-08-02 DIAGNOSIS — R293 Abnormal posture: Secondary | ICD-10-CM | POA: Diagnosis not present

## 2021-08-04 DIAGNOSIS — M62830 Muscle spasm of back: Secondary | ICD-10-CM | POA: Diagnosis not present

## 2021-08-04 DIAGNOSIS — M545 Low back pain, unspecified: Secondary | ICD-10-CM | POA: Diagnosis not present

## 2021-08-04 DIAGNOSIS — M5136 Other intervertebral disc degeneration, lumbar region: Secondary | ICD-10-CM | POA: Diagnosis not present

## 2021-08-04 DIAGNOSIS — M6281 Muscle weakness (generalized): Secondary | ICD-10-CM | POA: Diagnosis not present

## 2021-08-04 DIAGNOSIS — I2699 Other pulmonary embolism without acute cor pulmonale: Secondary | ICD-10-CM | POA: Diagnosis not present

## 2021-08-04 DIAGNOSIS — F3342 Major depressive disorder, recurrent, in full remission: Secondary | ICD-10-CM | POA: Diagnosis not present

## 2021-08-04 DIAGNOSIS — R293 Abnormal posture: Secondary | ICD-10-CM | POA: Diagnosis not present

## 2021-08-04 DIAGNOSIS — M109 Gout, unspecified: Secondary | ICD-10-CM | POA: Diagnosis not present

## 2021-08-05 DIAGNOSIS — M545 Low back pain, unspecified: Secondary | ICD-10-CM | POA: Diagnosis not present

## 2021-08-05 DIAGNOSIS — R293 Abnormal posture: Secondary | ICD-10-CM | POA: Diagnosis not present

## 2021-08-05 DIAGNOSIS — M109 Gout, unspecified: Secondary | ICD-10-CM | POA: Diagnosis not present

## 2021-08-05 DIAGNOSIS — I2699 Other pulmonary embolism without acute cor pulmonale: Secondary | ICD-10-CM | POA: Diagnosis not present

## 2021-08-05 DIAGNOSIS — M5136 Other intervertebral disc degeneration, lumbar region: Secondary | ICD-10-CM | POA: Diagnosis not present

## 2021-08-05 DIAGNOSIS — M62830 Muscle spasm of back: Secondary | ICD-10-CM | POA: Diagnosis not present

## 2021-08-05 DIAGNOSIS — M6281 Muscle weakness (generalized): Secondary | ICD-10-CM | POA: Diagnosis not present

## 2021-08-06 DIAGNOSIS — M5136 Other intervertebral disc degeneration, lumbar region: Secondary | ICD-10-CM | POA: Diagnosis not present

## 2021-08-06 DIAGNOSIS — I2699 Other pulmonary embolism without acute cor pulmonale: Secondary | ICD-10-CM | POA: Diagnosis not present

## 2021-08-06 DIAGNOSIS — M545 Low back pain, unspecified: Secondary | ICD-10-CM | POA: Diagnosis not present

## 2021-08-06 DIAGNOSIS — M6281 Muscle weakness (generalized): Secondary | ICD-10-CM | POA: Diagnosis not present

## 2021-08-06 DIAGNOSIS — M62830 Muscle spasm of back: Secondary | ICD-10-CM | POA: Diagnosis not present

## 2021-08-06 DIAGNOSIS — M109 Gout, unspecified: Secondary | ICD-10-CM | POA: Diagnosis not present

## 2021-08-06 DIAGNOSIS — R293 Abnormal posture: Secondary | ICD-10-CM | POA: Diagnosis not present

## 2021-08-08 DIAGNOSIS — M6281 Muscle weakness (generalized): Secondary | ICD-10-CM | POA: Diagnosis not present

## 2021-08-08 DIAGNOSIS — R293 Abnormal posture: Secondary | ICD-10-CM | POA: Diagnosis not present

## 2021-08-08 DIAGNOSIS — M109 Gout, unspecified: Secondary | ICD-10-CM | POA: Diagnosis not present

## 2021-08-08 DIAGNOSIS — M5136 Other intervertebral disc degeneration, lumbar region: Secondary | ICD-10-CM | POA: Diagnosis not present

## 2021-08-08 DIAGNOSIS — I2699 Other pulmonary embolism without acute cor pulmonale: Secondary | ICD-10-CM | POA: Diagnosis not present

## 2021-08-08 DIAGNOSIS — M62830 Muscle spasm of back: Secondary | ICD-10-CM | POA: Diagnosis not present

## 2021-08-08 DIAGNOSIS — M545 Low back pain, unspecified: Secondary | ICD-10-CM | POA: Diagnosis not present

## 2021-08-10 DIAGNOSIS — M109 Gout, unspecified: Secondary | ICD-10-CM | POA: Diagnosis not present

## 2021-08-10 DIAGNOSIS — M545 Low back pain, unspecified: Secondary | ICD-10-CM | POA: Diagnosis not present

## 2021-08-10 DIAGNOSIS — M62830 Muscle spasm of back: Secondary | ICD-10-CM | POA: Diagnosis not present

## 2021-08-10 DIAGNOSIS — R293 Abnormal posture: Secondary | ICD-10-CM | POA: Diagnosis not present

## 2021-08-10 DIAGNOSIS — M5136 Other intervertebral disc degeneration, lumbar region: Secondary | ICD-10-CM | POA: Diagnosis not present

## 2021-08-10 DIAGNOSIS — I2699 Other pulmonary embolism without acute cor pulmonale: Secondary | ICD-10-CM | POA: Diagnosis not present

## 2021-08-10 DIAGNOSIS — M6281 Muscle weakness (generalized): Secondary | ICD-10-CM | POA: Diagnosis not present

## 2021-08-11 DIAGNOSIS — M109 Gout, unspecified: Secondary | ICD-10-CM | POA: Diagnosis not present

## 2021-08-11 DIAGNOSIS — R293 Abnormal posture: Secondary | ICD-10-CM | POA: Diagnosis not present

## 2021-08-11 DIAGNOSIS — M62830 Muscle spasm of back: Secondary | ICD-10-CM | POA: Diagnosis not present

## 2021-08-11 DIAGNOSIS — M545 Low back pain, unspecified: Secondary | ICD-10-CM | POA: Diagnosis not present

## 2021-08-11 DIAGNOSIS — M5136 Other intervertebral disc degeneration, lumbar region: Secondary | ICD-10-CM | POA: Diagnosis not present

## 2021-08-11 DIAGNOSIS — M6281 Muscle weakness (generalized): Secondary | ICD-10-CM | POA: Diagnosis not present

## 2021-08-11 DIAGNOSIS — I2699 Other pulmonary embolism without acute cor pulmonale: Secondary | ICD-10-CM | POA: Diagnosis not present

## 2021-08-15 DIAGNOSIS — M62830 Muscle spasm of back: Secondary | ICD-10-CM | POA: Diagnosis not present

## 2021-08-15 DIAGNOSIS — M545 Low back pain, unspecified: Secondary | ICD-10-CM | POA: Diagnosis not present

## 2021-08-15 DIAGNOSIS — M5136 Other intervertebral disc degeneration, lumbar region: Secondary | ICD-10-CM | POA: Diagnosis not present

## 2021-08-15 DIAGNOSIS — I2699 Other pulmonary embolism without acute cor pulmonale: Secondary | ICD-10-CM | POA: Diagnosis not present

## 2021-08-15 DIAGNOSIS — M109 Gout, unspecified: Secondary | ICD-10-CM | POA: Diagnosis not present

## 2021-08-15 DIAGNOSIS — M6281 Muscle weakness (generalized): Secondary | ICD-10-CM | POA: Diagnosis not present

## 2021-08-15 DIAGNOSIS — R293 Abnormal posture: Secondary | ICD-10-CM | POA: Diagnosis not present

## 2021-08-16 DIAGNOSIS — I2699 Other pulmonary embolism without acute cor pulmonale: Secondary | ICD-10-CM | POA: Diagnosis not present

## 2021-08-16 DIAGNOSIS — M62830 Muscle spasm of back: Secondary | ICD-10-CM | POA: Diagnosis not present

## 2021-08-16 DIAGNOSIS — M545 Low back pain, unspecified: Secondary | ICD-10-CM | POA: Diagnosis not present

## 2021-08-16 DIAGNOSIS — M109 Gout, unspecified: Secondary | ICD-10-CM | POA: Diagnosis not present

## 2021-08-16 DIAGNOSIS — M5136 Other intervertebral disc degeneration, lumbar region: Secondary | ICD-10-CM | POA: Diagnosis not present

## 2021-08-16 DIAGNOSIS — R293 Abnormal posture: Secondary | ICD-10-CM | POA: Diagnosis not present

## 2021-08-16 DIAGNOSIS — M6281 Muscle weakness (generalized): Secondary | ICD-10-CM | POA: Diagnosis not present

## 2021-08-17 DIAGNOSIS — M5136 Other intervertebral disc degeneration, lumbar region: Secondary | ICD-10-CM | POA: Diagnosis not present

## 2021-08-17 DIAGNOSIS — I2699 Other pulmonary embolism without acute cor pulmonale: Secondary | ICD-10-CM | POA: Diagnosis not present

## 2021-08-17 DIAGNOSIS — M545 Low back pain, unspecified: Secondary | ICD-10-CM | POA: Diagnosis not present

## 2021-08-17 DIAGNOSIS — M109 Gout, unspecified: Secondary | ICD-10-CM | POA: Diagnosis not present

## 2021-08-17 DIAGNOSIS — R293 Abnormal posture: Secondary | ICD-10-CM | POA: Diagnosis not present

## 2021-08-17 DIAGNOSIS — M62830 Muscle spasm of back: Secondary | ICD-10-CM | POA: Diagnosis not present

## 2021-08-17 DIAGNOSIS — M6281 Muscle weakness (generalized): Secondary | ICD-10-CM | POA: Diagnosis not present

## 2021-08-22 DIAGNOSIS — I2699 Other pulmonary embolism without acute cor pulmonale: Secondary | ICD-10-CM | POA: Diagnosis not present

## 2021-08-22 DIAGNOSIS — R293 Abnormal posture: Secondary | ICD-10-CM | POA: Diagnosis not present

## 2021-08-22 DIAGNOSIS — M6281 Muscle weakness (generalized): Secondary | ICD-10-CM | POA: Diagnosis not present

## 2021-08-22 DIAGNOSIS — M545 Low back pain, unspecified: Secondary | ICD-10-CM | POA: Diagnosis not present

## 2021-08-22 DIAGNOSIS — L853 Xerosis cutis: Secondary | ICD-10-CM | POA: Diagnosis not present

## 2021-08-22 DIAGNOSIS — M5136 Other intervertebral disc degeneration, lumbar region: Secondary | ICD-10-CM | POA: Diagnosis not present

## 2021-08-22 DIAGNOSIS — M109 Gout, unspecified: Secondary | ICD-10-CM | POA: Diagnosis not present

## 2021-08-22 DIAGNOSIS — M62830 Muscle spasm of back: Secondary | ICD-10-CM | POA: Diagnosis not present

## 2021-08-22 DIAGNOSIS — L219 Seborrheic dermatitis, unspecified: Secondary | ICD-10-CM | POA: Diagnosis not present

## 2021-08-23 DIAGNOSIS — F3342 Major depressive disorder, recurrent, in full remission: Secondary | ICD-10-CM | POA: Diagnosis not present

## 2021-08-24 DIAGNOSIS — E785 Hyperlipidemia, unspecified: Secondary | ICD-10-CM | POA: Diagnosis not present

## 2021-08-24 DIAGNOSIS — I10 Essential (primary) hypertension: Secondary | ICD-10-CM | POA: Diagnosis not present

## 2021-09-08 DIAGNOSIS — F3342 Major depressive disorder, recurrent, in full remission: Secondary | ICD-10-CM | POA: Diagnosis not present

## 2021-09-22 DIAGNOSIS — F3342 Major depressive disorder, recurrent, in full remission: Secondary | ICD-10-CM | POA: Diagnosis not present

## 2021-10-03 DIAGNOSIS — I2699 Other pulmonary embolism without acute cor pulmonale: Secondary | ICD-10-CM | POA: Diagnosis not present

## 2021-10-03 DIAGNOSIS — M109 Gout, unspecified: Secondary | ICD-10-CM | POA: Diagnosis not present

## 2021-10-03 DIAGNOSIS — M545 Low back pain, unspecified: Secondary | ICD-10-CM | POA: Diagnosis not present

## 2021-10-03 DIAGNOSIS — I110 Hypertensive heart disease with heart failure: Secondary | ICD-10-CM | POA: Diagnosis not present

## 2021-10-03 DIAGNOSIS — R293 Abnormal posture: Secondary | ICD-10-CM | POA: Diagnosis not present

## 2021-10-03 DIAGNOSIS — D6859 Other primary thrombophilia: Secondary | ICD-10-CM | POA: Diagnosis not present

## 2021-10-03 DIAGNOSIS — I129 Hypertensive chronic kidney disease with stage 1 through stage 4 chronic kidney disease, or unspecified chronic kidney disease: Secondary | ICD-10-CM | POA: Diagnosis not present

## 2021-10-03 DIAGNOSIS — M1990 Unspecified osteoarthritis, unspecified site: Secondary | ICD-10-CM | POA: Diagnosis not present

## 2021-10-03 DIAGNOSIS — I679 Cerebrovascular disease, unspecified: Secondary | ICD-10-CM | POA: Diagnosis not present

## 2021-10-03 DIAGNOSIS — F0390 Unspecified dementia without behavioral disturbance: Secondary | ICD-10-CM | POA: Diagnosis not present

## 2021-10-03 DIAGNOSIS — R635 Abnormal weight gain: Secondary | ICD-10-CM | POA: Diagnosis not present

## 2021-10-03 DIAGNOSIS — M6281 Muscle weakness (generalized): Secondary | ICD-10-CM | POA: Diagnosis not present

## 2021-10-03 DIAGNOSIS — N1831 Chronic kidney disease, stage 3a: Secondary | ICD-10-CM | POA: Diagnosis not present

## 2021-10-03 DIAGNOSIS — M62830 Muscle spasm of back: Secondary | ICD-10-CM | POA: Diagnosis not present

## 2021-10-03 DIAGNOSIS — Z7409 Other reduced mobility: Secondary | ICD-10-CM | POA: Diagnosis not present

## 2021-10-03 DIAGNOSIS — M5136 Other intervertebral disc degeneration, lumbar region: Secondary | ICD-10-CM | POA: Diagnosis not present

## 2021-10-04 DIAGNOSIS — M545 Low back pain, unspecified: Secondary | ICD-10-CM | POA: Diagnosis not present

## 2021-10-04 DIAGNOSIS — M6281 Muscle weakness (generalized): Secondary | ICD-10-CM | POA: Diagnosis not present

## 2021-10-04 DIAGNOSIS — M62830 Muscle spasm of back: Secondary | ICD-10-CM | POA: Diagnosis not present

## 2021-10-04 DIAGNOSIS — I2699 Other pulmonary embolism without acute cor pulmonale: Secondary | ICD-10-CM | POA: Diagnosis not present

## 2021-10-04 DIAGNOSIS — M5136 Other intervertebral disc degeneration, lumbar region: Secondary | ICD-10-CM | POA: Diagnosis not present

## 2021-10-04 DIAGNOSIS — R293 Abnormal posture: Secondary | ICD-10-CM | POA: Diagnosis not present

## 2021-10-04 DIAGNOSIS — M109 Gout, unspecified: Secondary | ICD-10-CM | POA: Diagnosis not present

## 2021-10-05 DIAGNOSIS — M6281 Muscle weakness (generalized): Secondary | ICD-10-CM | POA: Diagnosis not present

## 2021-10-05 DIAGNOSIS — M62830 Muscle spasm of back: Secondary | ICD-10-CM | POA: Diagnosis not present

## 2021-10-05 DIAGNOSIS — M5136 Other intervertebral disc degeneration, lumbar region: Secondary | ICD-10-CM | POA: Diagnosis not present

## 2021-10-05 DIAGNOSIS — I2699 Other pulmonary embolism without acute cor pulmonale: Secondary | ICD-10-CM | POA: Diagnosis not present

## 2021-10-05 DIAGNOSIS — R293 Abnormal posture: Secondary | ICD-10-CM | POA: Diagnosis not present

## 2021-10-05 DIAGNOSIS — M545 Low back pain, unspecified: Secondary | ICD-10-CM | POA: Diagnosis not present

## 2021-10-05 DIAGNOSIS — M109 Gout, unspecified: Secondary | ICD-10-CM | POA: Diagnosis not present

## 2021-10-06 DIAGNOSIS — R293 Abnormal posture: Secondary | ICD-10-CM | POA: Diagnosis not present

## 2021-10-06 DIAGNOSIS — M545 Low back pain, unspecified: Secondary | ICD-10-CM | POA: Diagnosis not present

## 2021-10-06 DIAGNOSIS — M62830 Muscle spasm of back: Secondary | ICD-10-CM | POA: Diagnosis not present

## 2021-10-06 DIAGNOSIS — I2699 Other pulmonary embolism without acute cor pulmonale: Secondary | ICD-10-CM | POA: Diagnosis not present

## 2021-10-06 DIAGNOSIS — I700 Atherosclerosis of aorta: Secondary | ICD-10-CM | POA: Diagnosis not present

## 2021-10-06 DIAGNOSIS — R935 Abnormal findings on diagnostic imaging of other abdominal regions, including retroperitoneum: Secondary | ICD-10-CM | POA: Diagnosis not present

## 2021-10-06 DIAGNOSIS — M5136 Other intervertebral disc degeneration, lumbar region: Secondary | ICD-10-CM | POA: Diagnosis not present

## 2021-10-06 DIAGNOSIS — I714 Abdominal aortic aneurysm, without rupture, unspecified: Secondary | ICD-10-CM | POA: Diagnosis not present

## 2021-10-06 DIAGNOSIS — M109 Gout, unspecified: Secondary | ICD-10-CM | POA: Diagnosis not present

## 2021-10-06 DIAGNOSIS — M6281 Muscle weakness (generalized): Secondary | ICD-10-CM | POA: Diagnosis not present

## 2021-10-06 DIAGNOSIS — F3342 Major depressive disorder, recurrent, in full remission: Secondary | ICD-10-CM | POA: Diagnosis not present

## 2021-10-07 DIAGNOSIS — M5136 Other intervertebral disc degeneration, lumbar region: Secondary | ICD-10-CM | POA: Diagnosis not present

## 2021-10-07 DIAGNOSIS — I2699 Other pulmonary embolism without acute cor pulmonale: Secondary | ICD-10-CM | POA: Diagnosis not present

## 2021-10-07 DIAGNOSIS — M109 Gout, unspecified: Secondary | ICD-10-CM | POA: Diagnosis not present

## 2021-10-07 DIAGNOSIS — M545 Low back pain, unspecified: Secondary | ICD-10-CM | POA: Diagnosis not present

## 2021-10-07 DIAGNOSIS — R293 Abnormal posture: Secondary | ICD-10-CM | POA: Diagnosis not present

## 2021-10-07 DIAGNOSIS — M62830 Muscle spasm of back: Secondary | ICD-10-CM | POA: Diagnosis not present

## 2021-10-07 DIAGNOSIS — M6281 Muscle weakness (generalized): Secondary | ICD-10-CM | POA: Diagnosis not present

## 2021-10-10 DIAGNOSIS — M5136 Other intervertebral disc degeneration, lumbar region: Secondary | ICD-10-CM | POA: Diagnosis not present

## 2021-10-10 DIAGNOSIS — R293 Abnormal posture: Secondary | ICD-10-CM | POA: Diagnosis not present

## 2021-10-10 DIAGNOSIS — M109 Gout, unspecified: Secondary | ICD-10-CM | POA: Diagnosis not present

## 2021-10-10 DIAGNOSIS — M545 Low back pain, unspecified: Secondary | ICD-10-CM | POA: Diagnosis not present

## 2021-10-10 DIAGNOSIS — I2699 Other pulmonary embolism without acute cor pulmonale: Secondary | ICD-10-CM | POA: Diagnosis not present

## 2021-10-10 DIAGNOSIS — M62830 Muscle spasm of back: Secondary | ICD-10-CM | POA: Diagnosis not present

## 2021-10-10 DIAGNOSIS — M6281 Muscle weakness (generalized): Secondary | ICD-10-CM | POA: Diagnosis not present

## 2021-10-11 DIAGNOSIS — M545 Low back pain, unspecified: Secondary | ICD-10-CM | POA: Diagnosis not present

## 2021-10-11 DIAGNOSIS — M5136 Other intervertebral disc degeneration, lumbar region: Secondary | ICD-10-CM | POA: Diagnosis not present

## 2021-10-11 DIAGNOSIS — M109 Gout, unspecified: Secondary | ICD-10-CM | POA: Diagnosis not present

## 2021-10-11 DIAGNOSIS — M6281 Muscle weakness (generalized): Secondary | ICD-10-CM | POA: Diagnosis not present

## 2021-10-11 DIAGNOSIS — R293 Abnormal posture: Secondary | ICD-10-CM | POA: Diagnosis not present

## 2021-10-11 DIAGNOSIS — I2699 Other pulmonary embolism without acute cor pulmonale: Secondary | ICD-10-CM | POA: Diagnosis not present

## 2021-10-11 DIAGNOSIS — M62830 Muscle spasm of back: Secondary | ICD-10-CM | POA: Diagnosis not present

## 2021-10-12 DIAGNOSIS — M109 Gout, unspecified: Secondary | ICD-10-CM | POA: Diagnosis not present

## 2021-10-12 DIAGNOSIS — I2699 Other pulmonary embolism without acute cor pulmonale: Secondary | ICD-10-CM | POA: Diagnosis not present

## 2021-10-12 DIAGNOSIS — R293 Abnormal posture: Secondary | ICD-10-CM | POA: Diagnosis not present

## 2021-10-12 DIAGNOSIS — M545 Low back pain, unspecified: Secondary | ICD-10-CM | POA: Diagnosis not present

## 2021-10-12 DIAGNOSIS — M6281 Muscle weakness (generalized): Secondary | ICD-10-CM | POA: Diagnosis not present

## 2021-10-12 DIAGNOSIS — M62830 Muscle spasm of back: Secondary | ICD-10-CM | POA: Diagnosis not present

## 2021-10-12 DIAGNOSIS — M5136 Other intervertebral disc degeneration, lumbar region: Secondary | ICD-10-CM | POA: Diagnosis not present

## 2021-10-13 DIAGNOSIS — M62830 Muscle spasm of back: Secondary | ICD-10-CM | POA: Diagnosis not present

## 2021-10-13 DIAGNOSIS — M545 Low back pain, unspecified: Secondary | ICD-10-CM | POA: Diagnosis not present

## 2021-10-13 DIAGNOSIS — M5136 Other intervertebral disc degeneration, lumbar region: Secondary | ICD-10-CM | POA: Diagnosis not present

## 2021-10-13 DIAGNOSIS — I2699 Other pulmonary embolism without acute cor pulmonale: Secondary | ICD-10-CM | POA: Diagnosis not present

## 2021-10-13 DIAGNOSIS — M109 Gout, unspecified: Secondary | ICD-10-CM | POA: Diagnosis not present

## 2021-10-13 DIAGNOSIS — M6281 Muscle weakness (generalized): Secondary | ICD-10-CM | POA: Diagnosis not present

## 2021-10-13 DIAGNOSIS — R293 Abnormal posture: Secondary | ICD-10-CM | POA: Diagnosis not present

## 2021-10-14 DIAGNOSIS — M6281 Muscle weakness (generalized): Secondary | ICD-10-CM | POA: Diagnosis not present

## 2021-10-14 DIAGNOSIS — R293 Abnormal posture: Secondary | ICD-10-CM | POA: Diagnosis not present

## 2021-10-14 DIAGNOSIS — M109 Gout, unspecified: Secondary | ICD-10-CM | POA: Diagnosis not present

## 2021-10-14 DIAGNOSIS — M62830 Muscle spasm of back: Secondary | ICD-10-CM | POA: Diagnosis not present

## 2021-10-14 DIAGNOSIS — M545 Low back pain, unspecified: Secondary | ICD-10-CM | POA: Diagnosis not present

## 2021-10-14 DIAGNOSIS — I2699 Other pulmonary embolism without acute cor pulmonale: Secondary | ICD-10-CM | POA: Diagnosis not present

## 2021-10-14 DIAGNOSIS — M5136 Other intervertebral disc degeneration, lumbar region: Secondary | ICD-10-CM | POA: Diagnosis not present

## 2021-10-17 DIAGNOSIS — M109 Gout, unspecified: Secondary | ICD-10-CM | POA: Diagnosis not present

## 2021-10-17 DIAGNOSIS — M545 Low back pain, unspecified: Secondary | ICD-10-CM | POA: Diagnosis not present

## 2021-10-17 DIAGNOSIS — R293 Abnormal posture: Secondary | ICD-10-CM | POA: Diagnosis not present

## 2021-10-17 DIAGNOSIS — M5136 Other intervertebral disc degeneration, lumbar region: Secondary | ICD-10-CM | POA: Diagnosis not present

## 2021-10-17 DIAGNOSIS — M62830 Muscle spasm of back: Secondary | ICD-10-CM | POA: Diagnosis not present

## 2021-10-17 DIAGNOSIS — I2699 Other pulmonary embolism without acute cor pulmonale: Secondary | ICD-10-CM | POA: Diagnosis not present

## 2021-10-17 DIAGNOSIS — M6281 Muscle weakness (generalized): Secondary | ICD-10-CM | POA: Diagnosis not present

## 2021-10-18 DIAGNOSIS — R293 Abnormal posture: Secondary | ICD-10-CM | POA: Diagnosis not present

## 2021-10-18 DIAGNOSIS — M5136 Other intervertebral disc degeneration, lumbar region: Secondary | ICD-10-CM | POA: Diagnosis not present

## 2021-10-18 DIAGNOSIS — M545 Low back pain, unspecified: Secondary | ICD-10-CM | POA: Diagnosis not present

## 2021-10-18 DIAGNOSIS — M6281 Muscle weakness (generalized): Secondary | ICD-10-CM | POA: Diagnosis not present

## 2021-10-18 DIAGNOSIS — M109 Gout, unspecified: Secondary | ICD-10-CM | POA: Diagnosis not present

## 2021-10-18 DIAGNOSIS — M62830 Muscle spasm of back: Secondary | ICD-10-CM | POA: Diagnosis not present

## 2021-10-18 DIAGNOSIS — I2699 Other pulmonary embolism without acute cor pulmonale: Secondary | ICD-10-CM | POA: Diagnosis not present

## 2021-10-19 DIAGNOSIS — M5136 Other intervertebral disc degeneration, lumbar region: Secondary | ICD-10-CM | POA: Diagnosis not present

## 2021-10-19 DIAGNOSIS — M545 Low back pain, unspecified: Secondary | ICD-10-CM | POA: Diagnosis not present

## 2021-10-19 DIAGNOSIS — M62830 Muscle spasm of back: Secondary | ICD-10-CM | POA: Diagnosis not present

## 2021-10-19 DIAGNOSIS — I2699 Other pulmonary embolism without acute cor pulmonale: Secondary | ICD-10-CM | POA: Diagnosis not present

## 2021-10-19 DIAGNOSIS — M6281 Muscle weakness (generalized): Secondary | ICD-10-CM | POA: Diagnosis not present

## 2021-10-19 DIAGNOSIS — M109 Gout, unspecified: Secondary | ICD-10-CM | POA: Diagnosis not present

## 2021-10-19 DIAGNOSIS — R293 Abnormal posture: Secondary | ICD-10-CM | POA: Diagnosis not present

## 2021-10-20 DIAGNOSIS — F3342 Major depressive disorder, recurrent, in full remission: Secondary | ICD-10-CM | POA: Diagnosis not present

## 2021-10-20 DIAGNOSIS — M109 Gout, unspecified: Secondary | ICD-10-CM | POA: Diagnosis not present

## 2021-10-20 DIAGNOSIS — I2699 Other pulmonary embolism without acute cor pulmonale: Secondary | ICD-10-CM | POA: Diagnosis not present

## 2021-10-20 DIAGNOSIS — M5136 Other intervertebral disc degeneration, lumbar region: Secondary | ICD-10-CM | POA: Diagnosis not present

## 2021-10-20 DIAGNOSIS — R293 Abnormal posture: Secondary | ICD-10-CM | POA: Diagnosis not present

## 2021-10-20 DIAGNOSIS — M6281 Muscle weakness (generalized): Secondary | ICD-10-CM | POA: Diagnosis not present

## 2021-10-20 DIAGNOSIS — M62830 Muscle spasm of back: Secondary | ICD-10-CM | POA: Diagnosis not present

## 2021-10-20 DIAGNOSIS — M545 Low back pain, unspecified: Secondary | ICD-10-CM | POA: Diagnosis not present

## 2021-10-21 DIAGNOSIS — M545 Low back pain, unspecified: Secondary | ICD-10-CM | POA: Diagnosis not present

## 2021-10-21 DIAGNOSIS — M109 Gout, unspecified: Secondary | ICD-10-CM | POA: Diagnosis not present

## 2021-10-21 DIAGNOSIS — I2699 Other pulmonary embolism without acute cor pulmonale: Secondary | ICD-10-CM | POA: Diagnosis not present

## 2021-10-21 DIAGNOSIS — M62830 Muscle spasm of back: Secondary | ICD-10-CM | POA: Diagnosis not present

## 2021-10-21 DIAGNOSIS — M5136 Other intervertebral disc degeneration, lumbar region: Secondary | ICD-10-CM | POA: Diagnosis not present

## 2021-10-21 DIAGNOSIS — R293 Abnormal posture: Secondary | ICD-10-CM | POA: Diagnosis not present

## 2021-10-21 DIAGNOSIS — M6281 Muscle weakness (generalized): Secondary | ICD-10-CM | POA: Diagnosis not present

## 2021-10-24 DIAGNOSIS — R293 Abnormal posture: Secondary | ICD-10-CM | POA: Diagnosis not present

## 2021-10-24 DIAGNOSIS — M62830 Muscle spasm of back: Secondary | ICD-10-CM | POA: Diagnosis not present

## 2021-10-24 DIAGNOSIS — M6281 Muscle weakness (generalized): Secondary | ICD-10-CM | POA: Diagnosis not present

## 2021-10-24 DIAGNOSIS — M545 Low back pain, unspecified: Secondary | ICD-10-CM | POA: Diagnosis not present

## 2021-10-24 DIAGNOSIS — M109 Gout, unspecified: Secondary | ICD-10-CM | POA: Diagnosis not present

## 2021-10-24 DIAGNOSIS — I2699 Other pulmonary embolism without acute cor pulmonale: Secondary | ICD-10-CM | POA: Diagnosis not present

## 2021-10-24 DIAGNOSIS — M5136 Other intervertebral disc degeneration, lumbar region: Secondary | ICD-10-CM | POA: Diagnosis not present

## 2021-10-25 DIAGNOSIS — L603 Nail dystrophy: Secondary | ICD-10-CM | POA: Diagnosis not present

## 2021-10-25 DIAGNOSIS — M109 Gout, unspecified: Secondary | ICD-10-CM | POA: Diagnosis not present

## 2021-10-25 DIAGNOSIS — M6281 Muscle weakness (generalized): Secondary | ICD-10-CM | POA: Diagnosis not present

## 2021-10-25 DIAGNOSIS — I739 Peripheral vascular disease, unspecified: Secondary | ICD-10-CM | POA: Diagnosis not present

## 2021-10-25 DIAGNOSIS — I2699 Other pulmonary embolism without acute cor pulmonale: Secondary | ICD-10-CM | POA: Diagnosis not present

## 2021-10-25 DIAGNOSIS — M5136 Other intervertebral disc degeneration, lumbar region: Secondary | ICD-10-CM | POA: Diagnosis not present

## 2021-10-25 DIAGNOSIS — R293 Abnormal posture: Secondary | ICD-10-CM | POA: Diagnosis not present

## 2021-10-25 DIAGNOSIS — B351 Tinea unguium: Secondary | ICD-10-CM | POA: Diagnosis not present

## 2021-10-25 DIAGNOSIS — M545 Low back pain, unspecified: Secondary | ICD-10-CM | POA: Diagnosis not present

## 2021-10-25 DIAGNOSIS — M62830 Muscle spasm of back: Secondary | ICD-10-CM | POA: Diagnosis not present

## 2021-10-26 DIAGNOSIS — R293 Abnormal posture: Secondary | ICD-10-CM | POA: Diagnosis not present

## 2021-10-26 DIAGNOSIS — M109 Gout, unspecified: Secondary | ICD-10-CM | POA: Diagnosis not present

## 2021-10-26 DIAGNOSIS — M62830 Muscle spasm of back: Secondary | ICD-10-CM | POA: Diagnosis not present

## 2021-10-26 DIAGNOSIS — M5136 Other intervertebral disc degeneration, lumbar region: Secondary | ICD-10-CM | POA: Diagnosis not present

## 2021-10-26 DIAGNOSIS — I2699 Other pulmonary embolism without acute cor pulmonale: Secondary | ICD-10-CM | POA: Diagnosis not present

## 2021-10-26 DIAGNOSIS — M545 Low back pain, unspecified: Secondary | ICD-10-CM | POA: Diagnosis not present

## 2021-10-26 DIAGNOSIS — M6281 Muscle weakness (generalized): Secondary | ICD-10-CM | POA: Diagnosis not present

## 2021-10-27 DIAGNOSIS — M5136 Other intervertebral disc degeneration, lumbar region: Secondary | ICD-10-CM | POA: Diagnosis not present

## 2021-10-27 DIAGNOSIS — I2699 Other pulmonary embolism without acute cor pulmonale: Secondary | ICD-10-CM | POA: Diagnosis not present

## 2021-10-27 DIAGNOSIS — R293 Abnormal posture: Secondary | ICD-10-CM | POA: Diagnosis not present

## 2021-10-27 DIAGNOSIS — M62830 Muscle spasm of back: Secondary | ICD-10-CM | POA: Diagnosis not present

## 2021-10-27 DIAGNOSIS — M6281 Muscle weakness (generalized): Secondary | ICD-10-CM | POA: Diagnosis not present

## 2021-10-27 DIAGNOSIS — M545 Low back pain, unspecified: Secondary | ICD-10-CM | POA: Diagnosis not present

## 2021-10-27 DIAGNOSIS — M109 Gout, unspecified: Secondary | ICD-10-CM | POA: Diagnosis not present

## 2021-10-28 DIAGNOSIS — M109 Gout, unspecified: Secondary | ICD-10-CM | POA: Diagnosis not present

## 2021-10-28 DIAGNOSIS — R293 Abnormal posture: Secondary | ICD-10-CM | POA: Diagnosis not present

## 2021-10-28 DIAGNOSIS — M545 Low back pain, unspecified: Secondary | ICD-10-CM | POA: Diagnosis not present

## 2021-10-28 DIAGNOSIS — M62830 Muscle spasm of back: Secondary | ICD-10-CM | POA: Diagnosis not present

## 2021-10-28 DIAGNOSIS — I2699 Other pulmonary embolism without acute cor pulmonale: Secondary | ICD-10-CM | POA: Diagnosis not present

## 2021-10-28 DIAGNOSIS — M5136 Other intervertebral disc degeneration, lumbar region: Secondary | ICD-10-CM | POA: Diagnosis not present

## 2021-10-28 DIAGNOSIS — M6281 Muscle weakness (generalized): Secondary | ICD-10-CM | POA: Diagnosis not present

## 2021-10-31 DIAGNOSIS — R293 Abnormal posture: Secondary | ICD-10-CM | POA: Diagnosis not present

## 2021-10-31 DIAGNOSIS — M62830 Muscle spasm of back: Secondary | ICD-10-CM | POA: Diagnosis not present

## 2021-10-31 DIAGNOSIS — M109 Gout, unspecified: Secondary | ICD-10-CM | POA: Diagnosis not present

## 2021-10-31 DIAGNOSIS — M545 Low back pain, unspecified: Secondary | ICD-10-CM | POA: Diagnosis not present

## 2021-10-31 DIAGNOSIS — M6281 Muscle weakness (generalized): Secondary | ICD-10-CM | POA: Diagnosis not present

## 2021-10-31 DIAGNOSIS — I2699 Other pulmonary embolism without acute cor pulmonale: Secondary | ICD-10-CM | POA: Diagnosis not present

## 2021-10-31 DIAGNOSIS — M5136 Other intervertebral disc degeneration, lumbar region: Secondary | ICD-10-CM | POA: Diagnosis not present

## 2021-11-01 DIAGNOSIS — M62830 Muscle spasm of back: Secondary | ICD-10-CM | POA: Diagnosis not present

## 2021-11-01 DIAGNOSIS — I2699 Other pulmonary embolism without acute cor pulmonale: Secondary | ICD-10-CM | POA: Diagnosis not present

## 2021-11-01 DIAGNOSIS — M109 Gout, unspecified: Secondary | ICD-10-CM | POA: Diagnosis not present

## 2021-11-01 DIAGNOSIS — M6281 Muscle weakness (generalized): Secondary | ICD-10-CM | POA: Diagnosis not present

## 2021-11-01 DIAGNOSIS — R293 Abnormal posture: Secondary | ICD-10-CM | POA: Diagnosis not present

## 2021-11-01 DIAGNOSIS — M5136 Other intervertebral disc degeneration, lumbar region: Secondary | ICD-10-CM | POA: Diagnosis not present

## 2021-11-01 DIAGNOSIS — M545 Low back pain, unspecified: Secondary | ICD-10-CM | POA: Diagnosis not present

## 2021-11-02 DIAGNOSIS — M109 Gout, unspecified: Secondary | ICD-10-CM | POA: Diagnosis not present

## 2021-11-02 DIAGNOSIS — M545 Low back pain, unspecified: Secondary | ICD-10-CM | POA: Diagnosis not present

## 2021-11-02 DIAGNOSIS — R293 Abnormal posture: Secondary | ICD-10-CM | POA: Diagnosis not present

## 2021-11-02 DIAGNOSIS — M62830 Muscle spasm of back: Secondary | ICD-10-CM | POA: Diagnosis not present

## 2021-11-02 DIAGNOSIS — M5136 Other intervertebral disc degeneration, lumbar region: Secondary | ICD-10-CM | POA: Diagnosis not present

## 2021-11-02 DIAGNOSIS — I2699 Other pulmonary embolism without acute cor pulmonale: Secondary | ICD-10-CM | POA: Diagnosis not present

## 2021-11-02 DIAGNOSIS — M6281 Muscle weakness (generalized): Secondary | ICD-10-CM | POA: Diagnosis not present

## 2021-11-03 DIAGNOSIS — M6281 Muscle weakness (generalized): Secondary | ICD-10-CM | POA: Diagnosis not present

## 2021-11-03 DIAGNOSIS — M109 Gout, unspecified: Secondary | ICD-10-CM | POA: Diagnosis not present

## 2021-11-03 DIAGNOSIS — I2699 Other pulmonary embolism without acute cor pulmonale: Secondary | ICD-10-CM | POA: Diagnosis not present

## 2021-11-03 DIAGNOSIS — M5136 Other intervertebral disc degeneration, lumbar region: Secondary | ICD-10-CM | POA: Diagnosis not present

## 2021-11-03 DIAGNOSIS — R293 Abnormal posture: Secondary | ICD-10-CM | POA: Diagnosis not present

## 2021-11-03 DIAGNOSIS — M62830 Muscle spasm of back: Secondary | ICD-10-CM | POA: Diagnosis not present

## 2021-11-03 DIAGNOSIS — M545 Low back pain, unspecified: Secondary | ICD-10-CM | POA: Diagnosis not present

## 2021-11-03 DIAGNOSIS — F3342 Major depressive disorder, recurrent, in full remission: Secondary | ICD-10-CM | POA: Diagnosis not present

## 2021-11-04 DIAGNOSIS — M5136 Other intervertebral disc degeneration, lumbar region: Secondary | ICD-10-CM | POA: Diagnosis not present

## 2021-11-04 DIAGNOSIS — M6281 Muscle weakness (generalized): Secondary | ICD-10-CM | POA: Diagnosis not present

## 2021-11-04 DIAGNOSIS — I2699 Other pulmonary embolism without acute cor pulmonale: Secondary | ICD-10-CM | POA: Diagnosis not present

## 2021-11-04 DIAGNOSIS — R293 Abnormal posture: Secondary | ICD-10-CM | POA: Diagnosis not present

## 2021-11-04 DIAGNOSIS — M62830 Muscle spasm of back: Secondary | ICD-10-CM | POA: Diagnosis not present

## 2021-11-04 DIAGNOSIS — M545 Low back pain, unspecified: Secondary | ICD-10-CM | POA: Diagnosis not present

## 2021-11-04 DIAGNOSIS — M109 Gout, unspecified: Secondary | ICD-10-CM | POA: Diagnosis not present

## 2021-11-07 DIAGNOSIS — M62830 Muscle spasm of back: Secondary | ICD-10-CM | POA: Diagnosis not present

## 2021-11-07 DIAGNOSIS — M6281 Muscle weakness (generalized): Secondary | ICD-10-CM | POA: Diagnosis not present

## 2021-11-07 DIAGNOSIS — M5136 Other intervertebral disc degeneration, lumbar region: Secondary | ICD-10-CM | POA: Diagnosis not present

## 2021-11-07 DIAGNOSIS — M109 Gout, unspecified: Secondary | ICD-10-CM | POA: Diagnosis not present

## 2021-11-07 DIAGNOSIS — I2699 Other pulmonary embolism without acute cor pulmonale: Secondary | ICD-10-CM | POA: Diagnosis not present

## 2021-11-07 DIAGNOSIS — M545 Low back pain, unspecified: Secondary | ICD-10-CM | POA: Diagnosis not present

## 2021-11-07 DIAGNOSIS — R293 Abnormal posture: Secondary | ICD-10-CM | POA: Diagnosis not present

## 2021-11-08 DIAGNOSIS — I2699 Other pulmonary embolism without acute cor pulmonale: Secondary | ICD-10-CM | POA: Diagnosis not present

## 2021-11-08 DIAGNOSIS — R293 Abnormal posture: Secondary | ICD-10-CM | POA: Diagnosis not present

## 2021-11-08 DIAGNOSIS — M5136 Other intervertebral disc degeneration, lumbar region: Secondary | ICD-10-CM | POA: Diagnosis not present

## 2021-11-08 DIAGNOSIS — M109 Gout, unspecified: Secondary | ICD-10-CM | POA: Diagnosis not present

## 2021-11-08 DIAGNOSIS — M62830 Muscle spasm of back: Secondary | ICD-10-CM | POA: Diagnosis not present

## 2021-11-08 DIAGNOSIS — M6281 Muscle weakness (generalized): Secondary | ICD-10-CM | POA: Diagnosis not present

## 2021-11-08 DIAGNOSIS — M545 Low back pain, unspecified: Secondary | ICD-10-CM | POA: Diagnosis not present

## 2021-11-09 DIAGNOSIS — I2699 Other pulmonary embolism without acute cor pulmonale: Secondary | ICD-10-CM | POA: Diagnosis not present

## 2021-11-09 DIAGNOSIS — M109 Gout, unspecified: Secondary | ICD-10-CM | POA: Diagnosis not present

## 2021-11-09 DIAGNOSIS — R293 Abnormal posture: Secondary | ICD-10-CM | POA: Diagnosis not present

## 2021-11-09 DIAGNOSIS — M545 Low back pain, unspecified: Secondary | ICD-10-CM | POA: Diagnosis not present

## 2021-11-09 DIAGNOSIS — M5136 Other intervertebral disc degeneration, lumbar region: Secondary | ICD-10-CM | POA: Diagnosis not present

## 2021-11-09 DIAGNOSIS — M6281 Muscle weakness (generalized): Secondary | ICD-10-CM | POA: Diagnosis not present

## 2021-11-09 DIAGNOSIS — M62830 Muscle spasm of back: Secondary | ICD-10-CM | POA: Diagnosis not present

## 2021-11-10 DIAGNOSIS — M545 Low back pain, unspecified: Secondary | ICD-10-CM | POA: Diagnosis not present

## 2021-11-10 DIAGNOSIS — R293 Abnormal posture: Secondary | ICD-10-CM | POA: Diagnosis not present

## 2021-11-10 DIAGNOSIS — M109 Gout, unspecified: Secondary | ICD-10-CM | POA: Diagnosis not present

## 2021-11-10 DIAGNOSIS — I2699 Other pulmonary embolism without acute cor pulmonale: Secondary | ICD-10-CM | POA: Diagnosis not present

## 2021-11-10 DIAGNOSIS — M6281 Muscle weakness (generalized): Secondary | ICD-10-CM | POA: Diagnosis not present

## 2021-11-10 DIAGNOSIS — M62830 Muscle spasm of back: Secondary | ICD-10-CM | POA: Diagnosis not present

## 2021-11-10 DIAGNOSIS — M5136 Other intervertebral disc degeneration, lumbar region: Secondary | ICD-10-CM | POA: Diagnosis not present

## 2021-11-11 DIAGNOSIS — R293 Abnormal posture: Secondary | ICD-10-CM | POA: Diagnosis not present

## 2021-11-11 DIAGNOSIS — M109 Gout, unspecified: Secondary | ICD-10-CM | POA: Diagnosis not present

## 2021-11-11 DIAGNOSIS — M62830 Muscle spasm of back: Secondary | ICD-10-CM | POA: Diagnosis not present

## 2021-11-11 DIAGNOSIS — M6281 Muscle weakness (generalized): Secondary | ICD-10-CM | POA: Diagnosis not present

## 2021-11-11 DIAGNOSIS — I2699 Other pulmonary embolism without acute cor pulmonale: Secondary | ICD-10-CM | POA: Diagnosis not present

## 2021-11-11 DIAGNOSIS — M5136 Other intervertebral disc degeneration, lumbar region: Secondary | ICD-10-CM | POA: Diagnosis not present

## 2021-11-11 DIAGNOSIS — M545 Low back pain, unspecified: Secondary | ICD-10-CM | POA: Diagnosis not present

## 2021-11-14 DIAGNOSIS — I2699 Other pulmonary embolism without acute cor pulmonale: Secondary | ICD-10-CM | POA: Diagnosis not present

## 2021-11-14 DIAGNOSIS — M6281 Muscle weakness (generalized): Secondary | ICD-10-CM | POA: Diagnosis not present

## 2021-11-17 DIAGNOSIS — F3342 Major depressive disorder, recurrent, in full remission: Secondary | ICD-10-CM | POA: Diagnosis not present

## 2021-12-01 DIAGNOSIS — F3342 Major depressive disorder, recurrent, in full remission: Secondary | ICD-10-CM | POA: Diagnosis not present

## 2021-12-07 DIAGNOSIS — F3342 Major depressive disorder, recurrent, in full remission: Secondary | ICD-10-CM | POA: Diagnosis not present

## 2021-12-20 DIAGNOSIS — F3342 Major depressive disorder, recurrent, in full remission: Secondary | ICD-10-CM | POA: Diagnosis not present

## 2021-12-21 DIAGNOSIS — K0510 Chronic gingivitis, plaque induced: Secondary | ICD-10-CM | POA: Diagnosis not present

## 2021-12-21 DIAGNOSIS — I714 Abdominal aortic aneurysm, without rupture, unspecified: Secondary | ICD-10-CM | POA: Diagnosis not present

## 2021-12-21 DIAGNOSIS — L219 Seborrheic dermatitis, unspecified: Secondary | ICD-10-CM | POA: Diagnosis not present

## 2021-12-21 DIAGNOSIS — L210 Seborrhea capitis: Secondary | ICD-10-CM | POA: Diagnosis not present

## 2021-12-21 DIAGNOSIS — F0150 Vascular dementia without behavioral disturbance: Secondary | ICD-10-CM | POA: Diagnosis not present

## 2021-12-29 ENCOUNTER — Ambulatory Visit: Payer: Self-pay | Admitting: Licensed Clinical Social Worker

## 2021-12-29 NOTE — Patient Outreach (Signed)
  Care Coordination   12/29/2021 Name: Dennis [name] MRN: [id number] DOB: [birth year]   Care Coordination Outreach Attempts:  An unsuccessful telephone outreach was attempted today to offer the patient information about available care coordination services as a benefit of their health plan.   Follow Up Plan:  No further outreach attempts will be made at this time. We have been unable to contact the patient to offer or enroll patient in care coordination services  Encounter Outcome:  No Answer  Care Coordination Interventions Activated:  No   Care Coordination Interventions:  No, not indicated    Ander Gaster, MSW  Social Worker [facility]  [phone]

## 2022-01-12 DIAGNOSIS — F3342 Major depressive disorder, recurrent, in full remission: Secondary | ICD-10-CM | POA: Diagnosis not present

## 2022-01-17 DIAGNOSIS — M62830 Muscle spasm of back: Secondary | ICD-10-CM | POA: Diagnosis not present

## 2022-01-17 DIAGNOSIS — I2699 Other pulmonary embolism without acute cor pulmonale: Secondary | ICD-10-CM | POA: Diagnosis not present

## 2022-01-17 DIAGNOSIS — M5136 Other intervertebral disc degeneration, lumbar region: Secondary | ICD-10-CM | POA: Diagnosis not present

## 2022-01-17 DIAGNOSIS — M6281 Muscle weakness (generalized): Secondary | ICD-10-CM | POA: Diagnosis not present

## 2022-01-18 DIAGNOSIS — I2699 Other pulmonary embolism without acute cor pulmonale: Secondary | ICD-10-CM | POA: Diagnosis not present

## 2022-01-18 DIAGNOSIS — M62830 Muscle spasm of back: Secondary | ICD-10-CM | POA: Diagnosis not present

## 2022-01-18 DIAGNOSIS — M6281 Muscle weakness (generalized): Secondary | ICD-10-CM | POA: Diagnosis not present

## 2022-01-18 DIAGNOSIS — M5136 Other intervertebral disc degeneration, lumbar region: Secondary | ICD-10-CM | POA: Diagnosis not present

## 2022-01-19 DIAGNOSIS — I2699 Other pulmonary embolism without acute cor pulmonale: Secondary | ICD-10-CM | POA: Diagnosis not present

## 2022-01-19 DIAGNOSIS — M6281 Muscle weakness (generalized): Secondary | ICD-10-CM | POA: Diagnosis not present

## 2022-01-19 DIAGNOSIS — M5136 Other intervertebral disc degeneration, lumbar region: Secondary | ICD-10-CM | POA: Diagnosis not present

## 2022-01-19 DIAGNOSIS — M62830 Muscle spasm of back: Secondary | ICD-10-CM | POA: Diagnosis not present

## 2022-01-20 DIAGNOSIS — I2699 Other pulmonary embolism without acute cor pulmonale: Secondary | ICD-10-CM | POA: Diagnosis not present

## 2022-01-20 DIAGNOSIS — M62830 Muscle spasm of back: Secondary | ICD-10-CM | POA: Diagnosis not present

## 2022-01-20 DIAGNOSIS — M6281 Muscle weakness (generalized): Secondary | ICD-10-CM | POA: Diagnosis not present

## 2022-01-20 DIAGNOSIS — M5136 Other intervertebral disc degeneration, lumbar region: Secondary | ICD-10-CM | POA: Diagnosis not present

## 2022-01-23 DIAGNOSIS — M6281 Muscle weakness (generalized): Secondary | ICD-10-CM | POA: Diagnosis not present

## 2022-01-23 DIAGNOSIS — M5136 Other intervertebral disc degeneration, lumbar region: Secondary | ICD-10-CM | POA: Diagnosis not present

## 2022-01-23 DIAGNOSIS — I2699 Other pulmonary embolism without acute cor pulmonale: Secondary | ICD-10-CM | POA: Diagnosis not present

## 2022-01-23 DIAGNOSIS — M62830 Muscle spasm of back: Secondary | ICD-10-CM | POA: Diagnosis not present

## 2022-01-24 DIAGNOSIS — M62830 Muscle spasm of back: Secondary | ICD-10-CM | POA: Diagnosis not present

## 2022-01-24 DIAGNOSIS — M6281 Muscle weakness (generalized): Secondary | ICD-10-CM | POA: Diagnosis not present

## 2022-01-24 DIAGNOSIS — M5136 Other intervertebral disc degeneration, lumbar region: Secondary | ICD-10-CM | POA: Diagnosis not present

## 2022-01-24 DIAGNOSIS — I2699 Other pulmonary embolism without acute cor pulmonale: Secondary | ICD-10-CM | POA: Diagnosis not present

## 2022-01-25 DIAGNOSIS — I2699 Other pulmonary embolism without acute cor pulmonale: Secondary | ICD-10-CM | POA: Diagnosis not present

## 2022-01-25 DIAGNOSIS — M62830 Muscle spasm of back: Secondary | ICD-10-CM | POA: Diagnosis not present

## 2022-01-25 DIAGNOSIS — M6281 Muscle weakness (generalized): Secondary | ICD-10-CM | POA: Diagnosis not present

## 2022-01-25 DIAGNOSIS — M5136 Other intervertebral disc degeneration, lumbar region: Secondary | ICD-10-CM | POA: Diagnosis not present

## 2022-01-26 DIAGNOSIS — I2699 Other pulmonary embolism without acute cor pulmonale: Secondary | ICD-10-CM | POA: Diagnosis not present

## 2022-01-26 DIAGNOSIS — M5136 Other intervertebral disc degeneration, lumbar region: Secondary | ICD-10-CM | POA: Diagnosis not present

## 2022-01-26 DIAGNOSIS — B351 Tinea unguium: Secondary | ICD-10-CM | POA: Diagnosis not present

## 2022-01-26 DIAGNOSIS — I739 Peripheral vascular disease, unspecified: Secondary | ICD-10-CM | POA: Diagnosis not present

## 2022-01-26 DIAGNOSIS — M6281 Muscle weakness (generalized): Secondary | ICD-10-CM | POA: Diagnosis not present

## 2022-01-26 DIAGNOSIS — M62830 Muscle spasm of back: Secondary | ICD-10-CM | POA: Diagnosis not present

## 2022-01-27 DIAGNOSIS — M6281 Muscle weakness (generalized): Secondary | ICD-10-CM | POA: Diagnosis not present

## 2022-01-27 DIAGNOSIS — M5136 Other intervertebral disc degeneration, lumbar region: Secondary | ICD-10-CM | POA: Diagnosis not present

## 2022-01-27 DIAGNOSIS — I2699 Other pulmonary embolism without acute cor pulmonale: Secondary | ICD-10-CM | POA: Diagnosis not present

## 2022-01-27 DIAGNOSIS — M62830 Muscle spasm of back: Secondary | ICD-10-CM | POA: Diagnosis not present

## 2022-01-30 DIAGNOSIS — M62830 Muscle spasm of back: Secondary | ICD-10-CM | POA: Diagnosis not present

## 2022-01-30 DIAGNOSIS — M5136 Other intervertebral disc degeneration, lumbar region: Secondary | ICD-10-CM | POA: Diagnosis not present

## 2022-01-30 DIAGNOSIS — M6281 Muscle weakness (generalized): Secondary | ICD-10-CM | POA: Diagnosis not present

## 2022-01-30 DIAGNOSIS — I2699 Other pulmonary embolism without acute cor pulmonale: Secondary | ICD-10-CM | POA: Diagnosis not present

## 2022-01-31 DIAGNOSIS — M6281 Muscle weakness (generalized): Secondary | ICD-10-CM | POA: Diagnosis not present

## 2022-01-31 DIAGNOSIS — M5136 Other intervertebral disc degeneration, lumbar region: Secondary | ICD-10-CM | POA: Diagnosis not present

## 2022-01-31 DIAGNOSIS — I2699 Other pulmonary embolism without acute cor pulmonale: Secondary | ICD-10-CM | POA: Diagnosis not present

## 2022-01-31 DIAGNOSIS — M62830 Muscle spasm of back: Secondary | ICD-10-CM | POA: Diagnosis not present

## 2022-02-01 DIAGNOSIS — M6281 Muscle weakness (generalized): Secondary | ICD-10-CM | POA: Diagnosis not present

## 2022-02-01 DIAGNOSIS — M62830 Muscle spasm of back: Secondary | ICD-10-CM | POA: Diagnosis not present

## 2022-02-01 DIAGNOSIS — I2699 Other pulmonary embolism without acute cor pulmonale: Secondary | ICD-10-CM | POA: Diagnosis not present

## 2022-02-01 DIAGNOSIS — M5136 Other intervertebral disc degeneration, lumbar region: Secondary | ICD-10-CM | POA: Diagnosis not present

## 2022-02-02 DIAGNOSIS — M6281 Muscle weakness (generalized): Secondary | ICD-10-CM | POA: Diagnosis not present

## 2022-02-02 DIAGNOSIS — M5136 Other intervertebral disc degeneration, lumbar region: Secondary | ICD-10-CM | POA: Diagnosis not present

## 2022-02-02 DIAGNOSIS — I2699 Other pulmonary embolism without acute cor pulmonale: Secondary | ICD-10-CM | POA: Diagnosis not present

## 2022-02-02 DIAGNOSIS — M62830 Muscle spasm of back: Secondary | ICD-10-CM | POA: Diagnosis not present

## 2022-02-03 DIAGNOSIS — M5136 Other intervertebral disc degeneration, lumbar region: Secondary | ICD-10-CM | POA: Diagnosis not present

## 2022-02-03 DIAGNOSIS — M62830 Muscle spasm of back: Secondary | ICD-10-CM | POA: Diagnosis not present

## 2022-02-03 DIAGNOSIS — I2699 Other pulmonary embolism without acute cor pulmonale: Secondary | ICD-10-CM | POA: Diagnosis not present

## 2022-02-03 DIAGNOSIS — M6281 Muscle weakness (generalized): Secondary | ICD-10-CM | POA: Diagnosis not present

## 2022-02-06 DIAGNOSIS — I130 Hypertensive heart and chronic kidney disease with heart failure and stage 1 through stage 4 chronic kidney disease, or unspecified chronic kidney disease: Secondary | ICD-10-CM | POA: Diagnosis not present

## 2022-02-06 DIAGNOSIS — N1831 Chronic kidney disease, stage 3a: Secondary | ICD-10-CM | POA: Diagnosis not present

## 2022-02-06 DIAGNOSIS — M5136 Other intervertebral disc degeneration, lumbar region: Secondary | ICD-10-CM | POA: Diagnosis not present

## 2022-02-06 DIAGNOSIS — Z7409 Other reduced mobility: Secondary | ICD-10-CM | POA: Diagnosis not present

## 2022-02-06 DIAGNOSIS — E440 Moderate protein-calorie malnutrition: Secondary | ICD-10-CM | POA: Diagnosis not present

## 2022-02-06 DIAGNOSIS — I714 Abdominal aortic aneurysm, without rupture, unspecified: Secondary | ICD-10-CM | POA: Diagnosis not present

## 2022-02-06 DIAGNOSIS — D6859 Other primary thrombophilia: Secondary | ICD-10-CM | POA: Diagnosis not present

## 2022-02-06 DIAGNOSIS — M6281 Muscle weakness (generalized): Secondary | ICD-10-CM | POA: Diagnosis not present

## 2022-02-06 DIAGNOSIS — F0390 Unspecified dementia without behavioral disturbance: Secondary | ICD-10-CM | POA: Diagnosis not present

## 2022-02-06 DIAGNOSIS — M1990 Unspecified osteoarthritis, unspecified site: Secondary | ICD-10-CM | POA: Diagnosis not present

## 2022-02-06 DIAGNOSIS — I2699 Other pulmonary embolism without acute cor pulmonale: Secondary | ICD-10-CM | POA: Diagnosis not present

## 2022-02-06 DIAGNOSIS — M62830 Muscle spasm of back: Secondary | ICD-10-CM | POA: Diagnosis not present

## 2022-02-06 DIAGNOSIS — L299 Pruritus, unspecified: Secondary | ICD-10-CM | POA: Diagnosis not present

## 2022-02-07 DIAGNOSIS — M62830 Muscle spasm of back: Secondary | ICD-10-CM | POA: Diagnosis not present

## 2022-02-07 DIAGNOSIS — M6281 Muscle weakness (generalized): Secondary | ICD-10-CM | POA: Diagnosis not present

## 2022-02-07 DIAGNOSIS — Z23 Encounter for immunization: Secondary | ICD-10-CM | POA: Diagnosis not present

## 2022-02-07 DIAGNOSIS — I2699 Other pulmonary embolism without acute cor pulmonale: Secondary | ICD-10-CM | POA: Diagnosis not present

## 2022-02-07 DIAGNOSIS — M5136 Other intervertebral disc degeneration, lumbar region: Secondary | ICD-10-CM | POA: Diagnosis not present

## 2022-02-08 DIAGNOSIS — M5136 Other intervertebral disc degeneration, lumbar region: Secondary | ICD-10-CM | POA: Diagnosis not present

## 2022-02-08 DIAGNOSIS — I2699 Other pulmonary embolism without acute cor pulmonale: Secondary | ICD-10-CM | POA: Diagnosis not present

## 2022-02-08 DIAGNOSIS — M62830 Muscle spasm of back: Secondary | ICD-10-CM | POA: Diagnosis not present

## 2022-02-08 DIAGNOSIS — M6281 Muscle weakness (generalized): Secondary | ICD-10-CM | POA: Diagnosis not present

## 2022-02-09 DIAGNOSIS — I2699 Other pulmonary embolism without acute cor pulmonale: Secondary | ICD-10-CM | POA: Diagnosis not present

## 2022-02-09 DIAGNOSIS — M6281 Muscle weakness (generalized): Secondary | ICD-10-CM | POA: Diagnosis not present

## 2022-02-09 DIAGNOSIS — M62830 Muscle spasm of back: Secondary | ICD-10-CM | POA: Diagnosis not present

## 2022-02-09 DIAGNOSIS — M5136 Other intervertebral disc degeneration, lumbar region: Secondary | ICD-10-CM | POA: Diagnosis not present

## 2022-02-10 DIAGNOSIS — R739 Hyperglycemia, unspecified: Secondary | ICD-10-CM | POA: Diagnosis not present

## 2022-02-10 DIAGNOSIS — M62830 Muscle spasm of back: Secondary | ICD-10-CM | POA: Diagnosis not present

## 2022-02-10 DIAGNOSIS — M6281 Muscle weakness (generalized): Secondary | ICD-10-CM | POA: Diagnosis not present

## 2022-02-10 DIAGNOSIS — M5136 Other intervertebral disc degeneration, lumbar region: Secondary | ICD-10-CM | POA: Diagnosis not present

## 2022-02-10 DIAGNOSIS — I2699 Other pulmonary embolism without acute cor pulmonale: Secondary | ICD-10-CM | POA: Diagnosis not present

## 2022-02-10 DIAGNOSIS — E119 Type 2 diabetes mellitus without complications: Secondary | ICD-10-CM | POA: Diagnosis not present

## 2022-02-10 DIAGNOSIS — E569 Vitamin deficiency, unspecified: Secondary | ICD-10-CM | POA: Diagnosis not present

## 2022-02-10 DIAGNOSIS — E559 Vitamin D deficiency, unspecified: Secondary | ICD-10-CM | POA: Diagnosis not present

## 2022-02-10 DIAGNOSIS — E0810 Diabetes mellitus due to underlying condition with ketoacidosis without coma: Secondary | ICD-10-CM | POA: Diagnosis not present

## 2022-02-10 DIAGNOSIS — D513 Other dietary vitamin B12 deficiency anemia: Secondary | ICD-10-CM | POA: Diagnosis not present

## 2022-02-13 DIAGNOSIS — Z789 Other specified health status: Secondary | ICD-10-CM | POA: Diagnosis not present

## 2022-02-13 DIAGNOSIS — D696 Thrombocytopenia, unspecified: Secondary | ICD-10-CM | POA: Diagnosis not present

## 2022-02-13 DIAGNOSIS — D6859 Other primary thrombophilia: Secondary | ICD-10-CM | POA: Diagnosis not present

## 2022-02-16 DIAGNOSIS — F3342 Major depressive disorder, recurrent, in full remission: Secondary | ICD-10-CM | POA: Diagnosis not present

## 2022-02-27 DIAGNOSIS — F3341 Major depressive disorder, recurrent, in partial remission: Secondary | ICD-10-CM | POA: Diagnosis not present

## 2022-03-08 DIAGNOSIS — F3341 Major depressive disorder, recurrent, in partial remission: Secondary | ICD-10-CM | POA: Diagnosis not present

## 2022-03-15 DIAGNOSIS — F3341 Major depressive disorder, recurrent, in partial remission: Secondary | ICD-10-CM | POA: Diagnosis not present

## 2022-03-16 DIAGNOSIS — F3342 Major depressive disorder, recurrent, in full remission: Secondary | ICD-10-CM | POA: Diagnosis not present

## 2022-03-22 DIAGNOSIS — F3341 Major depressive disorder, recurrent, in partial remission: Secondary | ICD-10-CM | POA: Diagnosis not present

## 2022-03-29 DIAGNOSIS — F3341 Major depressive disorder, recurrent, in partial remission: Secondary | ICD-10-CM | POA: Diagnosis not present

## 2022-03-30 DIAGNOSIS — F3342 Major depressive disorder, recurrent, in full remission: Secondary | ICD-10-CM | POA: Diagnosis not present

## 2022-04-02 DIAGNOSIS — I2699 Other pulmonary embolism without acute cor pulmonale: Secondary | ICD-10-CM | POA: Diagnosis not present

## 2022-04-02 DIAGNOSIS — M6281 Muscle weakness (generalized): Secondary | ICD-10-CM | POA: Diagnosis not present

## 2022-04-03 DIAGNOSIS — F3341 Major depressive disorder, recurrent, in partial remission: Secondary | ICD-10-CM | POA: Diagnosis not present

## 2022-04-03 DIAGNOSIS — M6281 Muscle weakness (generalized): Secondary | ICD-10-CM | POA: Diagnosis not present

## 2022-04-03 DIAGNOSIS — I2699 Other pulmonary embolism without acute cor pulmonale: Secondary | ICD-10-CM | POA: Diagnosis not present

## 2022-04-04 DIAGNOSIS — M6281 Muscle weakness (generalized): Secondary | ICD-10-CM | POA: Diagnosis not present

## 2022-04-04 DIAGNOSIS — I2699 Other pulmonary embolism without acute cor pulmonale: Secondary | ICD-10-CM | POA: Diagnosis not present

## 2022-04-05 DIAGNOSIS — M6281 Muscle weakness (generalized): Secondary | ICD-10-CM | POA: Diagnosis not present

## 2022-04-05 DIAGNOSIS — I2699 Other pulmonary embolism without acute cor pulmonale: Secondary | ICD-10-CM | POA: Diagnosis not present

## 2022-04-07 DIAGNOSIS — I2699 Other pulmonary embolism without acute cor pulmonale: Secondary | ICD-10-CM | POA: Diagnosis not present

## 2022-04-07 DIAGNOSIS — M6281 Muscle weakness (generalized): Secondary | ICD-10-CM | POA: Diagnosis not present

## 2022-04-10 DIAGNOSIS — I2699 Other pulmonary embolism without acute cor pulmonale: Secondary | ICD-10-CM | POA: Diagnosis not present

## 2022-04-10 DIAGNOSIS — M6281 Muscle weakness (generalized): Secondary | ICD-10-CM | POA: Diagnosis not present

## 2022-04-11 DIAGNOSIS — M6281 Muscle weakness (generalized): Secondary | ICD-10-CM | POA: Diagnosis not present

## 2022-04-11 DIAGNOSIS — I2699 Other pulmonary embolism without acute cor pulmonale: Secondary | ICD-10-CM | POA: Diagnosis not present

## 2022-04-12 DIAGNOSIS — M6281 Muscle weakness (generalized): Secondary | ICD-10-CM | POA: Diagnosis not present

## 2022-04-12 DIAGNOSIS — I2699 Other pulmonary embolism without acute cor pulmonale: Secondary | ICD-10-CM | POA: Diagnosis not present

## 2022-04-13 DIAGNOSIS — I2699 Other pulmonary embolism without acute cor pulmonale: Secondary | ICD-10-CM | POA: Diagnosis not present

## 2022-04-13 DIAGNOSIS — M6281 Muscle weakness (generalized): Secondary | ICD-10-CM | POA: Diagnosis not present

## 2022-04-14 DIAGNOSIS — I2699 Other pulmonary embolism without acute cor pulmonale: Secondary | ICD-10-CM | POA: Diagnosis not present

## 2022-04-14 DIAGNOSIS — M6281 Muscle weakness (generalized): Secondary | ICD-10-CM | POA: Diagnosis not present

## 2022-04-17 DIAGNOSIS — M6281 Muscle weakness (generalized): Secondary | ICD-10-CM | POA: Diagnosis not present

## 2022-04-17 DIAGNOSIS — I2699 Other pulmonary embolism without acute cor pulmonale: Secondary | ICD-10-CM | POA: Diagnosis not present
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
## 2022-04-17 IMAGING — CR DG TIBIA/FIBULA 2V*L*
4 series · 4 of 4 positions shown · non-contrast
Comparison: None.

CLINICAL DATA: Pain

EXAM:
LEFT TIBIA AND FIBULA - 2 VIEW

[tibia ap (1 of 2)]
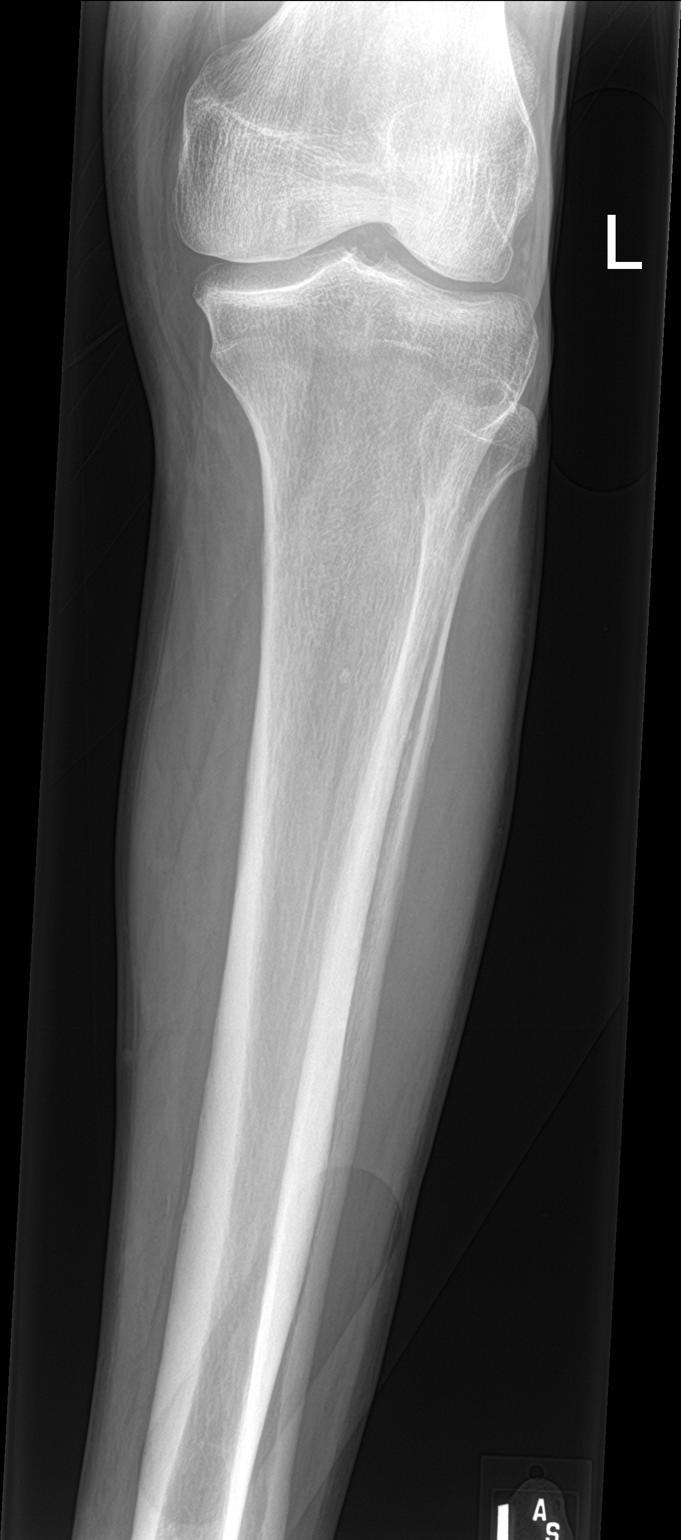

[tibia ap (2 of 2)]
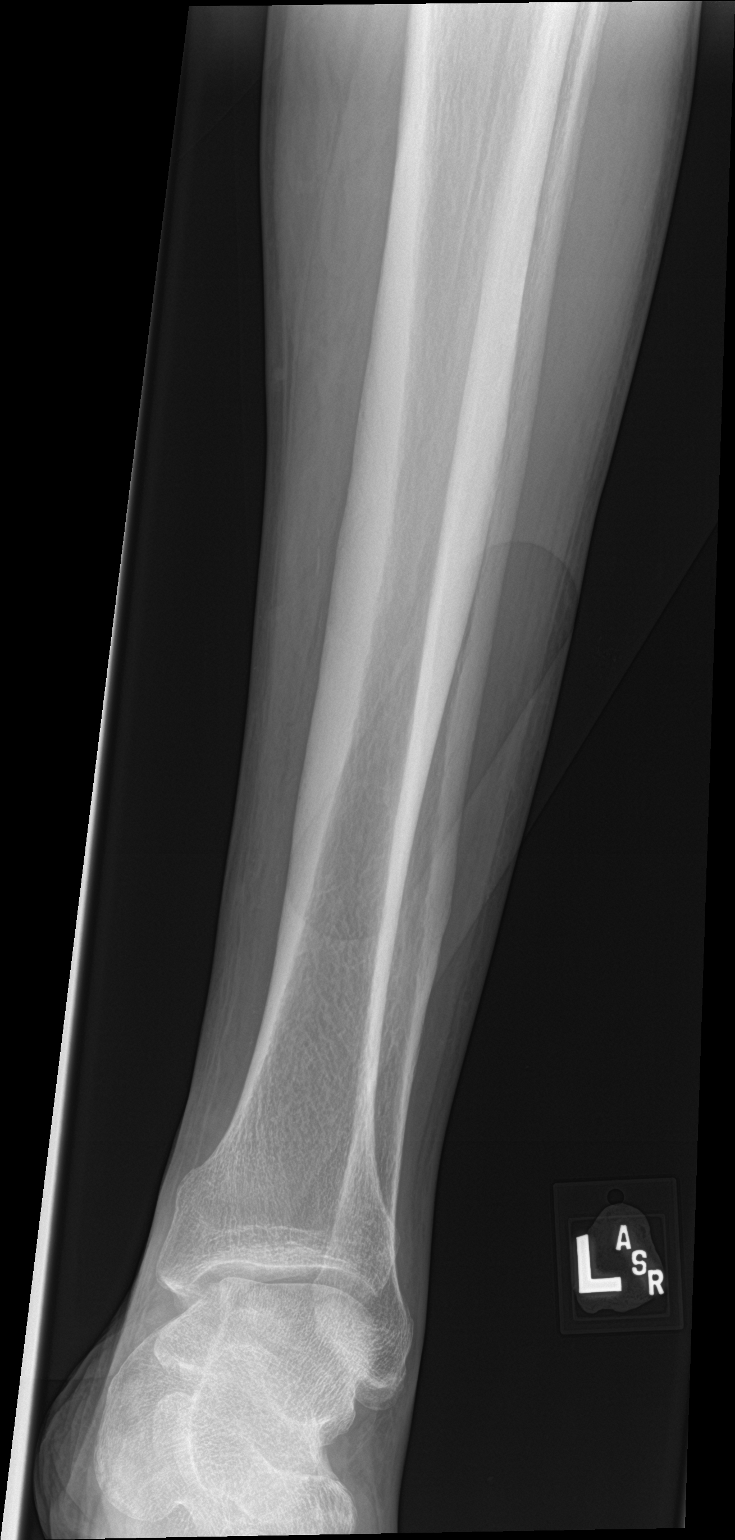

[tibia lat (1 of 2)]
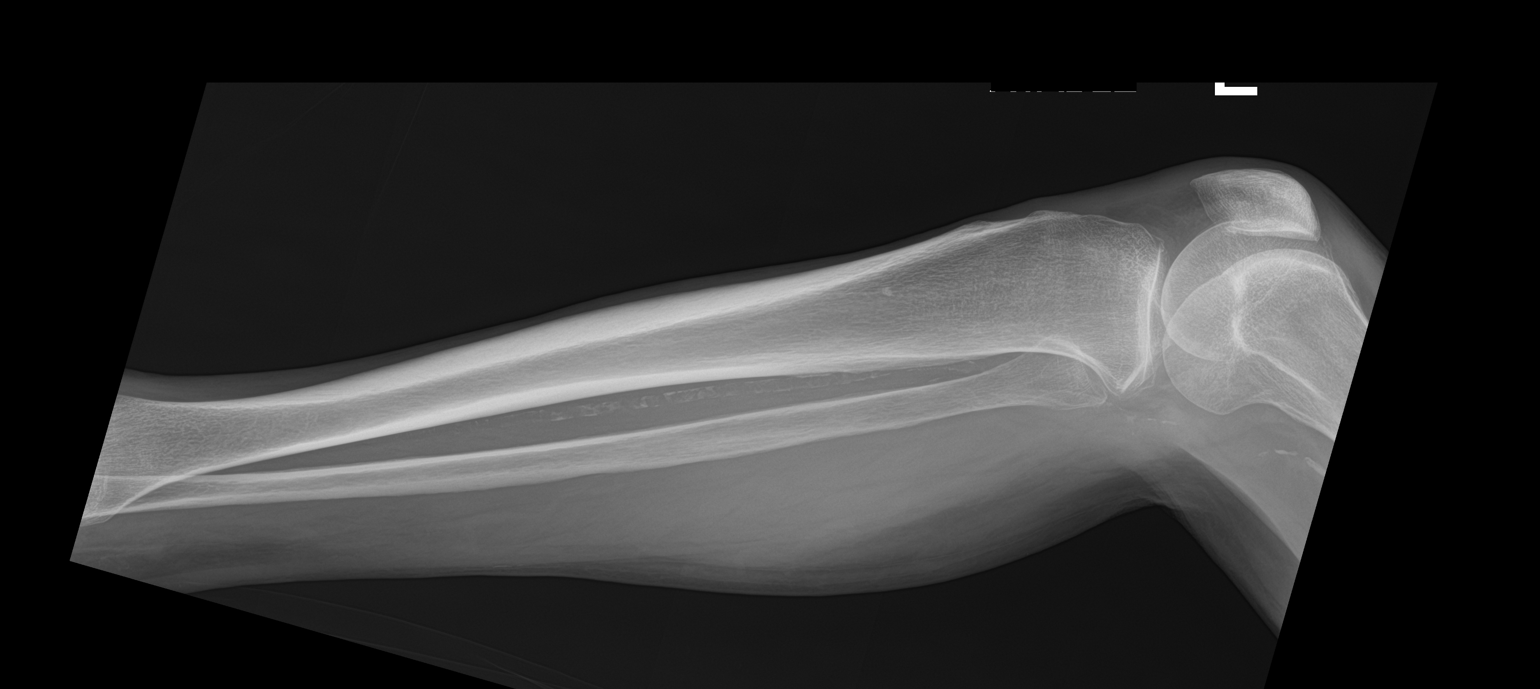

[tibia lat (2 of 2)]
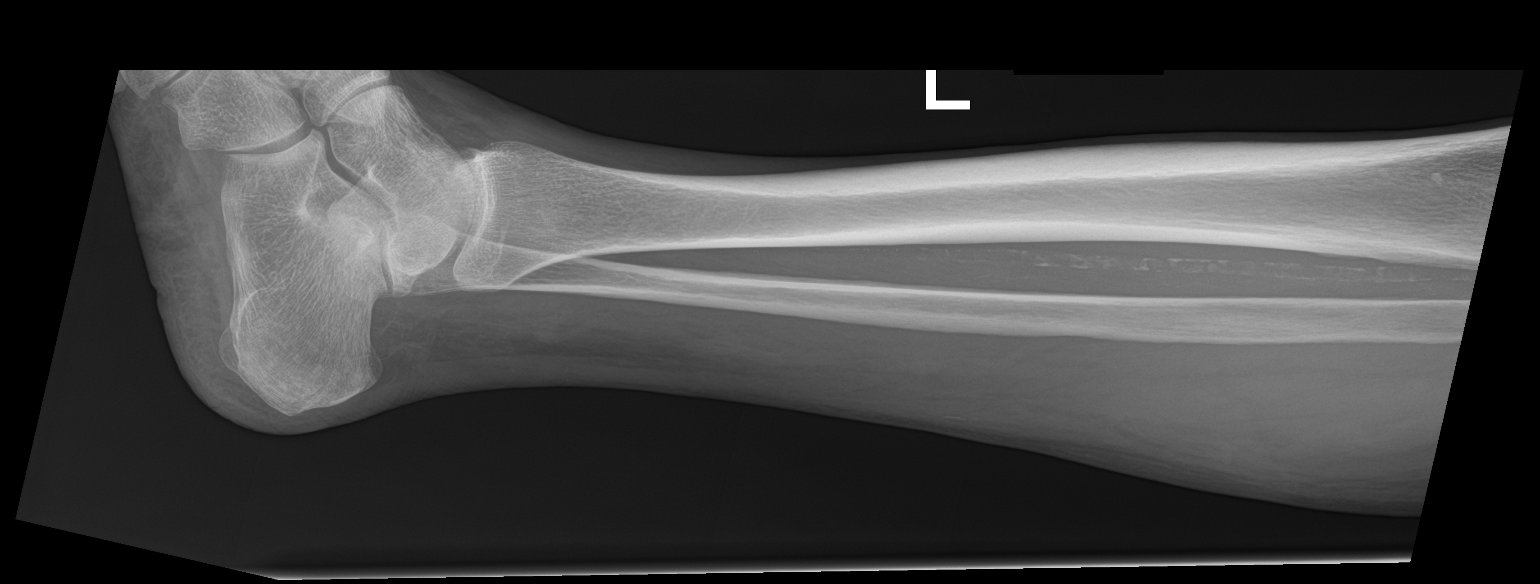

[4 of 4 positions shown; findings below may reference images not displayed]

FINDINGS: There is no evidence of fracture or other focal bone lesions. Soft
tissues are unremarkable. Vascular calcifications are noted.
IMPRESSION: Negative.

## 2022-04-18 DIAGNOSIS — I2699 Other pulmonary embolism without acute cor pulmonale: Secondary | ICD-10-CM | POA: Diagnosis not present

## 2022-04-18 DIAGNOSIS — M6281 Muscle weakness (generalized): Secondary | ICD-10-CM | POA: Diagnosis not present

## 2022-04-19 DIAGNOSIS — M6281 Muscle weakness (generalized): Secondary | ICD-10-CM | POA: Diagnosis not present

## 2022-04-19 DIAGNOSIS — I2699 Other pulmonary embolism without acute cor pulmonale: Secondary | ICD-10-CM | POA: Diagnosis not present

## 2022-04-20 DIAGNOSIS — M6281 Muscle weakness (generalized): Secondary | ICD-10-CM | POA: Diagnosis not present

## 2022-04-20 DIAGNOSIS — I2699 Other pulmonary embolism without acute cor pulmonale: Secondary | ICD-10-CM | POA: Diagnosis not present

## 2022-04-21 DIAGNOSIS — I2699 Other pulmonary embolism without acute cor pulmonale: Secondary | ICD-10-CM | POA: Diagnosis not present

## 2022-04-21 DIAGNOSIS — M6281 Muscle weakness (generalized): Secondary | ICD-10-CM | POA: Diagnosis not present

## 2022-04-24 DIAGNOSIS — M6281 Muscle weakness (generalized): Secondary | ICD-10-CM | POA: Diagnosis not present

## 2022-04-24 DIAGNOSIS — I2699 Other pulmonary embolism without acute cor pulmonale: Secondary | ICD-10-CM | POA: Diagnosis not present

## 2022-04-25 DIAGNOSIS — I2699 Other pulmonary embolism without acute cor pulmonale: Secondary | ICD-10-CM | POA: Diagnosis not present

## 2022-04-25 DIAGNOSIS — M6281 Muscle weakness (generalized): Secondary | ICD-10-CM | POA: Diagnosis not present

## 2022-04-26 DIAGNOSIS — I2699 Other pulmonary embolism without acute cor pulmonale: Secondary | ICD-10-CM | POA: Diagnosis not present

## 2022-04-26 DIAGNOSIS — M6281 Muscle weakness (generalized): Secondary | ICD-10-CM | POA: Diagnosis not present

## 2022-04-27 DIAGNOSIS — I2699 Other pulmonary embolism without acute cor pulmonale: Secondary | ICD-10-CM | POA: Diagnosis not present

## 2022-04-27 DIAGNOSIS — M6281 Muscle weakness (generalized): Secondary | ICD-10-CM | POA: Diagnosis not present

## 2022-04-28 DIAGNOSIS — M6281 Muscle weakness (generalized): Secondary | ICD-10-CM | POA: Diagnosis not present

## 2022-04-28 DIAGNOSIS — I10 Essential (primary) hypertension: Secondary | ICD-10-CM | POA: Diagnosis not present

## 2022-04-28 DIAGNOSIS — E559 Vitamin D deficiency, unspecified: Secondary | ICD-10-CM | POA: Diagnosis not present

## 2022-04-28 DIAGNOSIS — E569 Vitamin deficiency, unspecified: Secondary | ICD-10-CM | POA: Diagnosis not present

## 2022-04-28 DIAGNOSIS — D510 Vitamin B12 deficiency anemia due to intrinsic factor deficiency: Secondary | ICD-10-CM | POA: Diagnosis not present

## 2022-04-28 DIAGNOSIS — I2699 Other pulmonary embolism without acute cor pulmonale: Secondary | ICD-10-CM | POA: Diagnosis not present

## 2022-04-28 DIAGNOSIS — A3686 Diphtheritic conjunctivitis: Secondary | ICD-10-CM | POA: Diagnosis not present

## 2022-04-28 DIAGNOSIS — D649 Anemia, unspecified: Secondary | ICD-10-CM | POA: Diagnosis not present

## 2022-05-01 DIAGNOSIS — M6281 Muscle weakness (generalized): Secondary | ICD-10-CM | POA: Diagnosis not present

## 2022-05-01 DIAGNOSIS — I2699 Other pulmonary embolism without acute cor pulmonale: Secondary | ICD-10-CM | POA: Diagnosis not present

## 2022-05-02 DIAGNOSIS — M6281 Muscle weakness (generalized): Secondary | ICD-10-CM | POA: Diagnosis not present

## 2022-05-02 DIAGNOSIS — I2699 Other pulmonary embolism without acute cor pulmonale: Secondary | ICD-10-CM | POA: Diagnosis not present

## 2022-05-03 DIAGNOSIS — I2699 Other pulmonary embolism without acute cor pulmonale: Secondary | ICD-10-CM | POA: Diagnosis not present

## 2022-05-03 DIAGNOSIS — M6281 Muscle weakness (generalized): Secondary | ICD-10-CM | POA: Diagnosis not present

## 2022-05-04 DIAGNOSIS — I2699 Other pulmonary embolism without acute cor pulmonale: Secondary | ICD-10-CM | POA: Diagnosis not present

## 2022-05-04 DIAGNOSIS — M6281 Muscle weakness (generalized): Secondary | ICD-10-CM | POA: Diagnosis not present

## 2022-05-05 DIAGNOSIS — I2699 Other pulmonary embolism without acute cor pulmonale: Secondary | ICD-10-CM | POA: Diagnosis not present

## 2022-05-05 DIAGNOSIS — M6281 Muscle weakness (generalized): Secondary | ICD-10-CM | POA: Diagnosis not present

## 2022-05-10 DIAGNOSIS — Z0189 Encounter for other specified special examinations: Secondary | ICD-10-CM | POA: Diagnosis not present

## 2022-05-10 DIAGNOSIS — R0982 Postnasal drip: Secondary | ICD-10-CM | POA: Diagnosis not present

## 2022-05-10 DIAGNOSIS — Z20828 Contact with and (suspected) exposure to other viral communicable diseases: Secondary | ICD-10-CM | POA: Diagnosis not present

## 2022-05-18 DIAGNOSIS — F3342 Major depressive disorder, recurrent, in full remission: Secondary | ICD-10-CM | POA: Diagnosis not present

## 2022-05-24 DIAGNOSIS — M2041 Other hammer toe(s) (acquired), right foot: Secondary | ICD-10-CM | POA: Diagnosis not present

## 2022-05-24 DIAGNOSIS — I739 Peripheral vascular disease, unspecified: Secondary | ICD-10-CM | POA: Diagnosis not present

## 2022-05-24 DIAGNOSIS — M2042 Other hammer toe(s) (acquired), left foot: Secondary | ICD-10-CM | POA: Diagnosis not present

## 2022-05-24 DIAGNOSIS — B351 Tinea unguium: Secondary | ICD-10-CM | POA: Diagnosis not present

## 2022-06-05 DIAGNOSIS — Z7409 Other reduced mobility: Secondary | ICD-10-CM | POA: Diagnosis not present

## 2022-06-05 DIAGNOSIS — I130 Hypertensive heart and chronic kidney disease with heart failure and stage 1 through stage 4 chronic kidney disease, or unspecified chronic kidney disease: Secondary | ICD-10-CM | POA: Diagnosis not present

## 2022-06-05 DIAGNOSIS — F0390 Unspecified dementia without behavioral disturbance: Secondary | ICD-10-CM | POA: Diagnosis not present

## 2022-06-05 DIAGNOSIS — I5040 Unspecified combined systolic (congestive) and diastolic (congestive) heart failure: Secondary | ICD-10-CM | POA: Diagnosis not present

## 2022-06-05 DIAGNOSIS — N1831 Chronic kidney disease, stage 3a: Secondary | ICD-10-CM | POA: Diagnosis not present

## 2022-06-05 DIAGNOSIS — E440 Moderate protein-calorie malnutrition: Secondary | ICD-10-CM | POA: Diagnosis not present

## 2022-06-05 DIAGNOSIS — D6859 Other primary thrombophilia: Secondary | ICD-10-CM | POA: Diagnosis not present

## 2022-06-05 DIAGNOSIS — M1990 Unspecified osteoarthritis, unspecified site: Secondary | ICD-10-CM | POA: Diagnosis not present

## 2022-06-05 DIAGNOSIS — I714 Abdominal aortic aneurysm, without rupture, unspecified: Secondary | ICD-10-CM | POA: Diagnosis not present

## 2022-07-28 DIAGNOSIS — I2699 Other pulmonary embolism without acute cor pulmonale: Secondary | ICD-10-CM | POA: Diagnosis not present

## 2022-07-28 DIAGNOSIS — M6281 Muscle weakness (generalized): Secondary | ICD-10-CM | POA: Diagnosis not present

## 2022-07-28 DIAGNOSIS — N183 Chronic kidney disease, stage 3 unspecified: Secondary | ICD-10-CM | POA: Diagnosis not present

## 2022-07-28 DIAGNOSIS — M5136 Other intervertebral disc degeneration, lumbar region: Secondary | ICD-10-CM | POA: Diagnosis not present

## 2022-07-31 DIAGNOSIS — I2699 Other pulmonary embolism without acute cor pulmonale: Secondary | ICD-10-CM | POA: Diagnosis not present

## 2022-07-31 DIAGNOSIS — M6281 Muscle weakness (generalized): Secondary | ICD-10-CM | POA: Diagnosis not present

## 2022-07-31 DIAGNOSIS — N183 Chronic kidney disease, stage 3 unspecified: Secondary | ICD-10-CM | POA: Diagnosis not present

## 2022-07-31 DIAGNOSIS — M5136 Other intervertebral disc degeneration, lumbar region: Secondary | ICD-10-CM | POA: Diagnosis not present

## 2022-08-01 DIAGNOSIS — M5136 Other intervertebral disc degeneration, lumbar region: Secondary | ICD-10-CM | POA: Diagnosis not present

## 2022-08-01 DIAGNOSIS — M6281 Muscle weakness (generalized): Secondary | ICD-10-CM | POA: Diagnosis not present

## 2022-08-01 DIAGNOSIS — I2699 Other pulmonary embolism without acute cor pulmonale: Secondary | ICD-10-CM | POA: Diagnosis not present

## 2022-08-01 DIAGNOSIS — N183 Chronic kidney disease, stage 3 unspecified: Secondary | ICD-10-CM | POA: Diagnosis not present

## 2022-08-02 DIAGNOSIS — I2699 Other pulmonary embolism without acute cor pulmonale: Secondary | ICD-10-CM | POA: Diagnosis not present

## 2022-08-02 DIAGNOSIS — M5136 Other intervertebral disc degeneration, lumbar region: Secondary | ICD-10-CM | POA: Diagnosis not present

## 2022-08-02 DIAGNOSIS — N183 Chronic kidney disease, stage 3 unspecified: Secondary | ICD-10-CM | POA: Diagnosis not present

## 2022-08-02 DIAGNOSIS — M6281 Muscle weakness (generalized): Secondary | ICD-10-CM | POA: Diagnosis not present

## 2022-08-03 DIAGNOSIS — M5136 Other intervertebral disc degeneration, lumbar region: Secondary | ICD-10-CM | POA: Diagnosis not present

## 2022-08-03 DIAGNOSIS — M6281 Muscle weakness (generalized): Secondary | ICD-10-CM | POA: Diagnosis not present

## 2022-08-03 DIAGNOSIS — N183 Chronic kidney disease, stage 3 unspecified: Secondary | ICD-10-CM | POA: Diagnosis not present

## 2022-08-03 DIAGNOSIS — I2699 Other pulmonary embolism without acute cor pulmonale: Secondary | ICD-10-CM | POA: Diagnosis not present

## 2022-08-04 DIAGNOSIS — M6281 Muscle weakness (generalized): Secondary | ICD-10-CM | POA: Diagnosis not present

## 2022-08-04 DIAGNOSIS — I2699 Other pulmonary embolism without acute cor pulmonale: Secondary | ICD-10-CM | POA: Diagnosis not present

## 2022-08-04 DIAGNOSIS — N183 Chronic kidney disease, stage 3 unspecified: Secondary | ICD-10-CM | POA: Diagnosis not present

## 2022-08-04 DIAGNOSIS — M5136 Other intervertebral disc degeneration, lumbar region: Secondary | ICD-10-CM | POA: Diagnosis not present

## 2022-08-07 DIAGNOSIS — M5136 Other intervertebral disc degeneration, lumbar region: Secondary | ICD-10-CM | POA: Diagnosis not present

## 2022-08-07 DIAGNOSIS — N183 Chronic kidney disease, stage 3 unspecified: Secondary | ICD-10-CM | POA: Diagnosis not present

## 2022-08-07 DIAGNOSIS — I2699 Other pulmonary embolism without acute cor pulmonale: Secondary | ICD-10-CM | POA: Diagnosis not present

## 2022-08-07 DIAGNOSIS — M6281 Muscle weakness (generalized): Secondary | ICD-10-CM | POA: Diagnosis not present

## 2022-08-08 DIAGNOSIS — I2699 Other pulmonary embolism without acute cor pulmonale: Secondary | ICD-10-CM | POA: Diagnosis not present

## 2022-08-08 DIAGNOSIS — M6281 Muscle weakness (generalized): Secondary | ICD-10-CM | POA: Diagnosis not present

## 2022-08-08 DIAGNOSIS — N183 Chronic kidney disease, stage 3 unspecified: Secondary | ICD-10-CM | POA: Diagnosis not present

## 2022-08-08 DIAGNOSIS — M5136 Other intervertebral disc degeneration, lumbar region: Secondary | ICD-10-CM | POA: Diagnosis not present

## 2022-08-09 DIAGNOSIS — M5136 Other intervertebral disc degeneration, lumbar region: Secondary | ICD-10-CM | POA: Diagnosis not present

## 2022-08-09 DIAGNOSIS — N183 Chronic kidney disease, stage 3 unspecified: Secondary | ICD-10-CM | POA: Diagnosis not present

## 2022-08-09 DIAGNOSIS — I2699 Other pulmonary embolism without acute cor pulmonale: Secondary | ICD-10-CM | POA: Diagnosis not present

## 2022-08-09 DIAGNOSIS — M6281 Muscle weakness (generalized): Secondary | ICD-10-CM | POA: Diagnosis not present

## 2022-08-10 DIAGNOSIS — M5136 Other intervertebral disc degeneration, lumbar region: Secondary | ICD-10-CM | POA: Diagnosis not present

## 2022-08-10 DIAGNOSIS — M6281 Muscle weakness (generalized): Secondary | ICD-10-CM | POA: Diagnosis not present

## 2022-08-10 DIAGNOSIS — N183 Chronic kidney disease, stage 3 unspecified: Secondary | ICD-10-CM | POA: Diagnosis not present

## 2022-08-10 DIAGNOSIS — I2699 Other pulmonary embolism without acute cor pulmonale: Secondary | ICD-10-CM | POA: Diagnosis not present

## 2022-08-11 DIAGNOSIS — N183 Chronic kidney disease, stage 3 unspecified: Secondary | ICD-10-CM | POA: Diagnosis not present

## 2022-08-11 DIAGNOSIS — M6281 Muscle weakness (generalized): Secondary | ICD-10-CM | POA: Diagnosis not present

## 2022-08-11 DIAGNOSIS — M5136 Other intervertebral disc degeneration, lumbar region: Secondary | ICD-10-CM | POA: Diagnosis not present

## 2022-08-11 DIAGNOSIS — I2699 Other pulmonary embolism without acute cor pulmonale: Secondary | ICD-10-CM | POA: Diagnosis not present

## 2022-08-14 DIAGNOSIS — M6281 Muscle weakness (generalized): Secondary | ICD-10-CM | POA: Diagnosis not present

## 2022-08-14 DIAGNOSIS — M5136 Other intervertebral disc degeneration, lumbar region: Secondary | ICD-10-CM | POA: Diagnosis not present

## 2022-08-14 DIAGNOSIS — Z9181 History of falling: Secondary | ICD-10-CM | POA: Diagnosis not present

## 2022-08-14 DIAGNOSIS — I2699 Other pulmonary embolism without acute cor pulmonale: Secondary | ICD-10-CM | POA: Diagnosis not present

## 2022-08-14 DIAGNOSIS — N183 Chronic kidney disease, stage 3 unspecified: Secondary | ICD-10-CM | POA: Diagnosis not present

## 2022-08-15 DIAGNOSIS — I2699 Other pulmonary embolism without acute cor pulmonale: Secondary | ICD-10-CM | POA: Diagnosis not present

## 2022-08-15 DIAGNOSIS — H2513 Age-related nuclear cataract, bilateral: Secondary | ICD-10-CM | POA: Diagnosis not present

## 2022-08-15 DIAGNOSIS — H04123 Dry eye syndrome of bilateral lacrimal glands: Secondary | ICD-10-CM | POA: Diagnosis not present

## 2022-08-15 DIAGNOSIS — H35013 Changes in retinal vascular appearance, bilateral: Secondary | ICD-10-CM | POA: Diagnosis not present

## 2022-08-15 DIAGNOSIS — M5136 Other intervertebral disc degeneration, lumbar region: Secondary | ICD-10-CM | POA: Diagnosis not present

## 2022-08-15 DIAGNOSIS — M6281 Muscle weakness (generalized): Secondary | ICD-10-CM | POA: Diagnosis not present

## 2022-08-15 DIAGNOSIS — Z9181 History of falling: Secondary | ICD-10-CM | POA: Diagnosis not present

## 2022-08-15 DIAGNOSIS — N183 Chronic kidney disease, stage 3 unspecified: Secondary | ICD-10-CM | POA: Diagnosis not present

## 2022-08-16 DIAGNOSIS — M6281 Muscle weakness (generalized): Secondary | ICD-10-CM | POA: Diagnosis not present

## 2022-08-16 DIAGNOSIS — M5136 Other intervertebral disc degeneration, lumbar region: Secondary | ICD-10-CM | POA: Diagnosis not present

## 2022-08-16 DIAGNOSIS — N183 Chronic kidney disease, stage 3 unspecified: Secondary | ICD-10-CM | POA: Diagnosis not present

## 2022-08-16 DIAGNOSIS — Z9181 History of falling: Secondary | ICD-10-CM | POA: Diagnosis not present

## 2022-08-16 DIAGNOSIS — I2699 Other pulmonary embolism without acute cor pulmonale: Secondary | ICD-10-CM | POA: Diagnosis not present

## 2022-08-17 DIAGNOSIS — I2699 Other pulmonary embolism without acute cor pulmonale: Secondary | ICD-10-CM | POA: Diagnosis not present

## 2022-08-17 DIAGNOSIS — M6281 Muscle weakness (generalized): Secondary | ICD-10-CM | POA: Diagnosis not present

## 2022-08-17 DIAGNOSIS — Z9181 History of falling: Secondary | ICD-10-CM | POA: Diagnosis not present

## 2022-08-17 DIAGNOSIS — N183 Chronic kidney disease, stage 3 unspecified: Secondary | ICD-10-CM | POA: Diagnosis not present

## 2022-08-17 DIAGNOSIS — M5136 Other intervertebral disc degeneration, lumbar region: Secondary | ICD-10-CM | POA: Diagnosis not present

## 2022-08-18 DIAGNOSIS — M6281 Muscle weakness (generalized): Secondary | ICD-10-CM | POA: Diagnosis not present

## 2022-08-18 DIAGNOSIS — Z9181 History of falling: Secondary | ICD-10-CM | POA: Diagnosis not present

## 2022-08-18 DIAGNOSIS — N183 Chronic kidney disease, stage 3 unspecified: Secondary | ICD-10-CM | POA: Diagnosis not present

## 2022-08-18 DIAGNOSIS — I2699 Other pulmonary embolism without acute cor pulmonale: Secondary | ICD-10-CM | POA: Diagnosis not present

## 2022-08-18 DIAGNOSIS — M5136 Other intervertebral disc degeneration, lumbar region: Secondary | ICD-10-CM | POA: Diagnosis not present

## 2022-08-21 DIAGNOSIS — Z9181 History of falling: Secondary | ICD-10-CM | POA: Diagnosis not present

## 2022-08-21 DIAGNOSIS — N183 Chronic kidney disease, stage 3 unspecified: Secondary | ICD-10-CM | POA: Diagnosis not present

## 2022-08-21 DIAGNOSIS — M6281 Muscle weakness (generalized): Secondary | ICD-10-CM | POA: Diagnosis not present

## 2022-08-21 DIAGNOSIS — I2699 Other pulmonary embolism without acute cor pulmonale: Secondary | ICD-10-CM | POA: Diagnosis not present

## 2022-08-21 DIAGNOSIS — M5136 Other intervertebral disc degeneration, lumbar region: Secondary | ICD-10-CM | POA: Diagnosis not present

## 2022-08-22 DIAGNOSIS — M6281 Muscle weakness (generalized): Secondary | ICD-10-CM | POA: Diagnosis not present

## 2022-08-22 DIAGNOSIS — M5136 Other intervertebral disc degeneration, lumbar region: Secondary | ICD-10-CM | POA: Diagnosis not present

## 2022-08-22 DIAGNOSIS — I2699 Other pulmonary embolism without acute cor pulmonale: Secondary | ICD-10-CM | POA: Diagnosis not present

## 2022-08-22 DIAGNOSIS — Z9181 History of falling: Secondary | ICD-10-CM | POA: Diagnosis not present

## 2022-08-22 DIAGNOSIS — N183 Chronic kidney disease, stage 3 unspecified: Secondary | ICD-10-CM | POA: Diagnosis not present

## 2022-08-23 DIAGNOSIS — M5136 Other intervertebral disc degeneration, lumbar region: Secondary | ICD-10-CM | POA: Diagnosis not present

## 2022-08-23 DIAGNOSIS — Z9181 History of falling: Secondary | ICD-10-CM | POA: Diagnosis not present

## 2022-08-23 DIAGNOSIS — N183 Chronic kidney disease, stage 3 unspecified: Secondary | ICD-10-CM | POA: Diagnosis not present

## 2022-08-23 DIAGNOSIS — M6281 Muscle weakness (generalized): Secondary | ICD-10-CM | POA: Diagnosis not present

## 2022-08-23 DIAGNOSIS — I2699 Other pulmonary embolism without acute cor pulmonale: Secondary | ICD-10-CM | POA: Diagnosis not present

## 2022-08-24 DIAGNOSIS — M6281 Muscle weakness (generalized): Secondary | ICD-10-CM | POA: Diagnosis not present

## 2022-08-24 DIAGNOSIS — M5136 Other intervertebral disc degeneration, lumbar region: Secondary | ICD-10-CM | POA: Diagnosis not present

## 2022-08-24 DIAGNOSIS — I2699 Other pulmonary embolism without acute cor pulmonale: Secondary | ICD-10-CM | POA: Diagnosis not present

## 2022-08-24 DIAGNOSIS — Z9181 History of falling: Secondary | ICD-10-CM | POA: Diagnosis not present

## 2022-08-24 DIAGNOSIS — N183 Chronic kidney disease, stage 3 unspecified: Secondary | ICD-10-CM | POA: Diagnosis not present

## 2022-08-25 DIAGNOSIS — N183 Chronic kidney disease, stage 3 unspecified: Secondary | ICD-10-CM | POA: Diagnosis not present

## 2022-08-25 DIAGNOSIS — Z9181 History of falling: Secondary | ICD-10-CM | POA: Diagnosis not present

## 2022-08-25 DIAGNOSIS — I2699 Other pulmonary embolism without acute cor pulmonale: Secondary | ICD-10-CM | POA: Diagnosis not present

## 2022-08-25 DIAGNOSIS — M5136 Other intervertebral disc degeneration, lumbar region: Secondary | ICD-10-CM | POA: Diagnosis not present

## 2022-08-25 DIAGNOSIS — M6281 Muscle weakness (generalized): Secondary | ICD-10-CM | POA: Diagnosis not present

## 2022-08-28 DIAGNOSIS — N183 Chronic kidney disease, stage 3 unspecified: Secondary | ICD-10-CM | POA: Diagnosis not present

## 2022-08-28 DIAGNOSIS — M6281 Muscle weakness (generalized): Secondary | ICD-10-CM | POA: Diagnosis not present

## 2022-08-28 DIAGNOSIS — I2699 Other pulmonary embolism without acute cor pulmonale: Secondary | ICD-10-CM | POA: Diagnosis not present

## 2022-08-28 DIAGNOSIS — Z9181 History of falling: Secondary | ICD-10-CM | POA: Diagnosis not present

## 2022-08-28 DIAGNOSIS — M5136 Other intervertebral disc degeneration, lumbar region: Secondary | ICD-10-CM | POA: Diagnosis not present

## 2022-08-29 DIAGNOSIS — N183 Chronic kidney disease, stage 3 unspecified: Secondary | ICD-10-CM | POA: Diagnosis not present

## 2022-08-29 DIAGNOSIS — Z9181 History of falling: Secondary | ICD-10-CM | POA: Diagnosis not present

## 2022-08-29 DIAGNOSIS — I2699 Other pulmonary embolism without acute cor pulmonale: Secondary | ICD-10-CM | POA: Diagnosis not present

## 2022-08-29 DIAGNOSIS — M5136 Other intervertebral disc degeneration, lumbar region: Secondary | ICD-10-CM | POA: Diagnosis not present

## 2022-08-29 DIAGNOSIS — M6281 Muscle weakness (generalized): Secondary | ICD-10-CM | POA: Diagnosis not present

## 2022-08-30 DIAGNOSIS — M6281 Muscle weakness (generalized): Secondary | ICD-10-CM | POA: Diagnosis not present

## 2022-08-30 DIAGNOSIS — I2699 Other pulmonary embolism without acute cor pulmonale: Secondary | ICD-10-CM | POA: Diagnosis not present

## 2022-08-30 DIAGNOSIS — N183 Chronic kidney disease, stage 3 unspecified: Secondary | ICD-10-CM | POA: Diagnosis not present

## 2022-08-30 DIAGNOSIS — Z9181 History of falling: Secondary | ICD-10-CM | POA: Diagnosis not present

## 2022-08-30 DIAGNOSIS — M5136 Other intervertebral disc degeneration, lumbar region: Secondary | ICD-10-CM | POA: Diagnosis not present

## 2022-08-31 DIAGNOSIS — I2699 Other pulmonary embolism without acute cor pulmonale: Secondary | ICD-10-CM | POA: Diagnosis not present

## 2022-08-31 DIAGNOSIS — Z9181 History of falling: Secondary | ICD-10-CM | POA: Diagnosis not present

## 2022-08-31 DIAGNOSIS — M5136 Other intervertebral disc degeneration, lumbar region: Secondary | ICD-10-CM | POA: Diagnosis not present

## 2022-08-31 DIAGNOSIS — N183 Chronic kidney disease, stage 3 unspecified: Secondary | ICD-10-CM | POA: Diagnosis not present

## 2022-08-31 DIAGNOSIS — M6281 Muscle weakness (generalized): Secondary | ICD-10-CM | POA: Diagnosis not present

## 2022-09-01 DIAGNOSIS — I2699 Other pulmonary embolism without acute cor pulmonale: Secondary | ICD-10-CM | POA: Diagnosis not present

## 2022-09-01 DIAGNOSIS — N183 Chronic kidney disease, stage 3 unspecified: Secondary | ICD-10-CM | POA: Diagnosis not present

## 2022-09-01 DIAGNOSIS — M5136 Other intervertebral disc degeneration, lumbar region: Secondary | ICD-10-CM | POA: Diagnosis not present

## 2022-09-01 DIAGNOSIS — Z9181 History of falling: Secondary | ICD-10-CM | POA: Diagnosis not present

## 2022-09-01 DIAGNOSIS — M6281 Muscle weakness (generalized): Secondary | ICD-10-CM | POA: Diagnosis not present

## 2022-09-03 DIAGNOSIS — I2699 Other pulmonary embolism without acute cor pulmonale: Secondary | ICD-10-CM | POA: Diagnosis not present

## 2022-09-03 DIAGNOSIS — M5136 Other intervertebral disc degeneration, lumbar region: Secondary | ICD-10-CM | POA: Diagnosis not present

## 2022-09-03 DIAGNOSIS — Z9181 History of falling: Secondary | ICD-10-CM | POA: Diagnosis not present

## 2022-09-03 DIAGNOSIS — M6281 Muscle weakness (generalized): Secondary | ICD-10-CM | POA: Diagnosis not present

## 2022-09-03 DIAGNOSIS — N183 Chronic kidney disease, stage 3 unspecified: Secondary | ICD-10-CM | POA: Diagnosis not present

## 2022-09-05 DIAGNOSIS — I2699 Other pulmonary embolism without acute cor pulmonale: Secondary | ICD-10-CM | POA: Diagnosis not present

## 2022-09-05 DIAGNOSIS — N183 Chronic kidney disease, stage 3 unspecified: Secondary | ICD-10-CM | POA: Diagnosis not present

## 2022-09-05 DIAGNOSIS — M5136 Other intervertebral disc degeneration, lumbar region: Secondary | ICD-10-CM | POA: Diagnosis not present

## 2022-09-05 DIAGNOSIS — Z9181 History of falling: Secondary | ICD-10-CM | POA: Diagnosis not present

## 2022-09-05 DIAGNOSIS — M6281 Muscle weakness (generalized): Secondary | ICD-10-CM | POA: Diagnosis not present

## 2022-09-06 DIAGNOSIS — Z9181 History of falling: Secondary | ICD-10-CM | POA: Diagnosis not present

## 2022-09-06 DIAGNOSIS — M5136 Other intervertebral disc degeneration, lumbar region: Secondary | ICD-10-CM | POA: Diagnosis not present

## 2022-09-06 DIAGNOSIS — M6281 Muscle weakness (generalized): Secondary | ICD-10-CM | POA: Diagnosis not present

## 2022-09-06 DIAGNOSIS — N183 Chronic kidney disease, stage 3 unspecified: Secondary | ICD-10-CM | POA: Diagnosis not present

## 2022-09-06 DIAGNOSIS — I2699 Other pulmonary embolism without acute cor pulmonale: Secondary | ICD-10-CM | POA: Diagnosis not present

## 2022-09-07 DIAGNOSIS — Z9181 History of falling: Secondary | ICD-10-CM | POA: Diagnosis not present

## 2022-09-07 DIAGNOSIS — M6281 Muscle weakness (generalized): Secondary | ICD-10-CM | POA: Diagnosis not present

## 2022-09-07 DIAGNOSIS — I2699 Other pulmonary embolism without acute cor pulmonale: Secondary | ICD-10-CM | POA: Diagnosis not present

## 2022-09-07 DIAGNOSIS — M5136 Other intervertebral disc degeneration, lumbar region: Secondary | ICD-10-CM | POA: Diagnosis not present

## 2022-09-07 DIAGNOSIS — N183 Chronic kidney disease, stage 3 unspecified: Secondary | ICD-10-CM | POA: Diagnosis not present

## 2022-09-08 DIAGNOSIS — Z9181 History of falling: Secondary | ICD-10-CM | POA: Diagnosis not present

## 2022-09-08 DIAGNOSIS — M6281 Muscle weakness (generalized): Secondary | ICD-10-CM | POA: Diagnosis not present

## 2022-09-08 DIAGNOSIS — N183 Chronic kidney disease, stage 3 unspecified: Secondary | ICD-10-CM | POA: Diagnosis not present

## 2022-09-08 DIAGNOSIS — I2699 Other pulmonary embolism without acute cor pulmonale: Secondary | ICD-10-CM | POA: Diagnosis not present

## 2022-09-08 DIAGNOSIS — M5136 Other intervertebral disc degeneration, lumbar region: Secondary | ICD-10-CM | POA: Diagnosis not present

## 2022-09-11 DIAGNOSIS — M6281 Muscle weakness (generalized): Secondary | ICD-10-CM | POA: Diagnosis not present

## 2022-09-11 DIAGNOSIS — M5136 Other intervertebral disc degeneration, lumbar region: Secondary | ICD-10-CM | POA: Diagnosis not present

## 2022-09-11 DIAGNOSIS — Z9181 History of falling: Secondary | ICD-10-CM | POA: Diagnosis not present

## 2022-09-11 DIAGNOSIS — N183 Chronic kidney disease, stage 3 unspecified: Secondary | ICD-10-CM | POA: Diagnosis not present

## 2022-09-11 DIAGNOSIS — I2699 Other pulmonary embolism without acute cor pulmonale: Secondary | ICD-10-CM | POA: Diagnosis not present

## 2022-09-25 DIAGNOSIS — D6859 Other primary thrombophilia: Secondary | ICD-10-CM | POA: Diagnosis not present

## 2022-09-25 DIAGNOSIS — Z8673 Personal history of transient ischemic attack (TIA), and cerebral infarction without residual deficits: Secondary | ICD-10-CM | POA: Diagnosis not present

## 2022-09-25 DIAGNOSIS — N1831 Chronic kidney disease, stage 3a: Secondary | ICD-10-CM | POA: Diagnosis not present

## 2022-09-25 DIAGNOSIS — I5040 Unspecified combined systolic (congestive) and diastolic (congestive) heart failure: Secondary | ICD-10-CM | POA: Diagnosis not present

## 2022-09-25 DIAGNOSIS — M1990 Unspecified osteoarthritis, unspecified site: Secondary | ICD-10-CM | POA: Diagnosis not present

## 2022-09-25 DIAGNOSIS — Z7409 Other reduced mobility: Secondary | ICD-10-CM | POA: Diagnosis not present

## 2022-09-25 DIAGNOSIS — R6889 Other general symptoms and signs: Secondary | ICD-10-CM | POA: Diagnosis not present

## 2022-09-25 DIAGNOSIS — I130 Hypertensive heart and chronic kidney disease with heart failure and stage 1 through stage 4 chronic kidney disease, or unspecified chronic kidney disease: Secondary | ICD-10-CM | POA: Diagnosis not present

## 2022-09-25 DIAGNOSIS — I714 Abdominal aortic aneurysm, without rupture, unspecified: Secondary | ICD-10-CM | POA: Diagnosis not present

## 2022-09-27 DIAGNOSIS — D649 Anemia, unspecified: Secondary | ICD-10-CM | POA: Diagnosis not present

## 2022-09-27 DIAGNOSIS — I10 Essential (primary) hypertension: Secondary | ICD-10-CM | POA: Diagnosis not present

## 2022-09-27 DIAGNOSIS — E785 Hyperlipidemia, unspecified: Secondary | ICD-10-CM | POA: Diagnosis not present

## 2022-09-27 DIAGNOSIS — D519 Vitamin B12 deficiency anemia, unspecified: Secondary | ICD-10-CM | POA: Diagnosis not present

## 2022-09-28 DIAGNOSIS — I10 Essential (primary) hypertension: Secondary | ICD-10-CM | POA: Diagnosis not present

## 2022-10-02 DIAGNOSIS — D696 Thrombocytopenia, unspecified: Secondary | ICD-10-CM | POA: Diagnosis not present

## 2022-10-02 DIAGNOSIS — Z789 Other specified health status: Secondary | ICD-10-CM | POA: Diagnosis not present

## 2022-10-02 DIAGNOSIS — H04129 Dry eye syndrome of unspecified lacrimal gland: Secondary | ICD-10-CM | POA: Diagnosis not present

## 2022-10-02 DIAGNOSIS — H527 Unspecified disorder of refraction: Secondary | ICD-10-CM | POA: Diagnosis not present

## 2022-10-03 NOTE — Telephone Encounter (Signed)
This encounter was created in error - please disregard.

## 2022-10-10 DIAGNOSIS — I69811 Memory deficit following other cerebrovascular disease: Secondary | ICD-10-CM | POA: Diagnosis not present

## 2022-10-10 DIAGNOSIS — F01A18 Vascular dementia, mild, with other behavioral disturbance: Secondary | ICD-10-CM | POA: Diagnosis not present

## 2022-10-10 DIAGNOSIS — F331 Major depressive disorder, recurrent, moderate: Secondary | ICD-10-CM | POA: Diagnosis not present

## 2022-10-25 DIAGNOSIS — H35013 Changes in retinal vascular appearance, bilateral: Secondary | ICD-10-CM | POA: Diagnosis not present

## 2022-12-03 DIAGNOSIS — M6281 Muscle weakness (generalized): Secondary | ICD-10-CM | POA: Diagnosis not present

## 2022-12-03 DIAGNOSIS — R2681 Unsteadiness on feet: Secondary | ICD-10-CM | POA: Diagnosis not present

## 2022-12-03 DIAGNOSIS — R1311 Dysphagia, oral phase: Secondary | ICD-10-CM | POA: Diagnosis not present

## 2022-12-03 DIAGNOSIS — R279 Unspecified lack of coordination: Secondary | ICD-10-CM | POA: Diagnosis not present

## 2022-12-03 DIAGNOSIS — I2699 Other pulmonary embolism without acute cor pulmonale: Secondary | ICD-10-CM | POA: Diagnosis not present

## 2022-12-05 DIAGNOSIS — M6281 Muscle weakness (generalized): Secondary | ICD-10-CM | POA: Diagnosis not present

## 2022-12-05 DIAGNOSIS — R2681 Unsteadiness on feet: Secondary | ICD-10-CM | POA: Diagnosis not present

## 2022-12-05 DIAGNOSIS — R279 Unspecified lack of coordination: Secondary | ICD-10-CM | POA: Diagnosis not present

## 2022-12-05 DIAGNOSIS — I2699 Other pulmonary embolism without acute cor pulmonale: Secondary | ICD-10-CM | POA: Diagnosis not present

## 2022-12-05 DIAGNOSIS — R1311 Dysphagia, oral phase: Secondary | ICD-10-CM | POA: Diagnosis not present

## 2022-12-06 DIAGNOSIS — R279 Unspecified lack of coordination: Secondary | ICD-10-CM | POA: Diagnosis not present

## 2022-12-06 DIAGNOSIS — I2699 Other pulmonary embolism without acute cor pulmonale: Secondary | ICD-10-CM | POA: Diagnosis not present

## 2022-12-06 DIAGNOSIS — M6281 Muscle weakness (generalized): Secondary | ICD-10-CM | POA: Diagnosis not present

## 2022-12-06 DIAGNOSIS — R1311 Dysphagia, oral phase: Secondary | ICD-10-CM | POA: Diagnosis not present

## 2022-12-06 DIAGNOSIS — R2681 Unsteadiness on feet: Secondary | ICD-10-CM | POA: Diagnosis not present

## 2022-12-07 DIAGNOSIS — I2699 Other pulmonary embolism without acute cor pulmonale: Secondary | ICD-10-CM | POA: Diagnosis not present

## 2022-12-07 DIAGNOSIS — R279 Unspecified lack of coordination: Secondary | ICD-10-CM | POA: Diagnosis not present

## 2022-12-07 DIAGNOSIS — R2681 Unsteadiness on feet: Secondary | ICD-10-CM | POA: Diagnosis not present

## 2022-12-07 DIAGNOSIS — M6281 Muscle weakness (generalized): Secondary | ICD-10-CM | POA: Diagnosis not present

## 2022-12-07 DIAGNOSIS — R1311 Dysphagia, oral phase: Secondary | ICD-10-CM | POA: Diagnosis not present

## 2022-12-08 DIAGNOSIS — I2699 Other pulmonary embolism without acute cor pulmonale: Secondary | ICD-10-CM | POA: Diagnosis not present

## 2022-12-08 DIAGNOSIS — R1311 Dysphagia, oral phase: Secondary | ICD-10-CM | POA: Diagnosis not present

## 2022-12-08 DIAGNOSIS — R279 Unspecified lack of coordination: Secondary | ICD-10-CM | POA: Diagnosis not present

## 2022-12-08 DIAGNOSIS — M6281 Muscle weakness (generalized): Secondary | ICD-10-CM | POA: Diagnosis not present

## 2022-12-08 DIAGNOSIS — R2681 Unsteadiness on feet: Secondary | ICD-10-CM | POA: Diagnosis not present

## 2022-12-10 DIAGNOSIS — M6281 Muscle weakness (generalized): Secondary | ICD-10-CM | POA: Diagnosis not present

## 2022-12-10 DIAGNOSIS — R2681 Unsteadiness on feet: Secondary | ICD-10-CM | POA: Diagnosis not present

## 2022-12-10 DIAGNOSIS — I2699 Other pulmonary embolism without acute cor pulmonale: Secondary | ICD-10-CM | POA: Diagnosis not present

## 2022-12-10 DIAGNOSIS — R1311 Dysphagia, oral phase: Secondary | ICD-10-CM | POA: Diagnosis not present

## 2022-12-10 DIAGNOSIS — R279 Unspecified lack of coordination: Secondary | ICD-10-CM | POA: Diagnosis not present

## 2022-12-11 DIAGNOSIS — R2681 Unsteadiness on feet: Secondary | ICD-10-CM | POA: Diagnosis not present

## 2022-12-11 DIAGNOSIS — R279 Unspecified lack of coordination: Secondary | ICD-10-CM | POA: Diagnosis not present

## 2022-12-11 DIAGNOSIS — I2699 Other pulmonary embolism without acute cor pulmonale: Secondary | ICD-10-CM | POA: Diagnosis not present

## 2022-12-11 DIAGNOSIS — M6281 Muscle weakness (generalized): Secondary | ICD-10-CM | POA: Diagnosis not present

## 2022-12-11 DIAGNOSIS — R1311 Dysphagia, oral phase: Secondary | ICD-10-CM | POA: Diagnosis not present

## 2022-12-13 DIAGNOSIS — M6281 Muscle weakness (generalized): Secondary | ICD-10-CM | POA: Diagnosis not present

## 2022-12-13 DIAGNOSIS — I2699 Other pulmonary embolism without acute cor pulmonale: Secondary | ICD-10-CM | POA: Diagnosis not present

## 2022-12-13 DIAGNOSIS — R2681 Unsteadiness on feet: Secondary | ICD-10-CM | POA: Diagnosis not present

## 2022-12-13 DIAGNOSIS — R279 Unspecified lack of coordination: Secondary | ICD-10-CM | POA: Diagnosis not present

## 2022-12-13 DIAGNOSIS — R1311 Dysphagia, oral phase: Secondary | ICD-10-CM | POA: Diagnosis not present

## 2022-12-14 DIAGNOSIS — R1311 Dysphagia, oral phase: Secondary | ICD-10-CM | POA: Diagnosis not present

## 2022-12-14 DIAGNOSIS — M6281 Muscle weakness (generalized): Secondary | ICD-10-CM | POA: Diagnosis not present

## 2022-12-14 DIAGNOSIS — I2699 Other pulmonary embolism without acute cor pulmonale: Secondary | ICD-10-CM | POA: Diagnosis not present

## 2022-12-14 DIAGNOSIS — R279 Unspecified lack of coordination: Secondary | ICD-10-CM | POA: Diagnosis not present

## 2022-12-14 DIAGNOSIS — R2681 Unsteadiness on feet: Secondary | ICD-10-CM | POA: Diagnosis not present

## 2022-12-15 DIAGNOSIS — R1311 Dysphagia, oral phase: Secondary | ICD-10-CM | POA: Diagnosis not present

## 2022-12-15 DIAGNOSIS — I2699 Other pulmonary embolism without acute cor pulmonale: Secondary | ICD-10-CM | POA: Diagnosis not present

## 2022-12-15 DIAGNOSIS — R279 Unspecified lack of coordination: Secondary | ICD-10-CM | POA: Diagnosis not present

## 2022-12-15 DIAGNOSIS — R2681 Unsteadiness on feet: Secondary | ICD-10-CM | POA: Diagnosis not present

## 2022-12-15 DIAGNOSIS — M6281 Muscle weakness (generalized): Secondary | ICD-10-CM | POA: Diagnosis not present

## 2022-12-18 DIAGNOSIS — M6281 Muscle weakness (generalized): Secondary | ICD-10-CM | POA: Diagnosis not present

## 2022-12-18 DIAGNOSIS — R2681 Unsteadiness on feet: Secondary | ICD-10-CM | POA: Diagnosis not present

## 2022-12-18 DIAGNOSIS — R1311 Dysphagia, oral phase: Secondary | ICD-10-CM | POA: Diagnosis not present

## 2022-12-18 DIAGNOSIS — I2699 Other pulmonary embolism without acute cor pulmonale: Secondary | ICD-10-CM | POA: Diagnosis not present

## 2022-12-18 DIAGNOSIS — R279 Unspecified lack of coordination: Secondary | ICD-10-CM | POA: Diagnosis not present

## 2022-12-19 DIAGNOSIS — R1311 Dysphagia, oral phase: Secondary | ICD-10-CM | POA: Diagnosis not present

## 2022-12-19 DIAGNOSIS — R2681 Unsteadiness on feet: Secondary | ICD-10-CM | POA: Diagnosis not present

## 2022-12-19 DIAGNOSIS — M6281 Muscle weakness (generalized): Secondary | ICD-10-CM | POA: Diagnosis not present

## 2022-12-19 DIAGNOSIS — R279 Unspecified lack of coordination: Secondary | ICD-10-CM | POA: Diagnosis not present

## 2022-12-19 DIAGNOSIS — I2699 Other pulmonary embolism without acute cor pulmonale: Secondary | ICD-10-CM | POA: Diagnosis not present

## 2022-12-20 DIAGNOSIS — I2699 Other pulmonary embolism without acute cor pulmonale: Secondary | ICD-10-CM | POA: Diagnosis not present

## 2022-12-20 DIAGNOSIS — R279 Unspecified lack of coordination: Secondary | ICD-10-CM | POA: Diagnosis not present

## 2022-12-20 DIAGNOSIS — R1311 Dysphagia, oral phase: Secondary | ICD-10-CM | POA: Diagnosis not present

## 2022-12-20 DIAGNOSIS — R2681 Unsteadiness on feet: Secondary | ICD-10-CM | POA: Diagnosis not present

## 2022-12-20 DIAGNOSIS — M6281 Muscle weakness (generalized): Secondary | ICD-10-CM | POA: Diagnosis not present

## 2022-12-21 DIAGNOSIS — R279 Unspecified lack of coordination: Secondary | ICD-10-CM | POA: Diagnosis not present

## 2022-12-21 DIAGNOSIS — R1311 Dysphagia, oral phase: Secondary | ICD-10-CM | POA: Diagnosis not present

## 2022-12-21 DIAGNOSIS — I2699 Other pulmonary embolism without acute cor pulmonale: Secondary | ICD-10-CM | POA: Diagnosis not present

## 2022-12-21 DIAGNOSIS — R2681 Unsteadiness on feet: Secondary | ICD-10-CM | POA: Diagnosis not present

## 2022-12-21 DIAGNOSIS — M6281 Muscle weakness (generalized): Secondary | ICD-10-CM | POA: Diagnosis not present

## 2022-12-22 DIAGNOSIS — R1311 Dysphagia, oral phase: Secondary | ICD-10-CM | POA: Diagnosis not present

## 2022-12-22 DIAGNOSIS — R2681 Unsteadiness on feet: Secondary | ICD-10-CM | POA: Diagnosis not present

## 2022-12-22 DIAGNOSIS — M6281 Muscle weakness (generalized): Secondary | ICD-10-CM | POA: Diagnosis not present

## 2022-12-22 DIAGNOSIS — R279 Unspecified lack of coordination: Secondary | ICD-10-CM | POA: Diagnosis not present

## 2022-12-22 DIAGNOSIS — I2699 Other pulmonary embolism without acute cor pulmonale: Secondary | ICD-10-CM | POA: Diagnosis not present

## 2022-12-23 DIAGNOSIS — R2681 Unsteadiness on feet: Secondary | ICD-10-CM | POA: Diagnosis not present

## 2022-12-23 DIAGNOSIS — I2699 Other pulmonary embolism without acute cor pulmonale: Secondary | ICD-10-CM | POA: Diagnosis not present

## 2022-12-23 DIAGNOSIS — R1311 Dysphagia, oral phase: Secondary | ICD-10-CM | POA: Diagnosis not present

## 2022-12-23 DIAGNOSIS — M6281 Muscle weakness (generalized): Secondary | ICD-10-CM | POA: Diagnosis not present

## 2022-12-23 DIAGNOSIS — R279 Unspecified lack of coordination: Secondary | ICD-10-CM | POA: Diagnosis not present

## 2022-12-25 DIAGNOSIS — R279 Unspecified lack of coordination: Secondary | ICD-10-CM | POA: Diagnosis not present

## 2022-12-25 DIAGNOSIS — R2681 Unsteadiness on feet: Secondary | ICD-10-CM | POA: Diagnosis not present

## 2022-12-25 DIAGNOSIS — M6281 Muscle weakness (generalized): Secondary | ICD-10-CM | POA: Diagnosis not present

## 2022-12-25 DIAGNOSIS — R1311 Dysphagia, oral phase: Secondary | ICD-10-CM | POA: Diagnosis not present

## 2022-12-25 DIAGNOSIS — I2699 Other pulmonary embolism without acute cor pulmonale: Secondary | ICD-10-CM | POA: Diagnosis not present

## 2022-12-26 DIAGNOSIS — R279 Unspecified lack of coordination: Secondary | ICD-10-CM | POA: Diagnosis not present

## 2022-12-26 DIAGNOSIS — I2699 Other pulmonary embolism without acute cor pulmonale: Secondary | ICD-10-CM | POA: Diagnosis not present

## 2022-12-26 DIAGNOSIS — R1311 Dysphagia, oral phase: Secondary | ICD-10-CM | POA: Diagnosis not present

## 2022-12-26 DIAGNOSIS — R2681 Unsteadiness on feet: Secondary | ICD-10-CM | POA: Diagnosis not present

## 2022-12-26 DIAGNOSIS — M6281 Muscle weakness (generalized): Secondary | ICD-10-CM | POA: Diagnosis not present

## 2022-12-27 DIAGNOSIS — M6281 Muscle weakness (generalized): Secondary | ICD-10-CM | POA: Diagnosis not present

## 2022-12-27 DIAGNOSIS — R279 Unspecified lack of coordination: Secondary | ICD-10-CM | POA: Diagnosis not present

## 2022-12-27 DIAGNOSIS — I2699 Other pulmonary embolism without acute cor pulmonale: Secondary | ICD-10-CM | POA: Diagnosis not present

## 2022-12-27 DIAGNOSIS — R1311 Dysphagia, oral phase: Secondary | ICD-10-CM | POA: Diagnosis not present

## 2022-12-27 DIAGNOSIS — R2681 Unsteadiness on feet: Secondary | ICD-10-CM | POA: Diagnosis not present

## 2022-12-28 DIAGNOSIS — M6281 Muscle weakness (generalized): Secondary | ICD-10-CM | POA: Diagnosis not present

## 2022-12-28 DIAGNOSIS — R1311 Dysphagia, oral phase: Secondary | ICD-10-CM | POA: Diagnosis not present

## 2022-12-28 DIAGNOSIS — I2699 Other pulmonary embolism without acute cor pulmonale: Secondary | ICD-10-CM | POA: Diagnosis not present

## 2022-12-28 DIAGNOSIS — R279 Unspecified lack of coordination: Secondary | ICD-10-CM | POA: Diagnosis not present

## 2022-12-28 DIAGNOSIS — R2681 Unsteadiness on feet: Secondary | ICD-10-CM | POA: Diagnosis not present

## 2022-12-29 DIAGNOSIS — M6281 Muscle weakness (generalized): Secondary | ICD-10-CM | POA: Diagnosis not present

## 2022-12-29 DIAGNOSIS — R2681 Unsteadiness on feet: Secondary | ICD-10-CM | POA: Diagnosis not present

## 2022-12-29 DIAGNOSIS — R279 Unspecified lack of coordination: Secondary | ICD-10-CM | POA: Diagnosis not present

## 2022-12-29 DIAGNOSIS — I2699 Other pulmonary embolism without acute cor pulmonale: Secondary | ICD-10-CM | POA: Diagnosis not present

## 2022-12-29 DIAGNOSIS — R1311 Dysphagia, oral phase: Secondary | ICD-10-CM | POA: Diagnosis not present

## 2023-03-05 DIAGNOSIS — I2699 Other pulmonary embolism without acute cor pulmonale: Secondary | ICD-10-CM | POA: Diagnosis not present

## 2023-03-05 DIAGNOSIS — R1311 Dysphagia, oral phase: Secondary | ICD-10-CM | POA: Diagnosis not present

## 2023-03-05 DIAGNOSIS — R279 Unspecified lack of coordination: Secondary | ICD-10-CM | POA: Diagnosis not present

## 2023-03-05 DIAGNOSIS — R2681 Unsteadiness on feet: Secondary | ICD-10-CM | POA: Diagnosis not present

## 2023-03-05 DIAGNOSIS — M6281 Muscle weakness (generalized): Secondary | ICD-10-CM | POA: Diagnosis not present

## 2023-03-06 DIAGNOSIS — M6281 Muscle weakness (generalized): Secondary | ICD-10-CM | POA: Diagnosis not present

## 2023-03-06 DIAGNOSIS — R2681 Unsteadiness on feet: Secondary | ICD-10-CM | POA: Diagnosis not present

## 2023-03-06 DIAGNOSIS — R1311 Dysphagia, oral phase: Secondary | ICD-10-CM | POA: Diagnosis not present

## 2023-03-06 DIAGNOSIS — R279 Unspecified lack of coordination: Secondary | ICD-10-CM | POA: Diagnosis not present

## 2023-03-06 DIAGNOSIS — I2699 Other pulmonary embolism without acute cor pulmonale: Secondary | ICD-10-CM | POA: Diagnosis not present

## 2023-03-07 DIAGNOSIS — R2681 Unsteadiness on feet: Secondary | ICD-10-CM | POA: Diagnosis not present

## 2023-03-07 DIAGNOSIS — M6281 Muscle weakness (generalized): Secondary | ICD-10-CM | POA: Diagnosis not present

## 2023-03-07 DIAGNOSIS — R1311 Dysphagia, oral phase: Secondary | ICD-10-CM | POA: Diagnosis not present

## 2023-03-07 DIAGNOSIS — I2699 Other pulmonary embolism without acute cor pulmonale: Secondary | ICD-10-CM | POA: Diagnosis not present

## 2023-03-07 DIAGNOSIS — R279 Unspecified lack of coordination: Secondary | ICD-10-CM | POA: Diagnosis not present

## 2023-03-08 DIAGNOSIS — M6281 Muscle weakness (generalized): Secondary | ICD-10-CM | POA: Diagnosis not present

## 2023-03-08 DIAGNOSIS — I2699 Other pulmonary embolism without acute cor pulmonale: Secondary | ICD-10-CM | POA: Diagnosis not present

## 2023-03-08 DIAGNOSIS — R279 Unspecified lack of coordination: Secondary | ICD-10-CM | POA: Diagnosis not present

## 2023-03-08 DIAGNOSIS — R1311 Dysphagia, oral phase: Secondary | ICD-10-CM | POA: Diagnosis not present

## 2023-03-08 DIAGNOSIS — R2681 Unsteadiness on feet: Secondary | ICD-10-CM | POA: Diagnosis not present

## 2023-03-09 DIAGNOSIS — I2699 Other pulmonary embolism without acute cor pulmonale: Secondary | ICD-10-CM | POA: Diagnosis not present

## 2023-03-09 DIAGNOSIS — R1311 Dysphagia, oral phase: Secondary | ICD-10-CM | POA: Diagnosis not present

## 2023-03-09 DIAGNOSIS — M6281 Muscle weakness (generalized): Secondary | ICD-10-CM | POA: Diagnosis not present

## 2023-03-09 DIAGNOSIS — R279 Unspecified lack of coordination: Secondary | ICD-10-CM | POA: Diagnosis not present

## 2023-03-09 DIAGNOSIS — R2681 Unsteadiness on feet: Secondary | ICD-10-CM | POA: Diagnosis not present

## 2023-03-12 DIAGNOSIS — R279 Unspecified lack of coordination: Secondary | ICD-10-CM | POA: Diagnosis not present

## 2023-03-12 DIAGNOSIS — I2699 Other pulmonary embolism without acute cor pulmonale: Secondary | ICD-10-CM | POA: Diagnosis not present

## 2023-03-12 DIAGNOSIS — M6281 Muscle weakness (generalized): Secondary | ICD-10-CM | POA: Diagnosis not present

## 2023-03-12 DIAGNOSIS — R2681 Unsteadiness on feet: Secondary | ICD-10-CM | POA: Diagnosis not present

## 2023-03-12 DIAGNOSIS — R1311 Dysphagia, oral phase: Secondary | ICD-10-CM | POA: Diagnosis not present

## 2023-03-13 DIAGNOSIS — M6281 Muscle weakness (generalized): Secondary | ICD-10-CM | POA: Diagnosis not present

## 2023-03-13 DIAGNOSIS — I2699 Other pulmonary embolism without acute cor pulmonale: Secondary | ICD-10-CM | POA: Diagnosis not present

## 2023-03-13 DIAGNOSIS — R2681 Unsteadiness on feet: Secondary | ICD-10-CM | POA: Diagnosis not present

## 2023-03-13 DIAGNOSIS — R1311 Dysphagia, oral phase: Secondary | ICD-10-CM | POA: Diagnosis not present

## 2023-03-13 DIAGNOSIS — R279 Unspecified lack of coordination: Secondary | ICD-10-CM | POA: Diagnosis not present

## 2023-03-14 DIAGNOSIS — I2699 Other pulmonary embolism without acute cor pulmonale: Secondary | ICD-10-CM | POA: Diagnosis not present

## 2023-03-14 DIAGNOSIS — M6281 Muscle weakness (generalized): Secondary | ICD-10-CM | POA: Diagnosis not present

## 2023-03-14 DIAGNOSIS — R2681 Unsteadiness on feet: Secondary | ICD-10-CM | POA: Diagnosis not present

## 2023-03-14 DIAGNOSIS — R1311 Dysphagia, oral phase: Secondary | ICD-10-CM | POA: Diagnosis not present

## 2023-03-14 DIAGNOSIS — R279 Unspecified lack of coordination: Secondary | ICD-10-CM | POA: Diagnosis not present

## 2023-03-15 DIAGNOSIS — M6281 Muscle weakness (generalized): Secondary | ICD-10-CM | POA: Diagnosis not present

## 2023-03-15 DIAGNOSIS — R1311 Dysphagia, oral phase: Secondary | ICD-10-CM | POA: Diagnosis not present

## 2023-03-15 DIAGNOSIS — R2681 Unsteadiness on feet: Secondary | ICD-10-CM | POA: Diagnosis not present

## 2023-03-15 DIAGNOSIS — I2699 Other pulmonary embolism without acute cor pulmonale: Secondary | ICD-10-CM | POA: Diagnosis not present

## 2023-03-15 DIAGNOSIS — R279 Unspecified lack of coordination: Secondary | ICD-10-CM | POA: Diagnosis not present

## 2023-03-16 DIAGNOSIS — M6281 Muscle weakness (generalized): Secondary | ICD-10-CM | POA: Diagnosis not present

## 2023-03-16 DIAGNOSIS — I2699 Other pulmonary embolism without acute cor pulmonale: Secondary | ICD-10-CM | POA: Diagnosis not present

## 2023-03-19 DIAGNOSIS — I2699 Other pulmonary embolism without acute cor pulmonale: Secondary | ICD-10-CM | POA: Diagnosis not present

## 2023-03-19 DIAGNOSIS — M6281 Muscle weakness (generalized): Secondary | ICD-10-CM | POA: Diagnosis not present

## 2023-03-20 DIAGNOSIS — I2699 Other pulmonary embolism without acute cor pulmonale: Secondary | ICD-10-CM | POA: Diagnosis not present

## 2023-03-20 DIAGNOSIS — M6281 Muscle weakness (generalized): Secondary | ICD-10-CM | POA: Diagnosis not present

## 2023-03-21 DIAGNOSIS — M6281 Muscle weakness (generalized): Secondary | ICD-10-CM | POA: Diagnosis not present

## 2023-03-21 DIAGNOSIS — I2699 Other pulmonary embolism without acute cor pulmonale: Secondary | ICD-10-CM | POA: Diagnosis not present

## 2023-03-22 DIAGNOSIS — M6281 Muscle weakness (generalized): Secondary | ICD-10-CM | POA: Diagnosis not present

## 2023-03-22 DIAGNOSIS — I2699 Other pulmonary embolism without acute cor pulmonale: Secondary | ICD-10-CM | POA: Diagnosis not present

## 2023-03-23 DIAGNOSIS — M6281 Muscle weakness (generalized): Secondary | ICD-10-CM | POA: Diagnosis not present

## 2023-03-23 DIAGNOSIS — I2699 Other pulmonary embolism without acute cor pulmonale: Secondary | ICD-10-CM | POA: Diagnosis not present

## 2023-03-26 DIAGNOSIS — M6281 Muscle weakness (generalized): Secondary | ICD-10-CM | POA: Diagnosis not present

## 2023-03-26 DIAGNOSIS — I2699 Other pulmonary embolism without acute cor pulmonale: Secondary | ICD-10-CM | POA: Diagnosis not present

## 2023-03-27 DIAGNOSIS — I2699 Other pulmonary embolism without acute cor pulmonale: Secondary | ICD-10-CM | POA: Diagnosis not present

## 2023-03-27 DIAGNOSIS — M6281 Muscle weakness (generalized): Secondary | ICD-10-CM | POA: Diagnosis not present

## 2023-03-28 DIAGNOSIS — I2699 Other pulmonary embolism without acute cor pulmonale: Secondary | ICD-10-CM | POA: Diagnosis not present

## 2023-03-28 DIAGNOSIS — M6281 Muscle weakness (generalized): Secondary | ICD-10-CM | POA: Diagnosis not present

## 2023-03-29 DIAGNOSIS — I2699 Other pulmonary embolism without acute cor pulmonale: Secondary | ICD-10-CM | POA: Diagnosis not present

## 2023-03-29 DIAGNOSIS — M6281 Muscle weakness (generalized): Secondary | ICD-10-CM | POA: Diagnosis not present

## 2023-03-30 DIAGNOSIS — M6281 Muscle weakness (generalized): Secondary | ICD-10-CM | POA: Diagnosis not present

## 2023-03-30 DIAGNOSIS — I2699 Other pulmonary embolism without acute cor pulmonale: Secondary | ICD-10-CM | POA: Diagnosis not present

## 2023-04-02 DIAGNOSIS — I2699 Other pulmonary embolism without acute cor pulmonale: Secondary | ICD-10-CM | POA: Diagnosis not present

## 2023-04-02 DIAGNOSIS — M6281 Muscle weakness (generalized): Secondary | ICD-10-CM | POA: Diagnosis not present

## 2023-04-03 DIAGNOSIS — M6281 Muscle weakness (generalized): Secondary | ICD-10-CM | POA: Diagnosis not present

## 2023-04-03 DIAGNOSIS — I2699 Other pulmonary embolism without acute cor pulmonale: Secondary | ICD-10-CM | POA: Diagnosis not present

## 2023-04-04 DIAGNOSIS — I2699 Other pulmonary embolism without acute cor pulmonale: Secondary | ICD-10-CM | POA: Diagnosis not present

## 2023-04-04 DIAGNOSIS — M6281 Muscle weakness (generalized): Secondary | ICD-10-CM | POA: Diagnosis not present

## 2023-06-20 DIAGNOSIS — M6281 Muscle weakness (generalized): Secondary | ICD-10-CM | POA: Diagnosis not present

## 2023-06-20 DIAGNOSIS — I2699 Other pulmonary embolism without acute cor pulmonale: Secondary | ICD-10-CM | POA: Diagnosis not present

## 2023-06-21 DIAGNOSIS — M6281 Muscle weakness (generalized): Secondary | ICD-10-CM | POA: Diagnosis not present

## 2023-06-21 DIAGNOSIS — I2699 Other pulmonary embolism without acute cor pulmonale: Secondary | ICD-10-CM | POA: Diagnosis not present

## 2023-06-22 DIAGNOSIS — I2699 Other pulmonary embolism without acute cor pulmonale: Secondary | ICD-10-CM | POA: Diagnosis not present

## 2023-06-22 DIAGNOSIS — M6281 Muscle weakness (generalized): Secondary | ICD-10-CM | POA: Diagnosis not present

## 2023-06-25 DIAGNOSIS — M6281 Muscle weakness (generalized): Secondary | ICD-10-CM | POA: Diagnosis not present

## 2023-06-25 DIAGNOSIS — I2699 Other pulmonary embolism without acute cor pulmonale: Secondary | ICD-10-CM | POA: Diagnosis not present

## 2023-06-26 DIAGNOSIS — I2699 Other pulmonary embolism without acute cor pulmonale: Secondary | ICD-10-CM | POA: Diagnosis not present

## 2023-06-26 DIAGNOSIS — M6281 Muscle weakness (generalized): Secondary | ICD-10-CM | POA: Diagnosis not present

## 2023-06-27 DIAGNOSIS — M6281 Muscle weakness (generalized): Secondary | ICD-10-CM | POA: Diagnosis not present

## 2023-06-27 DIAGNOSIS — I2699 Other pulmonary embolism without acute cor pulmonale: Secondary | ICD-10-CM | POA: Diagnosis not present

## 2023-06-28 DIAGNOSIS — M6281 Muscle weakness (generalized): Secondary | ICD-10-CM | POA: Diagnosis not present

## 2023-06-28 DIAGNOSIS — I2699 Other pulmonary embolism without acute cor pulmonale: Secondary | ICD-10-CM | POA: Diagnosis not present

## 2023-06-29 DIAGNOSIS — M6281 Muscle weakness (generalized): Secondary | ICD-10-CM | POA: Diagnosis not present

## 2023-06-29 DIAGNOSIS — I2699 Other pulmonary embolism without acute cor pulmonale: Secondary | ICD-10-CM | POA: Diagnosis not present

## 2023-07-02 DIAGNOSIS — M6281 Muscle weakness (generalized): Secondary | ICD-10-CM | POA: Diagnosis not present

## 2023-07-02 DIAGNOSIS — I2699 Other pulmonary embolism without acute cor pulmonale: Secondary | ICD-10-CM | POA: Diagnosis not present

## 2023-07-03 DIAGNOSIS — M6281 Muscle weakness (generalized): Secondary | ICD-10-CM | POA: Diagnosis not present

## 2023-07-03 DIAGNOSIS — I2699 Other pulmonary embolism without acute cor pulmonale: Secondary | ICD-10-CM | POA: Diagnosis not present

## 2023-07-04 DIAGNOSIS — I2699 Other pulmonary embolism without acute cor pulmonale: Secondary | ICD-10-CM | POA: Diagnosis not present

## 2023-07-04 DIAGNOSIS — M6281 Muscle weakness (generalized): Secondary | ICD-10-CM | POA: Diagnosis not present

## 2023-07-05 DIAGNOSIS — I2699 Other pulmonary embolism without acute cor pulmonale: Secondary | ICD-10-CM | POA: Diagnosis not present

## 2023-07-05 DIAGNOSIS — M6281 Muscle weakness (generalized): Secondary | ICD-10-CM | POA: Diagnosis not present

## 2023-09-20 DIAGNOSIS — M6281 Muscle weakness (generalized): Secondary | ICD-10-CM | POA: Diagnosis not present

## 2023-09-20 DIAGNOSIS — I2699 Other pulmonary embolism without acute cor pulmonale: Secondary | ICD-10-CM | POA: Diagnosis not present

## 2023-09-21 DIAGNOSIS — I2699 Other pulmonary embolism without acute cor pulmonale: Secondary | ICD-10-CM | POA: Diagnosis not present

## 2023-09-21 DIAGNOSIS — M6281 Muscle weakness (generalized): Secondary | ICD-10-CM | POA: Diagnosis not present

## 2023-09-24 DIAGNOSIS — M6281 Muscle weakness (generalized): Secondary | ICD-10-CM | POA: Diagnosis not present

## 2023-09-24 DIAGNOSIS — I2699 Other pulmonary embolism without acute cor pulmonale: Secondary | ICD-10-CM | POA: Diagnosis not present

## 2023-09-25 DIAGNOSIS — M6281 Muscle weakness (generalized): Secondary | ICD-10-CM | POA: Diagnosis not present

## 2023-09-25 DIAGNOSIS — I2699 Other pulmonary embolism without acute cor pulmonale: Secondary | ICD-10-CM | POA: Diagnosis not present

## 2023-09-26 DIAGNOSIS — I2699 Other pulmonary embolism without acute cor pulmonale: Secondary | ICD-10-CM | POA: Diagnosis not present

## 2023-09-26 DIAGNOSIS — M6281 Muscle weakness (generalized): Secondary | ICD-10-CM | POA: Diagnosis not present

## 2023-09-27 DIAGNOSIS — M6281 Muscle weakness (generalized): Secondary | ICD-10-CM | POA: Diagnosis not present

## 2023-09-27 DIAGNOSIS — I2699 Other pulmonary embolism without acute cor pulmonale: Secondary | ICD-10-CM | POA: Diagnosis not present

## 2023-09-28 DIAGNOSIS — M6281 Muscle weakness (generalized): Secondary | ICD-10-CM | POA: Diagnosis not present

## 2023-09-28 DIAGNOSIS — I2699 Other pulmonary embolism without acute cor pulmonale: Secondary | ICD-10-CM | POA: Diagnosis not present

## 2023-10-01 DIAGNOSIS — M6281 Muscle weakness (generalized): Secondary | ICD-10-CM | POA: Diagnosis not present

## 2023-10-01 DIAGNOSIS — I2699 Other pulmonary embolism without acute cor pulmonale: Secondary | ICD-10-CM | POA: Diagnosis not present

## 2023-10-02 DIAGNOSIS — M6281 Muscle weakness (generalized): Secondary | ICD-10-CM | POA: Diagnosis not present

## 2023-10-02 DIAGNOSIS — I2699 Other pulmonary embolism without acute cor pulmonale: Secondary | ICD-10-CM | POA: Diagnosis not present

## 2023-12-13 DIAGNOSIS — M6281 Muscle weakness (generalized): Secondary | ICD-10-CM | POA: Diagnosis not present

## 2023-12-13 DIAGNOSIS — I2699 Other pulmonary embolism without acute cor pulmonale: Secondary | ICD-10-CM | POA: Diagnosis not present

## 2023-12-17 DIAGNOSIS — I2699 Other pulmonary embolism without acute cor pulmonale: Secondary | ICD-10-CM | POA: Diagnosis not present

## 2023-12-17 DIAGNOSIS — M6281 Muscle weakness (generalized): Secondary | ICD-10-CM | POA: Diagnosis not present

## 2023-12-19 DIAGNOSIS — M6281 Muscle weakness (generalized): Secondary | ICD-10-CM | POA: Diagnosis not present

## 2023-12-19 DIAGNOSIS — I2699 Other pulmonary embolism without acute cor pulmonale: Secondary | ICD-10-CM | POA: Diagnosis not present

## 2023-12-19 DIAGNOSIS — R41841 Cognitive communication deficit: Secondary | ICD-10-CM | POA: Diagnosis not present

## 2024-03-02 DIAGNOSIS — Z23 Encounter for immunization: Secondary | ICD-10-CM | POA: Diagnosis not present
